# Patient Record
Sex: Female | Born: 1960 | Race: White | Hispanic: No | Marital: Married
Health system: Western US, Academic
[De-identification: ages and names within clinical notes are randomized; demographics above are authoritative.]

## PROBLEM LIST (undated history)

## (undated) ENCOUNTER — Inpatient Hospital Stay: Payer: BLUE CROSS/BLUE SHIELD | Attending: Family

## (undated) DIAGNOSIS — M549 Dorsalgia, unspecified: Secondary | ICD-10-CM

## (undated) DIAGNOSIS — M542 Cervicalgia: Secondary | ICD-10-CM

## (undated) DIAGNOSIS — E785 Hyperlipidemia, unspecified: Secondary | ICD-10-CM

## (undated) HISTORY — DX: Dorsalgia, unspecified: M54.9

## (undated) HISTORY — DX: Cervicalgia: M54.2

## (undated) HISTORY — PX: NO PAST SURGERIES: SHX2092

## (undated) HISTORY — DX: Hyperlipidemia, unspecified: E78.5

---

## 2012-12-27 ENCOUNTER — Ambulatory Visit

## 2012-12-31 ENCOUNTER — Encounter: Payer: Self-pay | Admitting: Family Medicine

## 2012-12-31 ENCOUNTER — Ambulatory Visit: Admitting: Family Medicine

## 2012-12-31 VITALS — BP 160/80 | HR 64 | Temp 96.0°F | Resp 12 | Ht 64.0 in | Wt 133.0 lb

## 2012-12-31 DIAGNOSIS — L719 Rosacea, unspecified: Secondary | ICD-10-CM | POA: Insufficient documentation

## 2012-12-31 DIAGNOSIS — M5030 Other cervical disc degeneration, unspecified cervical region: Secondary | ICD-10-CM | POA: Insufficient documentation

## 2012-12-31 DIAGNOSIS — E538 Deficiency of other specified B group vitamins: Secondary | ICD-10-CM | POA: Insufficient documentation

## 2012-12-31 NOTE — Nursing Note (Signed)
>>   ANDREA MAYES     Fri Dec 31, 2012  1:00 PM  Vitals Taken. Gaspar Bidding, [facility]

## 2012-12-31 NOTE — Progress Notes (Signed)
SUBJECTIVE:  Rebecca Warner is a(n) 52yr old female who presents today, 12/31/2012  Chief complaint:  establish.     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  Patient had a neck flare in 2009 and had an MRI at that time.  Patient had a flare about 2 weeks ago and is having pain on the left side.  She has seen physical therapy twice and would like to continue.  No trauma.  Patient is having some radicular symptoms down the left trapezius.  Pain is worse with extension.   Pain is worse with driving and walking.   No incontinence of bowel and bladder.   Patient has been on aspirin and flexeril.       Patient Active Problem List   Diagnosis    Rosacea    Low vitamin B12 level    Degenerative cervical disc     Current Medications  Outpatient Prescriptions Marked as Taking for the 12/31/12 encounter (Office Visit) with Stark Bray, MD   Medication Sig Dispense Refill    Cholecalciferol, Vitamin D3, (VITAMIN D-3) 2,000 unit Tablet Take  by mouth.        Cyanocobalamin (VITAMIN B-12) 500 mcg Lozenge Take  by mouth.        Doxycycline (VIBRAMYCIN) 50 mg Capsule Take 1 capsule by mouth 2 times daily. take with 8 oz of water  20 capsule      PROGESTERONE MISC          No Facility-Administered Medications for the 12/31/12 encounter (Office Visit) with Stark Bray, MD.       Pcn (Penicillin) (Penicillins)    Rash   reports that she has never smoked. She has never used smokeless tobacco. She reports that  drinks alcohol. She reports that she does not use illicit drugs.  family history includes Hypertension in her paternal grandmother; Non-contributory in her father; and Thyroid in her mother.    REVIEW OF SYTEMS:  NEUROLOGY:  no headaches, no memory loss, no syncope  ENT:  no hearing problems, no epistaxis,  no rhinitis  PULMONARY:  no cough, no dyspnea  CARDIAC:  no chest pain, no edema  GASTROINTESTINAL:  no appetite changes, no abdominal pains,  no  constipation, no diarrhea  UROLOGIC:  No frequency, no hematuria  RHEUMATOLOGIC:  no joint pains  DERMATOLOGY:  no rashes  GENERAL:  no fevers     OBJECTIVE:  BP 160/80  Pulse 64  Temp(Src) 35.6 C (96 F) (Oral)  Resp 12  Ht 1.626 m (5\' 4" )  Wt 60.328 kg (133 lb)  BMI 22.82 kg/m2  LMP 12/07/2012  General:  --no jaundice  --no respiratory distress  --does not appear acutely ill  Ears:  --TMs and canals: normal  Oropharynx--no obvious tooth or gum problems                     --no pharyngeal inflammation  Face:--no skin rashes  Nose: No external abnormalities  Neck:  --Supple  --no thyromegaly  --no anterior cervical adenopathy, no supraclavicular adenopathy    CHEST:  Inspection:  --no deformities  Lungs:  --Lungs are clear to ausculation  Card:  --RRR without murmur  Neck:  --Inspection:  No rashes   --Palpation: + left paraspinal tenderness, no masses  --Range of Motion: decreased for most movement  --DTRs: 1+ right biceps and 0 left biceps  --strength: 5/5 throughout both both arms  MRI 2009  IMPRESSION:  1. Central disc extrusion with cephalad migration at C4 does  not result in cord compression. Uncovertebral  degeneration results in narrowing of the left neural  foramen.  2. Broad based left paracentral/foraminal disc protrusion at  C5-6 causes mild narrowing of the left neural foramen.No  cord compression.Mild spinal stenosis is present at this  level.  3. At C6-7, mild spinal stenosis results in diffuse disc  ridge complex.The neural foramina are mildly narrowed  bilaterally, as well.    ASSESSMENT AND PLAN:  Elevated BP--start home monitoring, follow up for CPE.   Consider diuretic if not improving    Cervical disc disease--physical therapy referral.  Follow up if not improving.  Patient wants to go to [facility] physical therapy and has a ppo.    hcm--follow up for CPE    Barriers to Learning: none    Stark Bray, M.D.  Family Physician  [facility]  [facility]

## 2013-02-14 ENCOUNTER — Telehealth: Payer: Self-pay | Admitting: Family Medicine

## 2013-02-14 ENCOUNTER — Ambulatory Visit

## 2013-02-14 NOTE — Telephone Encounter (Signed)
Pt notified with voice message as per Dr Mat Carne.  Asked that she call back to schedule appt.

## 2013-02-14 NOTE — Telephone Encounter (Signed)
Neck braces are not recommended as they lead to stiffness and weakness.  I recommend patient follow up with me to re-evaluate--please schedule a follow up appt

## 2013-02-14 NOTE — Telephone Encounter (Signed)
Patient called back in and stated she was returning our call and I made appointment for the patient for 5/6.    No actions needed.    Thanks,  Caprice Laqueta Due II  [facility]

## 2013-02-14 NOTE — Telephone Encounter (Signed)
Patient called regarding her neck injury she saw PCP for on 12/31/12. Patient stated she has been going to physical therapy since then. Patient stated her physical therapist suggested she walk to help build up the muscle by her holding her neck up. Patient has been walking and her neck is still bothering her while walking as she has to hold her neck up. Patient stated her pain is also usually on the left side of her neck but now it has began to bother her right side of her neck. Patient is wondering if it would be a good idea if she gets a neck brace while walking, she would also like to know if she should come in to be seen for the pain on her right side. Please advise.    Thank you,  Maree Krabbe

## 2013-02-15 ENCOUNTER — Encounter: Payer: Self-pay | Admitting: Family Medicine

## 2013-02-15 ENCOUNTER — Ambulatory Visit: Admitting: Family Medicine

## 2013-02-15 VITALS — BP 144/100 | HR 80 | Temp 98.0°F | Resp 16 | Wt 133.1 lb

## 2013-02-15 NOTE — Patient Instructions (Addendum)
MRI ordered, the phone number is [phone].  Patients need to call and schedule themselves.

## 2013-02-15 NOTE — Progress Notes (Signed)
SUBJECTIVE:  Rebecca Warner is a(n) 52yr old female who presents today, 02/15/2013  Chief complaint:  Neck pain.     History: patient has had chronic neck pains.  Patient had an MRI around 5 years ago.  Patient does feels better but is having pain down the left arm radiating down her 5th finger.  She has been having some pain on the right side.  Patient has been icing and doing traction and doing her exercises.   Patient has much stress.  Patient has been checking her BP and it has been around 130/80 at home.  Patient is taking her B12 daily    Patient Active Problem List   Diagnosis    Rosacea    Low vitamin B12 level    Degenerative cervical disc     Current Medications  Outpatient Prescriptions Marked as Taking for the 02/15/13 encounter (Office Visit) with Stark Bray, MD   Medication Sig Dispense Refill    Cholecalciferol, Vitamin D3, (VITAMIN D-3) 2,000 unit Tablet Take  by mouth.        Cyanocobalamin (VITAMIN B-12) 500 mcg Lozenge Take  by mouth.        PROGESTERONE MISC          No Facility-Administered Medications for the 02/15/13 encounter (Office Visit) with Stark Bray, MD.       Pcn (Penicillin) (Penicillins)    Rash     OBJECTIVE:  BP 144/100  Pulse 80  Temp(Src) 36.7 C (98 F) (Oral)  Resp 16  Wt 60.374 kg (133 lb 1.6 oz)  BMI 22.84 kg/m2  LMP 02/01/2013  General:  --no jaundice  --no respiratory distress  --does not appear acutely ill  Neck:  --Inspection:  No rashes   --Palpation: diffuse tenderness, no masses  --Range of Motion: supple with normal flexion and extension  --DTRs: symmetrical biceps  --strength: 5/5 throughout both both arms'    ASSESSMENT AND PLAN:  Chronic neck pain with radicular symptoms--I recommend patient get a repeat MRI and consider referral to pain management.     Elevated BP--check labs and follow up for CPE    Low b12--check labs    Barriers to Learning: none    Stark Bray, M.D.  Family Physician  [facility]  [place] [facility]

## 2013-02-16 ENCOUNTER — Encounter: Payer: Self-pay | Admitting: Family Medicine

## 2013-02-18 ENCOUNTER — Telehealth: Payer: Self-pay | Admitting: Family Medicine

## 2013-02-18 NOTE — Telephone Encounter (Signed)
I have not received any forms and no documentation of patient dropping off any forms.  Please advise. Littie Deeds, MA1

## 2013-02-18 NOTE — Telephone Encounter (Signed)
Left message to call.  OPERATOR: please relay message to patient.  I do not know of any other forms other than the physical therapy forms.  Thank you, Littie Deeds, MA1

## 2013-02-18 NOTE — Telephone Encounter (Signed)
Patient called and stated that she has seen Dr Mat Carne for an issue with her neck. She dropped off some forms about 2-3 weeks ago and would like to know what the status is of them. Stated that it was just documentation and forms stating that she has an injury. Please call and advise.     Carrington Clamp  [facility]

## 2013-02-18 NOTE — Telephone Encounter (Signed)
The only paperwork/forms we have for patient are for physical therapy and these were completed and faxed to [facility] physical therapy on 02/01/13.  No answer at the number provided, no voicemail.  Littie Deeds, MA1

## 2013-02-18 NOTE — Telephone Encounter (Signed)
See outside records in Media tab 4/23--I think this is what she is talking about

## 2013-02-21 NOTE — Telephone Encounter (Signed)
I spoke with Sedalia Muta, she does not have any forms for this patient.   Left message to call.   OPERATOR:  Please notify patient, we have no forms, other than the physical therapy forms.  Please ask patient to bring in forms today or tomorrow as Dr. Mat Carne is off 5/14-5/21/14.  Thank you, Littie Deeds, MA1

## 2013-02-21 NOTE — Telephone Encounter (Signed)
Patient called back stating that she came in about 2-3 weeks ago and dropped a packet off to [place] at front desk and was told she would give to physician. Patient states that if unable to find the forms to leave message on patients voicemail and patient will fax it over tomorrow morning.    Please call back please advise

## 2013-02-21 NOTE — Telephone Encounter (Signed)
Lyla Son, do you have these forms?  No telephone encounter that forms were received.  Littie Deeds, MA1

## 2013-02-22 NOTE — Telephone Encounter (Signed)
Form in MD inbox for completion/signature. Colbe Viviano, M.A. I

## 2013-02-22 NOTE — Telephone Encounter (Signed)
No answer at home number.  OPERATOR:  Please advise patient that the form we received today by fax was indeed the form Dr. Mat Carne completed and faxed on 02/02/13.  A copy of the form is at the front desk if patient wants a copy.  Littie Deeds, MA1

## 2013-02-22 NOTE — Telephone Encounter (Signed)
Patient called in and stated she was checking on her paperwork that she had faxed over and the following was advised:    -->Please advise patient that the form we received today by fax was indeed the form Dr. Mat Carne completed and faxed on 02/02/13. A copy of the form is at the front desk if patient wants a copy. Littie Deeds, MA1    Patient stated she verbally understood.    No other actions needed    Thanks,  Caprice Vickii Chafe  [facility]

## 2013-02-22 NOTE — Telephone Encounter (Signed)
I filled this out on 4/23--see the media section

## 2013-03-01 ENCOUNTER — Ambulatory Visit: Payer: Self-pay

## 2013-03-04 ENCOUNTER — Telehealth: Payer: Self-pay | Admitting: Family Medicine

## 2013-03-04 NOTE — Telephone Encounter (Signed)
Faxed. Nasser Ku, MA1

## 2013-03-04 NOTE — Telephone Encounter (Signed)
Please see message from 5/09:    Patient called stating that she would like to know if the MA can please send a fax of form that's at front desk ATTN: Beverlyn Roux Fax: [phone] as soon as possible since they told patient that they haven't received it yet, and patient will pick up hard copy this afternoon.    Please call back if needed

## 2013-03-15 ENCOUNTER — Ambulatory Visit: Payer: BC Managed Care – PPO | Admitting: Family Medicine

## 2013-03-15 ENCOUNTER — Other Ambulatory Visit: Payer: Self-pay | Admitting: Family Medicine

## 2013-03-15 ENCOUNTER — Telehealth: Payer: Self-pay | Admitting: Family Medicine

## 2013-03-15 NOTE — Telephone Encounter (Signed)
The spine clinic has orthopedic as well as neurosurgical spine surgeons.  Patient can request one or the other if she desires but they are all good.  Please notify

## 2013-03-15 NOTE — Telephone Encounter (Signed)
Patient notified, MRI report and sign up letter for MyChart at front desk.  Patient given number to spine clinic.  Littie Deeds, MA1

## 2013-03-15 NOTE — Telephone Encounter (Signed)
Patient called and stated that she received a call this morning about her MRI results. She would like a cope of the report at the front desk so that she can pick it up. She was advised that she was referred to a spine specialist. Would like to make sure that she is referred to a neurosurgeon. Please call and advise.     Carrington Clamp  [facility]

## 2013-03-25 ENCOUNTER — Ambulatory Visit: Payer: Self-pay

## 2013-04-07 ENCOUNTER — Encounter: Payer: Self-pay | Admitting: Family Medicine

## 2013-04-07 ENCOUNTER — Ambulatory Visit: Payer: BC Managed Care – PPO | Admitting: Family Medicine

## 2013-04-07 ENCOUNTER — Telehealth: Payer: Self-pay | Admitting: Family Medicine

## 2013-04-07 ENCOUNTER — Ambulatory Visit: Payer: Self-pay

## 2013-04-07 VITALS — HR 72 | Temp 98.7°F | Resp 18 | Wt 133.2 lb

## 2013-04-07 NOTE — Nursing Note (Signed)
>>   VIRGINIA BRADFORD, MA     Thu Apr 07, 2013  3:54 PM  Vitals taken, allergies verified, screened for pain. [place], [place]

## 2013-04-07 NOTE — Progress Notes (Signed)
SUBJECTIVE:  Rebecca Warner is a(n) 52yr old female who presents today, 04/07/2013  Chief complaint:  Neck pain.     History: patient with neck pain since last visit.  Patient has been to physical therapy.  Patient will be going to the spine clinic in a couple of weeks.  The pain was so severe she broke into tears.   Patient is having left arm weakness.  Patient has some right arm symptoms.   Patient only taking aspirin at this point.  No incontinence, gait is normal.  Patient states her physical therapy has noticed that her left arm is getting weaker    Patient Active Problem List   Diagnosis    Rosacea    Low vitamin B12 level    Degenerative cervical disc     Current Medications  Outpatient Prescriptions Marked as Taking for the 04/07/13 encounter (Office Visit) with Stark Bray, MD   Medication Sig Dispense Refill    B Complex Vitamins (B-50 COMPLEX) Tablet Take  by mouth every morning.  30 tablet      Cholecalciferol, Vitamin D3, (VITAMIN D-3) 2,000 unit Tablet Take  by mouth.        Cyanocobalamin (VITAMIN B-12) 500 mcg Lozenge Take  by mouth.        PROGESTERONE MISC          No Facility-Administered Medications for the 04/07/13 encounter (Office Visit) with Stark Bray, MD.       Pcn (Penicillin) (Penicillins)    Rash     OBJECTIVE:  BP   Pulse 72  Temp(Src) 37.1 C (98.7 F) (Oral)  Resp 18  Wt 60.419 kg (133 lb 3.2 oz)  BMI 22.85 kg/m2  LMP 04/07/2013  General:  --no jaundice  --no respiratory distress  --does not appear acutely ill  Neck:  --Inspection:  No rashes   --DTRs: symmetrical biceps  Right triceps 2+, left is 1+  --strength: 5/5 throughout both both arms    ASSESSMENT AND PLAN:  Cervical radiculopathy--will chenge referral to ASAP since patient is worried and worsening.  I discussed narcotic and muscle relaxants but patient declines--she states she does have a muscle relaxant at home and I encouraged her to use it for a few nights to see if she could get a good night's  sleep.    Barriers to Learning: none    Stark Bray, M.D.  Family Physician  [facility]  [place] [facility]

## 2013-04-07 NOTE — Telephone Encounter (Signed)
LEFT ARM NUMBNESS/LEFT ARM PAIN/UNABLE TO LIFE ARM

## 2013-04-07 NOTE — Telephone Encounter (Signed)
Rebecca Warner is a 52yr old female    3 patient identifiers used.    Per:   patient    Reason for Call:  Neck/arm problem    Symptoms:    Patient states she has history of cervical spine pain and has a "ruptured disc."  Patient states arm pain and numbness for last 4 months.  Patient states she has an appointment with the neurosurgeon on 7/14.  Patient reports having 50% decreased strength in her left tricep and is currently in Physical therapy.   Patient reports left arm with decreased strenght for the last 10 days. Last night patient reports it was difficult picking dinner plate with her left hand and plate felt very heavy.  Patient states she able to move all of her fingers and arm without difficulty.  Patient requesting to be seen by neurologist sooner.       Homecare and/or Medications given:  observation    Advice:  Appt made today for evaluation with PCP at 1545.  Advised patient to call back prior to appointment with any worsening symptoms or concerns.    Pain: yes    Pain location and 1-10:  arms           Disposition: Appointment given per [facility] Numbness and tingling protocol    Per:   patient verbalizes agreement to plan. Agrees to callback with any increase in symptoms/concerns or questions.     Gerre Couch RN  [facility] T[facility]

## 2013-04-08 ENCOUNTER — Telehealth: Payer: Self-pay | Admitting: Family Medicine

## 2013-04-08 MED ORDER — CARISOPRODOL 350 MG TABLET: ORAL_TABLET | ORAL | Status: AC

## 2013-04-08 MED ORDER — HYDROCODONE 5 MG-ACETAMINOPHEN 325 MG TABLET: 1.0000 | ORAL_TABLET | Freq: Four times a day (QID) | ORAL | Status: DC | PRN

## 2013-04-08 NOTE — Telephone Encounter (Addendum)
Prescriptions faxed.  Left message to call.  OPERATOR:  Please relay MD's message to patient.  Thank you, Littie Deeds, MA1

## 2013-04-08 NOTE — Telephone Encounter (Signed)
Patient called in stating she had an office visit with Dr. Mat Carne yesterday regarding pain in her neck. Patient explained she was asked if she wanted pain medication and declined, however patient would like to be given something for pain due to how severe the pain has gotten since appointment yesterday. Patient would like a call to see what medication would be prescribe and states she does not want anything that will make her 'loopy'.     Please call, Please fill    Thank you   Theador Hawthorne

## 2013-04-08 NOTE — Telephone Encounter (Signed)
Prescription in MD's pink folder to be signed.

## 2013-04-08 NOTE — Telephone Encounter (Signed)
Both the muscle relaxant (soma) and the narocotic can make patient loopy and it would be best to take them in the evening.  During the day patient can try ibuprofen (motrin) or aleve (naprosyn)  --please notify

## 2013-04-09 MED ORDER — HYDROCODONE 5 MG-IBUPROFEN 200 MG TABLET: 1.0000 | ORAL_TABLET | Freq: Three times a day (TID) | ORAL | Status: DC | PRN

## 2013-04-09 NOTE — Telephone Encounter (Signed)
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  She wants ibuprofen with codeine.     Prescription called in.

## 2013-04-11 ENCOUNTER — Ambulatory Visit: Payer: BC Managed Care – PPO | Admitting: Neurological Surgery

## 2013-04-11 VITALS — BP 159/91 | HR 85 | Temp 98.1°F | Resp 14 | Ht 64.0 in | Wt 135.1 lb

## 2013-04-11 NOTE — Nursing Note (Signed)
>>   Rebecca Warner     Mon Apr 11, 2013  9:42 AM  Identified patient using name and date of birth.  Vital signs were taken.  Screened for pain.  Allergies verified.  Pharmacy was updated.  Rebecca Karie Mainland) [place], [place] II

## 2013-04-11 NOTE — Progress Notes (Signed)
RE:  Rebecca, Warner  MR#:  [id number]  DOB:  1960/10/24  Date of Service:  04/11/2013      Stark Bray, MD  [address] [place], [place]  [address]    Dear Dr. Mat Carne:    We had the pleasure of examining your charming patient, Rebecca Warner.  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  Rebecca Warner had conservative measures which  have been only partially beneficial.  The discomfort is in the center  of the posterior cervical area radiating to the left upper extremity.  She is aware of weakness of the left triceps group.  She tilts her  head towards the right, opposite the extremity with discomfort.    The past medical history is remarkable for C-sections x2.  The patient  is allergic to penicillin.  She takes vitamins and aspirin per day.  The patient does not smoke cigarettes.    On physical examination, Rebecca Warner stands 5 feet 4 inches tall,  weighing 135 pounds, blood pressure is 159/91, the pulse 85,  respirations 14, temperature is 98.1.  The face is symmetric,  extraocular muscles are full.  Active range of motion of the cervical  spine is limited by discomfort with head turned tilting towards the  right.  There is no tenderness on palpation of the paraspinous  muscles.  Strength is 5/5 to the grips, interossei, wrist extensors  and biceps; triceps are 5/5 to the right, 4+/5 on to the left.  There  are 5/5 to the iliopsoas, quadriceps, dorsiflexors and plantar  flexors.  Deep tendon reflexes are trace throughout the upper  extremities without Hoffmann's reflexes. The Babinski signs are absent  to the right and left.  The Romberg test is negative.  The tandem gait  is stable.    Rebecca Warner had a magnetic resonance scan on 03/12/2013 examined by Dr.  Selena Batten and demonstrated to the patient and her husband these reveal a  left-sided protruding disc at the C5-C6 level with a central larger  disk  herniation at the C6-C7 level producing bilateral and central  stenosis.  Discussion was entertained of conservative versus operative  treatments.  The patient is recommended to conservative treatment at  the present time.  She is provided with an urgent referral to the Pain  Service for epidural steroid injections.  She is also recommended to  have four-view cervical x-rays today which will be reviewed next at  her appointment in three months.    Sincerely yours,      Report Electronically Signed - 04/15/2013 10:45:21 by  Hazeline Junker, MD  Clinical Fellow  Neurosurgery    Report Electronically Signed - 04/13/2013 09:46:14 by  Fernande Bras, MD  Associate Professor and Chief Of Spinal Neurosurgery        PR/hh  D:  04/11/2013 11:12:19 PDT/PST  T:  04/11/2013 16:47:41 PDT/PST  Job #:  [id number] / [id number]

## 2013-04-25 ENCOUNTER — Ambulatory Visit: Payer: BC Managed Care – PPO | Admitting: Neurological Surgery

## 2013-04-28 ENCOUNTER — Ambulatory Visit: Payer: BC Managed Care – PPO | Attending: ANESTHESIOLOGISTS | Admitting: ANESTHESIOLOGISTS

## 2013-04-28 ENCOUNTER — Encounter: Payer: Self-pay | Admitting: ANESTHESIOLOGISTS

## 2013-04-28 VITALS — BP 164/84 | HR 75 | Temp 98.4°F | Resp 16 | Ht 63.75 in | Wt 136.0 lb

## 2013-04-28 DIAGNOSIS — M5412 Radiculopathy, cervical region: Secondary | ICD-10-CM | POA: Insufficient documentation

## 2013-04-28 NOTE — Progress Notes (Signed)
[facility]  Department of Anesthesiology and Pain Medicine  [address]  [place], [place] [address]  Phone: [phone]    Fax: [phone]    Date: April 28, 2013    Re: Rebecca Warner  MR#: [id number]  Date of Birth: 1961/07/22  Age: 38yr    Requesting physician: Stark Bray, MD    Below is the summary of the visit with our mutual patient, Rebecca Warner. Thank you for your cooperation in her care.     Assessment and Differential Diagnosis:  Rebecca Warner was seen today for arm pain.    Diagnoses and associated orders for this visit:    Cervical radicular pain           Encounter Summary:  Rebecca Warner is a 52yr -old female with acute onset of left upper extremity pain 12/15/12.  There was no inciting event that the patient recalls.  The patient notes that there was moderate improvement with physical therapy until 03/27/13 when it worsened greatly.  The patient states that she "woke up with it-acute onset on 12/15/12.".  The patient states "I wonder if the extended disc moved or enlarged at that time."      RECOMMENDATIONS/TREATMENT PLAN:     1. cervical epidural steroid injection next avail PK        Dear Dr. Stark Bray, MD    It was a pleasure to see your patient, Rebecca Warner, today in consultation at the [facility], [facility].  As you know, Rebecca Warner is a 52yr -old female with a chief complaint of:    Chief Complaint   Patient presents with    Arm Pain     left       Rebecca Warner is here for a new problem of left upper extremity pain, which started 4 month(s) ago. She associates the onset of pain with no known inciting event or injury.    She notes pain located in the areas indicated in the pain location diagram below.     Pain Location Diagram        The pain is present frequently (75% of the time)    Rebecca Warner describes the pain as burning, pins and needles, sharp, numbness, shooting, cutting and  pressure    Pain Intensity (VAS): 0-10 Scale (0= No Pain, 10= Worst Imaginable Pain)  She states that the pain intensity is:   Currently a VAS of 8/10.     The average pain for the last week has been a VAS of 8/10.     At its best, it is 3/10;    At its worst, 9/10.    RELIEVING AND AGGRAVATING FACTORS   Her pain is relieved by medication.   Her pain is aggravated by sitting, coughing/sneezing.   Her pain is unchanged by relaxation, thinking about something else, urination, bowel movements.      FUNCTIONAL LIMITATIONS:   Rebecca Warner states that pain   does not interfere with caring for self;   occasionally interferes with going to work, exercising or recreation, sleeping;    often interferes with performing household chores, shopping, socializing with friends, exercising or recreation, driving;    and completely interferes with doing yard work.    Currently Rebecca Warner is    able to walk a mile or more, before pain becomes the limiting factor;    sit for 1  hours before pain becomes the limiting factor;  and stand for 2 hours before pain becomes the limiting factor.      Rebecca Warner is forced to lie down occasionally because of pain.    Outpatient Prescriptions Marked as Taking for the 04/28/13 encounter (Office Visit) with Carmelina Dane, MD   Medication Sig Dispense Refill    B Complex Vitamins (B-50 COMPLEX) Tablet Take  by mouth every morning.  30 tablet      Carisoprodol (SOMA) 350 mg Tablet 1-2 tabs po qhs prn  20 tablet  0    Cholecalciferol, Vitamin D3, (VITAMIN D-3) 2,000 unit Tablet Take  by mouth.        Cyanocobalamin (VITAMIN B-12) 500 mcg Lozenge Take  by mouth.        Hydrocodone-Ibuprofen (VICOPROFEN) 7.5-200 mg per tablet Take 1 tablet by mouth every 8 hours if needed for pain.  30 tablet  0    PROGESTERONE MISC          No Facility-Administered Medications for the 04/28/13 encounter (Office Visit) with Carmelina Dane, MD.       The patient is currently not taking opioid  medications.    STOPPED PAIN MEDICATIONS:   Med #1 - Vicodin, reason for stopping - Didn't help    Allergies   Allergen Reactions    Acetaminophen Other-Reaction in Comments     No allergy; patient refuses to take.    Pcn (Penicillin) (Penicillins) Rash       PREVIOUS DIAGNOSTIC STUDIES:   Images: The report of the image indicated below was reviewed. and The image noted below was viewed.  MRI of cervical spine      OTHER PAIN PROBLEMS:  The patient does NOT report pain in other areas.    LEGAL ISSUES:    Rebecca Warner is not currently involved in litigation pertaining to the pain complaint.     She does not have a prior history of arrests or legal problems.     She has not filed a Radiation protection practitioner related to the pain complaint.    PSYCHOLOGICAL HISTORY AND TREATMENTS:      Rebecca Warner has not been evaluated or treated by a psychiatrist, psychologist, or counselor prior to todays visit.       DEPRESSION SCALE (Patient Health Questionnaire PHQ-9)  1. Little interest or pleasure in doing things: 0  2. Feeling down, depressed, or hopeless: 0  3. Trouble falling or staying asleep, or sleeping too much: 0  4. Feeling tired or having little energy: 0  5. Poor appetite or overeating: 0  6. Feeling bad about yourself - or that you are a failure or have let yourself or your family down: 0  7. Trouble concentrating on things, such as reading the newspaper or watching television: 0  8. Moving or speaking so slowly that other people could have noticed. Or the opposite - being so figety or restless that you have been moving around a lot more than usual: 0  9. Thoughts that you would be better off dead, or of hurting yourself: 0  If >  1: Please notify a mental health provider or the patients primary care provider with any concerns about acute risk for suicide.   10. If you checked off any problems, how difficult have these problems made it for you to do your work, take care of things at home, or get along with other  people?: Not difficult at all  PHQ9 Total: 0     PHQ9 Affective-Cognitive Total: 0/15  PHQ9 Physical Total: 0/12        Total Score Depression Severity   1-4 Minimal Depression   5-9 Mild Depression   10-14 Moderate Depression   15-19 Moderately Severe Depression   20-27 Severe Depression        She does not have a past history of suicide attempts.     Rebecca Warner psychological, depression, and suicidality history were reviewed.  The patient appears to be at low acute risk for suicide or self harm at this time.    EFFECT OF PAIN ON EMPLOYMENT   Rebecca Warner employment status (see below) has not  been affected by the present pain condition.     She is not currently unemployed due to the painful condition.    PAST MEDICAL, SURGICAL, FAMILY, SOCIAL HISTORY, and REVIEW OF SYSTEMS:      Past Medical History   Diagnosis Date    Back pain        Past Surgical History   Procedure Laterality Date    Pr cesarean delivery only       C-section, low cervical x 2       Family History   Problem Relation Age of Onset    Thyroid Mother      goiter as a child    Non-contributory Father     Hypertension Paternal Grandmother     Cancer Maternal Grandmother     Heart Paternal Grandfather          PSYCHOSOCIAL HISTORY:     History     Social History    Marital Status: MARRIED     Spouse Name: N/A     Number of Children: 2    Years of Education: N/A     Occupational History    Health visitor      Social History Main Topics    Smoking status: Never Smoker     Smokeless tobacco: Never Used    Alcohol Use: Yes      1 glass at night    Drug Use: No    Sexually Active: Not on file     Other Topics Concern    Not on file     Social History Narrative    PREVIOUS TREATMENTS:     Excellent pain relief was achieved with none.    Moderate pain relief was achieved with traction, physical therapy.    No pain relief was achieved with traction, physical therapy, exercise.        PAST MEDICAL, SOCIAL, AND FAMILY HISTORY:      Review of  Systems:         was asked to report if he was experiencing any of the following:        fever or chills     unplanned weight loss     double or blurred vision     hearing loss     difficulty swallowing     bleeding gums     low platelet count     heat intolerance     cold intolerance     thyroid problems     skin rash     shortness of breath     wheezing     palpitations (awareness of fast heart)     chest pain     constipation     abdominal pain     nausea    vomiting     diarrhea     sexual dysfunction     urinary  retention or difficulty urinating     back pain     neck pain    joint pain     muscle pain     loss of consciousness or blackouts     memory loss     muscle weakness     seizures     trouble walking     dizziness     drowsiness      excessive fatigue     difficulty falling or remaining asleep     loss of interest in hobbies or other activities     difficulty concentrating     feelings of guilt     feeling depressed         THE PATIENT ADMITS TO THE FOLLOWING OF THE ABOVE LISTED SYMPTOMS:  neck pain        All other systems were negative.        The Review of Systems was reviewed.                FAMILY LIVING CIRCUMSTANCES:          The patient is currently lives with their spouse                           Rebecca Warner past medical, family, and social history were reviewed.    PHYSICAL EXAM:    Filed Vitals:    04/28/13 1015   BP: 164/84   Pulse: 75   Temp: 36.9 C (98.4 F)   TempSrc: Oral   Resp: 16   Height: 1.619 m (5' 3.75")   Weight: 61.689 kg (136 lb)   SpO2: 99%     Constitutional: Normally developed, no deformities,well groomed., healthy, alert, no distress, pleasant affect, cooperative, skin warm, dry, and pink  Skin:  Skin color, texture, turgor normal. No rashes or lesions.  Eyes:  conjunctivae and corneas clear. PERRL, EOM's intact. sclerae normal.  Ears:  external inspection of ears show no abnormality.  Nose:  normal.  Mouth: normal.  Respiratory: clear to auscultation.  Cardiovascular:   normal rate and regular rhythm, no murmurs, clicks, or gallops.  GI: BS normal.  Abdomen soft, non-tender.  No masses or organomegaly.  Musculoskeletal: Gait/Station is not antalgic, Patient is able to heel walk and is able to toe walk  Neuro: Movement Assessment Upper Limb  Bulk:   normal  Tone:  normal  Abnormal Movements:  no    Strength Movement Root Nerve Muscle   Bilateral 5 Shoulder abduction C5/6 Axillary Deltoid   Bilateral 5 Elbow flexion C5/6 Musculocut., Radial Biceps, Brachioradialis   Bilateral 5 Elbow extension C6/7/8 Radial Triceps   Bilateral 5 Wrist extension C5/6 Radial Ext. carpi rad. longus   Bilateral 5 Finger extension C7/8 Post. Interos. N. (radial) Ext. dig. comm.   Bilateral 5 Finger flexion (index) C7/8 Ant. Interos. N. (median) Flexor dig. Prof. (index)   Bilateral 5 Finger flexion (ring, little) C7/8 Ulnar Flex. dig. prof. (ring + little)   Bilateral 5 Finger abduction C8/T1 Ulnar 1st dorsal interos.   Bilateral 5 Thumb abduction C8/T1 Median ABD. Pol. brevis       Upper extremity reflexes: biceps - bilateral 2+  brachioradialis - bilateral 2+  triceps - bilateral 2+  Hoffman's sign is not present bilateral ; Wrist clonus is not present bilateral .  Sensory Assessment:  Sensory:  bilateral  upper extremities are intact and symmetrical light touch, position.  There is no .  allodynia in the bilateral upper extremity  Psych:Attention Span: good  Speech: normal volume, rate, and pitch  Mood (pt's report) :Mood pt's report, euthymic  Affect: full and appropriate  Cervical Spine:range of motion is full with flexion and is associated with no change in pain.  range of motion is severely restricted with extension and is associated with increase in pain.  Myofascial exam:The patient did  have myofascial tenderness in the left cervical paraspinous muscle regions    MEDICAL DECISION MAKING     Records Reviewed:   Images viewed/reviewed above were important and necessary because subsequent medical  and treatment recommendations required review of the above image(s)  The report by Dr. Selena Batten dated June 2014 was reviewed.  This was important and useful because the reviewed note clarified the reasons for the recommended treatment, subsequent medical recommendations were based upon the record review and the patient's history was corroborated in the reviewed note.     Review of the Risk of Comorbidities:  At this juncture, we believe that the patient's constitutional status adds moderate risk and complexity to our proposed evaluation and treatment.  High Risk procedures ordered:  Cervical Injection   Other considerations in this patient's management include Rosacea    ASSESSMENT AND RECOMMENDATIONS/TREATMENT PLAN:   Please see the beginning of the note.       I saw and evaluated the patient myself.  The patient was instructed and educated on all aspects of the plan of care.  The patient acknowledged the plan of care.    Carmelina Dane, MD  Attending Physician  Department of Anesthesiology & Pain Medicine    Note Electronically Signed By:  Carmelina Dane, MD

## 2013-04-28 NOTE — Nursing Note (Signed)
>>   Rebecca Warner     Thu Apr 28, 2013 10:16 AM  Identified patient using name and date of birth.  Vital signs were taken.  Screened for pain.  Allergies verified.  Pharmacy was updated.  Rebecca Karie Mainland) [place], [place]

## 2013-05-11 ENCOUNTER — Ambulatory Visit: Payer: BC Managed Care – PPO | Attending: ANESTHESIOLOGISTS | Admitting: ANESTHESIOLOGISTS

## 2013-05-11 VITALS — BP 181/90 | HR 88 | Temp 98.1°F | Resp 16 | Ht 64.0 in | Wt 134.0 lb

## 2013-05-11 DIAGNOSIS — M5020 Other cervical disc displacement, unspecified cervical region: Secondary | ICD-10-CM | POA: Insufficient documentation

## 2013-05-11 NOTE — Progress Notes (Signed)
Informed Consent:  The patient's condition and proposed procedures, risks, and alternatives were discussed with the patient or responsible party.  The patient's / responsible party's questions were answered.   The patient / responsible party appeared to understand and chose to proceed.  Informed consent was obtained.    Procedural Pause:  A procedural pause verifying correct patient, medical record number, allergies, and surgical site was performed immediately prior to beginning the procedure.    I Hilbert Bible, MD) performed the entire procedure on the date indicated in Dr. James Ivanoff, the pain fellow's, note.  Please see EMR note for details.     I Hilbert Bible, MD) evaluated and examined the patient with the pain fellow on the date indicated in the fellow's note.  We developed the plan together as outlined in the pain fellow's note.   The patient was instructed and educated on all aspects of the plan of care. The patient acknowledged the plan of care.    Carmelina Dane, MD  Attending Physician  Department of Anesthesiology & Pain Medicine  Note Electronically Signed By:  Karmen Bongo, MD  Medical Director, Division of Pain Medicine  Professor of Anesthesiology and Pain Medicine

## 2013-05-11 NOTE — Procedures (Signed)
Procedure:  Cervical, C7-T1, Midline Interlaminar Epidural Steroid Injection with Fluoroscopy    Epidural Steroid Injection # 1  Series # 1    Postprocedural Diagnosis: Same    Anesthesia - None  IV Fluids - None  Tuohy Needle Type: 20 gauge 3.5 inch Tuohy  Loss of Resistance Depth - 4.5 cm  Contrast Dye - iohexol 180 mgI/ml - 1 ml   Injected Steroid Solution: triamcinolone 40 mg and pf normal saline 1.5 ml  Additional Medications Administered: none    Estimated Blood Loss - <2 ml  Drains: None  Specimens Removed: None  Urine Output - Not Measured  Complications: no apparent complications  Outcome: good    Informed Consent:  The patient's condition and proposed procedures, risks, and alternatives were discussed with the patient or responsible party.  The patient's / responsible party's questions were answered.   The patient / responsible party appeared to understand and chose to proceed.  Informed consent was obtained.    After obtaining written consent, an IV hep lock was placed. (See nurses notes for details). The patient was taken back to the fluoroscopy suite and placed in a prone position with a pillow under the chest to decrease lordosis.  The skin overlying the injection site was prepped and draped in an aseptic fashion. The target vertebral interspace (see above) was identified by AP fluoroscopy.     Procedural Pause:  A procedural pause verifying correct patient, medical record number, allergies, and surgical site was performed immediately prior to beginning the procedure.    The skin and subcutaneous tissue overlying the target site of injection was anesthetized using 4 ml of 1% lidocaine MPF with a 25-gauge, 1 -inch needle.     The above noted Tuohy needle was advanced under fluoroscopic guidance towards the epidural space. Lateral fluoroscopic imaging was used to confirm depth. The epidural space was identified using a loss of resistance to air technique. (See above for loss of resistance depth). A  catheter was not placed. A microbore extension tubing was attached to the needle to minimize any movement of the needle during injection or syringe change.  After negative aspiration for heme or CSF, the above noted contrast was injected. An epidurogram was confirmed using AP and lateral fluoroscopy. After repeat negative aspiration for heme or CSF, the above noted steroid solution was slowly injected in increments. The needle was then retracted approximately halfway and the needle track was flushed with 0.5 ml of 1% lidocaine MPF to clear the needle prior to removal. The Tuohy needle was then removed. A sterile bandage was placed over the injection site.    The heart rate, pulse oximetry, and blood pressure were continuously monitored throughout the procedure.  The patient tolerated the procedure well. She was carefully escorted to the recovery room in stable condition.  After meeting discharge criteria, the patient was discharged home.    DISCUSSION:  Conditions of the spine that result in spinal nerve root irritation such as disc protrusions, spinal stenosis, or post surgical radiculitis often respond favorably to epidural steroid injections.  The goal in performing epidural steroid injections is to provide relief from pain and permit greater function.     Today we performed an interlaminar epidural steroid injection.  The patient was informed that it may take several days for the epidural steroid medication to reach its full efficacy and she should continue her regular pain medications as prescribed.    The patient was advised to relax and avoid any heavy  lifting or excessive bending for the rest of the day.  She was advised that she may return to her usual activities tomorrow if she is otherwise feeling well.  The patient was advised not to bathe or soak in water for 24 hours but that showering would be acceptable.    The patient was instructed that if she experienced fever or chills, new weakness, new sensory  changes, any changes in bowel or bladder habits, worsening back pain, new headache, neck stiffness, or other new symptoms, that she should contact the pain clinic immediately or dial 911 if unable to reach the pain clinic.   The patient has agreed not to travel out of the area for the next 4 days following the procedure so that she can be reevaluated if necessary.    RECOMMENDATIONS:  1. Repeating the injection may provide improved and/or prolonged pain relief and allow for better functional outcomes.  We will plan to have the patient follow up in 4-6 weeks for a repeat epidural steroid injection. If the patient is doing well prior to the next scheduled injection, the patient will contact us and reschedule to a later date..   2. The patient has agreed not to travel out of the area for the next 4 days following the procedure so that she can be reevaluated if necessary.  3. No medications were prescribed at today's visit.  4. Additional recommendations: None    The patient was instructed and educated on all aspects of the plan of care.  The patient acknowledged the plan of care.  I saw and evaluated the patient with my attending physician, Dr. Rockwell Germany, DO  Fellow  Department of Anesthesiology & Pain Medicine    Note Electronically Signed By:  Geri Seminole, DO

## 2013-05-11 NOTE — Patient Instructions (Addendum)
PAIN MANAGEMENT CLINIC  AFTER VISIT INSTRUCTIONS      Pain Clinic [phone] or [phone]: available during business hours, 8 am - 5 pm    On-call physician [phone]: available after business hours, 7 days per week including holidays).          Procedure Performed Today:     Epidural Steroid - Cervical  Fluoroscopy yes    If you experience any of the following symptoms after your procedure, please notify our office or on-call physician immediately (see above for contact information):    fever (increased oral temperature)    bleeding or swelling at the injection site,     drainage, rash or redness at the injection site     possible signs of infection     increased pain at the injection site    worsening of your usual pain    severe headache    new or worsening numbness     new arm and/or leg weakness, or     changes in bowel and/or bladder function: urinating or defecating on yourself and not knowing that you did it.      PLEASE FOLLOW ALL INSTRUCTIONS CAREFULLY      Do not drive or operate heavy machinery for 12 hours.    Do not engage in strenuous activity (e.g., lifting or pushing heavy objects or repeated bending) for 24 hours.      Do not take a bath, swim or use Jacuzzi for 24 hours after procedure. (A shower is fine).    Remove any Band-Aids when you get home.     Use cold/ice, as needed for comfort.  We recommend the use of cold therapy alternating on for 20 minutes, off for 20 minutes.     Do not apply direct heat (heating pad or heat packs) to the injection site for 24 hours.      Resume your usual medications, unless instructed otherwise by your Pain Physician.      If you are on warfarin (Coumadin) or other blood thinner, resume this medication as instructed by your prescribing Physician.    Keep a record of your response to the injection you had today.      How much relief did you get?    Were you able to decrease the  use of any of your pain medications?    Were you able to increase your level of activity?    How long did the relief last?     NOTE:  If your Pain Physician ordered a procedure, lab, diagnostic study (x-ray, MRI, CT, ultrasound, EMG, EKG, etc.), or specialty referral for you today, please be aware that some insurance carriers may require authorization before they can be scheduled. If these cannot be scheduled at the time of discharge today, you will be contacted with the scheduled appointment.  If you are not contacted within 3-5 business days, please call our office at [phone].          Procedure To Schedule FOR YOUR Next Visit: Epidural Steroid - Cervical  Fluoroscopy yes      NOTE: Please be aware of which location you are scheduled for your next appointment. The [facility] has 2 locations in [place]: (1) the [facility] at the [facility] and (2) the [facility] at the [facility]. If you have any questions regarding the location of your next appointment, please call our office: [phone].      PLEASE FOLLOW ALL INSTRUCTIONS CAREFULLY    [facility]  Patient Pre-Procedure Instructions   CAUTION FAILURE TO FOLLOW THESE PRE-PROCEDURE INSTRUCTIONS WILL RESULT IN YOUR PROCEDURE   BEING RESCHEDULED.   PREGNANCY If you are pregnant, or think you may be pregnant, please notify our staff.  This may or may not affect the ability to perform the procedure.    DRIVER You MUST have a DRIVER TO TAKE YOU HOME after your procedure.    You must provide your drivers full name and contact number at time of check-in.   FASTING  PROTOCOL You may have nothing to eat for 8 hours prior to arrival at clinic.  Broth and candy are considered solid food and require an eight hour fast.  You may have CLEAR liquids up to 2 hours prior to arrival at clinic.   Clear liquids include water,  clear fruit juice (no pulp), carbonated beverages, ice, black coffee, black tea (no milk or cream), chewing gum (un-swallowed), and/or clear jello (no fruit or milk).  No alcohol containing beverages.    BLOOD  PRESSURE  MEDICATIONS If you take blood pressure medication do not stop.  Take your BP medications as usual the morning of your procedure with a sip of water at least 2 hours prior to arrival.   BLOOD  THINNERS If you are taking daily aspirin, Plavix, or other blood thinners such as Coumadin/Warfarin, we will need your prescribing doctor to sign a release permitting you to stop these medications.  Once approved by your prescribing doctor - Stop All blood thinners based on the time table below prior to your procedure.  If you have been instructed to stop warfarin (coumadin), you must have an INR lab drawn the day before your procedure. Your INR must be within normal limits before we can perform your injection. If you do not use a [facility] laboratory, please have the lab fax your results to the Pain Clinic: [phone]. Medications can be restarted after your procedure.    14 day hold                                         7  DAY HOLD                    Ticlid (Ticlopidine)                        Anacin, Bufferin, Ecotrin, Excedrin,                                                                   Aggrenox (Aspirin)   10 DAY HOLD                            Brilinta  (Ticagrelor)   Effient (Prasugel)                       Coumadin (Warfarin)                                                          Pradaxa (Dabigatran)                                                        Elmiron (Pentosan)                                                        Plavix (Clopidogrel Bisulfate)                                                        Pletal (Cilostazol)                                              3 DAY HOLD                                                        24 HOUR HOLD  Xarelto (Rivaroxaban)                                          Lovenox (Enoxaparin)                                                                                 Agrylin (Anagrelide)     Non-steroidal  Anti-inflammatories  (NSAIDs) DO NOT TAKE any non-steroidal anti-inflammatory medications (NSAIDS), listed in the time table below.  Medications can be restarted after your procedure.  Celebrex is OK to take and does not need to be discontinued.  Medications to stop:  3 DAY HOLD                                         7 DAY HOLD  Advil, Motrin, (Ibuprofen)                       Aleve (Naproxen sodium)                           Arthrotec (diclofenac sodium/              Darvon  compound (contains aspirin)         misoprostol)                                           Naprosyn (Naproxen)        Clinoril (Sulindac)                                  Norgesic Forte (contains aspirin)  Indocin (Indomethacin)                          Mobic (Meloxicam)                 Lodine (Etodolac)                                 Oruvail (Ketoprofen)                                   Toradol (Ketorolac)                                Percodan (contains aspirin)       Vicoprofen (Hydrocodone and             Relafen (Nabumetone)                                      Ibuprofen)                                               Salsalate       Voltaren (Diclofenac)                             Trilisate                                                                                                                 Vitamin E (more than 400mg per day)                                                                   Any medication containing aspirin            14 DAY HOLD                                                Daypro (Oxaprozin)           Feldene (Piroxicam)               ANTIBIOTICS If you are currently taking antibiotics, you must complete the entire dosage 7 days prior to your scheduled procedure.  You must be clear of any signs or symptoms of infection.  If you begin antibiotics,  please contact our clinic for instructions.   TRAVEL AFTER  PROCEDURE You cannot travel out of town for 4 days after your procedure.   FEVER, CHILLS   OR RASH If you experience any fever, chills, rash, or open wounds during the one week prior to your procedure please call the Pain Clinic.   *[facility] Point  Injections Need Driver Only.   You may take your medications (including blood pressure, blood thinners, aspirin and NSAIDS), eat, and drink as usual.     If you are SICK, it is not safe to do your procedure. Therefore, please call us as soon as possible to reschedule. Since we do keep a waiting list, your courtesy will allow us to schedule another patient. Please, call our office if you have any questions.    If you are NOT IN PAIN OR HAVE MINIMAL PAIN, please call to postpone your procedure (unless otherwise instructed by your Pain Physician).        MEDICATIONS    If your Pain Physician prescribes medications for you, please anticipate when you will require a refill.  It can take 1-3 business days to complete the process. Contact your pharmacy for medication refills. Your pharmacy will contact the Pain Clinic office with detailed medication information.         Driving When You Are Taking Medications    For most people, driving represents freedom, control and independence. Driving enables most people to get to the places they want or need to go. For many people, driving is important economically - some drive as part of their job or to get to and from work.   Driving is a complex skill. Our ability to drive or operate heavy or dangerous machinery safely can be affected by changes in our physical, emotional and mental condition. The goal of this brochure is to help you and your health care professional talk about how your medications may affect your ability to drive safely.     How can medications affect my driving or ability to operate heavy or  dangerous machinery?   People take medications for a variety of reasons. Those can include:          allergies          anxiety          colds          depression          diabetes          heart and cholesterol conditions          high blood pressure          muscle spasms          pain          Parkinson's disease          schizophrenia   Medications may be prescribed by your doctor or purchased over-the-counter without a doctor's prescription. Many individuals also take herbal supplements. Some of these medications and supplements may cause a variety of reactions that may make it more difficult for you to drive a car or operate heavy or dangerous machinery safely. These reactions may include:            sleepiness          blurred vision          dizziness          slowed movement          fainting          inability to focus or pay attention          nausea   Often people take more than one medication at a time. The combination of different medications can cause problems for some people. This is especially true for older adults because they take more medications than any other age group. Due to changes in the body as people age, older adults are more prone to medication related problems. The more medications you take, the greater your risk that your medicines will affect your ability to drive safely. To help avoid problems, it is important that at least once a year you talk to your doctor or pharmacist about all the medications - both prescription and over-the-counter - you are taking. Also let your professional know what herbal supplements, if any, you are taking. Do this even if your medications and supplements are not currently causing you a problem.     Can I still drive or operate heavy or dangerous machinery safely if I am taking medications?     Yes, most people can drive or operate heavy or dangerous machinery safely if they are taking medications. It depends on the effect those medications - both  prescription and over-the-counter - have on your coordination and mental function. In some cases you may not be aware of the effects. But, in many instances, your doctor can help to minimize the negative impact of your medications on your driving or operate heavy or dangerous machinery in several ways. Your doctor may be able to:          adjust the dose;          adjust the timing of doses or when you take the medication;          add an exercise or nutrition program to lessen the need for medication; and          change medication to one that causes less drowsiness.     What can I do if I am taking medications?   Talk to your doctor honestly.   When your doctor prescribes a medicine for you, ask about side effects. How should you expect the medicine to affect your ability to drive or operate heavy or dangerous machinery? Remind your doctor of other medications - both prescription and over-the-counter - and herbal supplements you are taking, especially if you see more than one doctor. Talking honestly with your doctor also means telling the doctor if you are not taking all or any of the prescribed medication. Do not stop taking your medication unless your doctor tells you to.   Ask your doctor if you should drive - especially when you first take a medication.   Taking a new medication can cause you to react in a number of ways. It is recommended that you do not drive or operate heavy or dangerous machinery when you first start taking a new medication until you know how that drug affects you. You also need to be aware that some over-the-counter medicines and herbal supplements can make it difficult for you to drive or operate heavy or dangerous machinery safely.     Talk to your pharmacist.   Get to know   your pharmacist. Ask the pharmacist to go over your medications with you and to remind you of effects they may have on your ability to drive or operate heavy or dangerous machinery safely. Be sure to request printed  information about the side effects of any new medication. Remind your pharmacist of other medicines and herbal supplements you are taking. Pharmacists are available to answer questions wherever you get your medications. Many people buy medicines by mail. Mail-order pharmacies have a toll-free number you can call and a pharmacist available to answer your questions about medications.   Monitor yourself.   Learn to know how your body reacts to the medications and supplements. Keep track of how you feel after you take the medication. For example, do you feel sleepy? Is your vision blurry? Do you feel weak and slow? When do these things happen?   Let your doctor and pharmacist know what is happening.   No matter what your reaction is to taking a medicine - good or bad - tell your doctor and pharmacist. Both prescription and over-the-counter medications are powerful-that's why they work. Each person is unique. Two people may respond differently to the same medicine. If you are experiencing side effects, the doctor needs to know that in order to adjust your medication. Your doctor can help you find medications that work best for you.     What if I have to cut back or give up driving?   You can keep your independence even if you have to cut back or give up on your driving due to your need to take medications. It may take planning ahead on your part, but it will get you to the places you want to go and the people you want to see. Consider:          rides with family and friends;          taxi cabs;          shuttle buses or vans;          public buses, trains and subways; and          walking.   Also, senior centers and religious and other local service groups often offer transportation services for older adults in the community.     Thank you for choosing [facility].

## 2013-05-11 NOTE — Progress Notes (Signed)
[facility]  Department of Anesthesiology and Pain Medicine  [address]  [place], [place] [address]  Phone: [phone]    Fax: [phone]    Date: May 11, 2013    Re: MARGART ZEMANEK  MR#: [id number]  Date of Birth: 1960/12/07  Age: 73yr    Procedure:  Epidural Steroid - Cervical    See Procedure Note for Discussion and Recommendations    Dear Dr. Stark Bray, MD    We had the pleasure of treating your patient, Rebecca Warner, today at the [facility].  As you know, the patient is a 52yr -old female with a chief complaint of:    Chief Complaint   Patient presents with    Neck Pain    Arm Pain       Rebecca Warner states that the pain problem started March 2014.     She notes pain located in the areas indicated in the pain location diagram below.     Pain Location Diagram        Rebecca Warner describes the pain as burning, pins and needles, sharp, numbness and cutting    Pain Intensity (VAS): 0-10 Scale (0= No Pain, 10= Worst Imaginable Pain)  She states that the pain intensity is:   Currently a VAS of 8/10.     The average pain for the last week has been a VAS of 7/10.      Patient Active Problem List   Diagnosis    Rosacea    Low vitamin B12 level    Degenerative cervical disc    Cervical radicular pain       RESULTS OF MOST RECENT PROCEDURE: The patient did not undergo a recent procedure.Marland Kitchen  NA      PREVIOUS DIAGNOSTIC STUDIES: The imaging results below were reviewed.   MRI of cervical spine    No results found for this basename: wbc,  hgb,  hct,  plt       No results found for this basename: na,  k,  cl,  co2,  bun,  cr,  glu       No results found for this basename: ast,  alt,  alp,  alb,  tp,  tbil       No results found for this basename: hgba1c       No results found for this basename: inr       No results found for this basename: bnp       No components found with this basename: troponin1        Rebecca Warner has not had an infection, fever,  or chills in the past 1 week.   Rebecca Warner has not taken any antibiotics in the past 1 week.     Rebecca Warner does not have a history of problems with anesthesia in the past.     Rebecca Warner had solid food greater than eight (8) hours ago.   Rebecca Warner had clear liquids equal to two (2) hours ago.     She was questioned regarding a history of stridor, snoring, or sleep apnea. Rebecca Warner states that she has a history of neither stridor, snoring, nor sleep apnea.     Female Patients Only:  The pt states that there is not a possibility that she is pregnant    Outpatient Prescriptions Marked as Taking for the 05/11/13 encounter (Office Visit) with Carmelina Dane, MD  Medication Sig Dispense Refill    B Complex Vitamins (B-50 COMPLEX) Tablet Take  by mouth every morning.  30 tablet      Cholecalciferol, Vitamin D3, (VITAMIN D-3) 2,000 unit Tablet Take  by mouth.        Cyanocobalamin (VITAMIN B-12) 500 mcg Lozenge Take  by mouth.        Hydrocodone-Ibuprofen (VICOPROFEN) 7.5-200 mg per tablet Take 1 tablet by mouth every 8 hours if needed for pain.  30 tablet  0    PROGESTERONE MISC          No Facility-Administered Medications for the 05/11/13 encounter (Office Visit) with Carmelina Dane, MD.       The patient has not taken blood thinning medications such as ASA, NSAIDs, Plavix, Ticlid, and Coumadin.    Allergies   Allergen Reactions    Acetaminophen Other-Reaction in Comments     No allergy; patient refuses to take.    Pcn (Penicillin) [Penicillins] Rash       Past Medical History   Diagnosis Date    Back pain        Past Surgical History   Procedure Laterality Date    Pr cesarean delivery only       C-section, low cervical x 2       Family History   Problem Relation Age of Onset    Thyroid Mother      goiter as a child    Non-contributory Father     Hypertension Paternal Grandmother     Cancer Maternal Grandmother     Heart Paternal Grandfather        History     Social History    Marital Status:  MARRIED     Spouse Name: N/A     Number of Children: 2    Years of Education: N/A     Occupational History    Health visitor      Social History Main Topics    Smoking status: Never Smoker     Smokeless tobacco: Never Used    Alcohol Use: Yes      1 glass at night    Drug Use: No    Sexually Active: Not on file     Other Topics Concern    Not on file     Social History Narrative    PREVIOUS TREATMENTS:     Excellent pain relief was achieved with none.    Moderate pain relief was achieved with traction, physical therapy.    No pain relief was achieved with traction, physical therapy, exercise.        FAMILY LIVING CIRCUMSTANCES:          The patient is currently lives with their spouse                               Rebecca Warner past medical, family, and social history were reviewed.    PHYSICAL EXAM:    Filed Vitals:    05/11/13 1000   BP: 157/87   Pulse: 95   Temp: 36.7 C (98.1 F)   TempSrc: Oral   Resp: 16   Height: 1.626 m (5\' 4" )   Weight: 60.782 kg (134 lb)   SpO2: 99%       Constitutional: alert, no distress, pleasant affect, cooperative  Skin:  Skin color, texture, turgor normal. No rashes or lesions.  Eyes:  conjunctivae and corneas clear. PERRL, EOM's intact. sclerae  normal.  Ears:  external inspection of ears show no abnormality.  Nose:  normal.  Mouth: normal.  Airway - Required for Procedures: Mallampati class 2, Oral Eval: Mouth opening normal, neck ROM  decreased extension, Thyroid-Mentum distance in fingerbreaths  3  Respiratory: clear to auscultation.  Cardiovascular:  normal rate and regular rhythm, no murmurs, clicks, or gallops.        ASA Status:  2 - Mild, controlled systemic disease and no functional limitations    Preprocedural Diagnosis  Rebecca Warner was seen today for neck pain and arm pain.    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         The patient was instructed and educated on all aspects of the plan of care.  The patient acknowledged the plan of care.  I saw and evaluated the patient with my attending physician, Dr. Rockwell Germany, DO  Fellow  Department of Anesthesiology & Pain Medicine    Note Electronically Signed By:  Geri Seminole, DO

## 2013-05-13 ENCOUNTER — Telehealth: Payer: Self-pay | Admitting: ANESTHESIOLOGISTS

## 2013-05-13 NOTE — Telephone Encounter (Signed)
Post procedure follow up call:    Procedure: Cervical epidural injection  Date: 05/11/13    1.  Have you developed any fever, N/V, or rash since the procedure? No    2.  Do you have any new symptoms? No    3.  Do you have any questions regarding the procedure you had? No    4.  Do you have any questions regarding your treatment plan? No    5.  Would you like a call back from the medical staff regarding your concerns/symptoms? Yes    Any further notes: Verified 3 ID's.    Patient stated that she had relief on the first day, but yesterday his pain more noticeable and she was inquiring about how long it takes to get the relief from the procedure?  She would also like to be referred to a spine specialist for a consult who would be able to advise her on what are the best exercises or PT based on the condition/ diagnosis.

## 2013-05-13 NOTE — Telephone Encounter (Signed)
Spoke with pt. ID x3. Explained to pt it could take up to 7 days to feel benefit of steroid. Pt states understanding. Pt denies any new pain/denies any new symptoms.  Advised pt to call us by mid next week if she still doesn't have any pain relief. Encouraged pt to use ice and to take her pain medications.  Pt states understanding and denies further questions.

## 2013-05-20 ENCOUNTER — Telehealth: Payer: Self-pay | Admitting: ANESTHESIOLOGISTS

## 2013-05-20 NOTE — Telephone Encounter (Signed)
Rebecca Warner,   It seems it would be too early to assess the result of the procedure. Can we wait 2-3 weeks and call her then? Can you route to [place] again at that time?

## 2013-05-20 NOTE — Telephone Encounter (Signed)
Dr. Charlyne Quale,     [facility] -needs to know the percentage and duration of patients pain improvement to the previous cervical epidural steroid injection given on 07.30.14 in order for me to obtain auth for a second CESI. Per Dr. Floyce Stakes notes in EMR DOS: 07.30.14 - (CES in 4-6 weeks w/ PK). Auth was requested on 08.01.14. Please Advise

## 2013-05-20 NOTE — Telephone Encounter (Signed)
Mandy RN,    Yes, I will re-route my request in 2-3 weeks.    Thank you,  Sima

## 2013-05-30 MED FILL — triamcinolone acetonide 40 mg/mL suspension for injection: INTRAMUSCULAR | Qty: 1 | Status: AC

## 2013-05-30 MED FILL — sodium chloride 0.9 % injection solution: INTRAMUSCULAR | Qty: 10 | Status: AC

## 2013-05-30 NOTE — Telephone Encounter (Signed)
Additional clinical notes have been faxed to [facility] on 08.18.14.

## 2013-05-30 NOTE — Telephone Encounter (Signed)
Per RN - Mandy's request I am re-routing my message from 08.08.14.     I'm trying to obtain auth for patient to have another CESI w/Dr. Charlyne Quale. Please see message below DOS: 08.08.14.    Thank you,  Sima

## 2013-05-30 NOTE — Telephone Encounter (Signed)
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

## 2013-06-01 NOTE — Telephone Encounter (Signed)
Auth# [id number] - CES - DOS: 08.01.14 - 11.20.14  Received Berkley Harvey from [place] @ [facility] @ [phone]    Called patient to schedule her CESI in 4-6 wks w/Dr. Charlyne Quale. Patient declined at this time to schedule and will call back 1st week of September to schedule her procedure.    Thank you,  Sima

## 2013-06-01 NOTE — Progress Notes (Signed)
Please see tele-encounter DOS: 08.08.14.

## 2013-06-03 ENCOUNTER — Encounter: Payer: Self-pay | Admitting: Family Medicine

## 2013-06-03 ENCOUNTER — Ambulatory Visit: Payer: BC Managed Care – PPO | Admitting: Family Medicine

## 2013-06-03 ENCOUNTER — Telehealth: Payer: Self-pay | Admitting: Family Medicine

## 2013-06-03 VITALS — BP 158/93 | HR 82 | Temp 98.2°F | Wt 132.5 lb

## 2013-06-03 MED ORDER — LEVOFLOXACIN 500 MG TABLET: ORAL_TABLET | ORAL | Status: DC

## 2013-06-03 NOTE — Telephone Encounter (Signed)
Nurse Triage called in and stated they need an urgent message to be sent to Dr. Zena Amos as patient is in need for seeing her per patient request for today due to breast issues and the triage nurse needs to know if we can over book her sooner than 1545.    Please assist    Thanks,  Caprice Vickii Chafe  [facility]

## 2013-06-03 NOTE — Telephone Encounter (Signed)
Message routed to wrong pod, routed to Pod B for Dr. Zena Amos to review ASAP.  Littie Deeds, MA1

## 2013-06-03 NOTE — Telephone Encounter (Signed)
Rebecca Warner is a 52yr old female    3 patient identifiers used.  Per:   patient  Reason for Call:  Breast pain, phlebitis, lymphadenopathy  Symptoms:    Pt is calling reporting fevers and malaise starting last evening with R breast moderate-severe tenderness, "a red streak radiating from the nipple to the R axilla", and swollen, tender R axillary lymph nodes starting this morning.  Pt states she has registered a fever in the 100s but has been taking ASA around the clock.  Pt is requesting an appointment.   Homecare and/or Medications given:  n/a  Advice:  Appt made today for evaluation at 1545 however I informed patient I would call clinic to see if she could be fit in sooner if needed.  For now, encouraged hydration to help rid toxins from body.   Pain: yes  Pain location and 1-10:  R breast         Disposition: Consult with MD    Dr. Zena Amos:    Can you fit this patient in sooner or wait till 1545?  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  I thought maybe with the simplicity of her exam and treatment, perhaps she could be double booked sooner?    Per:   patient verbalizes agreement to plan. Agrees to callback with any increase in symptoms/concerns or questions.     Army Fossa, RN  PCN Triage

## 2013-06-03 NOTE — Telephone Encounter (Signed)
Spoke with clinic to request earlier appointment for patient.  Rebecca Warner states she will route message to physician and MA with request and someone from office will return call to patient.    Phoned patient back and updated her on plan and that she will receive a call from office.  Until then, patient should plan on coming for 3:45pm appointment. Pt acknowledged and will await update.    Army Fossa, RN  PCN Triage

## 2013-06-03 NOTE — Nursing Note (Signed)
>>   [name], LVN     Fri Jun 03, 2013  5:32 PM  After a 30 minutes wait patient tolerated well without complication.  [name], LVN    >> [name], [place]     Fri Jun 03, 2013  5:32 PM  Rocephin 1GM given IM per order at 5:05PM, patient tolerated well immediately after the injection and will remain in the clinic per protocol. [name], LVN    >> [name], [place]     Caleen Essex Jun 03, 2013  3:54 PM  Vital signs taken, allergies verified, screened for pain.  [name], [place] I

## 2013-06-04 NOTE — Progress Notes (Signed)
ID/CC:Rebecca Warner is a 52yr old female here for right breast pain and redness    S:pt states noted yesterday onset of redness and discomfort of the right breat.  This AM was worse with some awareness of fever 101 and tenderness extending into the axilla with appearance of a tender lymph node.  This afternoon the redness spread across the breast and become firm and tender.  She is taking ASA.  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 She also avoid NSAIDs due to she has liable blood pressure but does not take anti-HTN.  Her last mammo was 2012 and she states she has been told int he past that her breast are so dense and cystic that the mammo isn't sensitive enough and should just have [place].   She had a recent epidural for cervical DJD/DDD and had an IV placed in the right cubital fossa but no complications  She is concerned about possible breast CA; no nipple discharge.  No fam hx    Allergies:   Allergies   Allergen Reactions    Acetaminophen Other-Reaction in Comments     No allergy; patient refuses to take.    Pcn (Penicillin) [Penicillins] Rash       Meds: Reviewed and updated in EPIC    Patient Active Problem List   Diagnosis    Rosacea    Low vitamin B12 level    Degenerative cervical disc    Cervical radicular pain       ROS:   All other systems negative except as noted in the HPI.    OBJECTIVE:  BP 158/93  Pulse 82  Temp(Src) 36.8 C (98.2 F) (Oral)  Wt 60.102 kg (132 lb 8 oz)  BMI 22.73 kg/m2  LMP 05/20/2013  GEN: anxious, Nondyspneic, Nonpallor, No juandice,  Chest: no retractions, clear to ascultation; non dyspneic; no fremitus  Cardiac:  RRR w/o murmurs, rubs, or gallops; normal PMI   Left Breast: no skin or nipple changes; fibrocystic like densities noted in UOQ  Right breast: erythematous pattern extending fromt he right axilla diagonally to the right medial LQ.  The entire area was approx 8-10 in x 3in circumventing the  nipple.  Ropiness, warmth and tenderness noted mostly in the right UOQ of the redness.  The region was outlined with a marker.  A tender enlarged right axillary lymph node was noted. No nipple discharge or skin changes no [place] peel skin change.         ASSESSMENT AND PLAN:  1. Right breat mastitis - unclear etio - given rocephin 1 gm IM and levaquin 500mg  sent to pharmacy; urgent referrals were doen for breast US and mammo if tolerated; also urgent breast health clinic referral was done. Patient was advised that if worsens over the weekend with increased redness, pain or fever is to go immed to nearest ED which would be SDHED to ru abscess.  I offered pain medication but patient declined      I did review patient's past medical and family/social history, no changes noted.    Barriers to Learning assessed: none. Patient verbalizes understanding of teaching and instructions.    Electronically signed by  Newell Coral. Yitzel Shasteen, D.O.  [facility]  [address]  [place], [place] [address]  [phone]

## 2013-06-06 ENCOUNTER — Ambulatory Visit (HOSPITAL_BASED_OUTPATIENT_CLINIC_OR_DEPARTMENT_OTHER)
Admission: RE | Admit: 2013-06-06 | Discharge: 2013-06-06 | Disposition: A | Discharge status: Home / Self Care | Payer: BC Managed Care – PPO | Source: Ambulatory Visit | Attending: Family Medicine | Admitting: Family Medicine

## 2013-06-06 ENCOUNTER — Ambulatory Visit
Admission: RE | Admit: 2013-06-06 | Discharge: 2013-06-06 | Disposition: A | Discharge status: Home / Self Care | Payer: BC Managed Care – PPO | Source: Ambulatory Visit | Attending: Radiologic Technologist | Admitting: Radiologic Technologist

## 2013-06-06 ENCOUNTER — Telehealth: Payer: Self-pay | Admitting: Family Medicine

## 2013-06-06 ENCOUNTER — Other Ambulatory Visit: Payer: Self-pay | Admitting: Family Medicine

## 2013-06-06 ENCOUNTER — Ambulatory Visit: Payer: BC Managed Care – PPO | Admitting: Family Medicine

## 2013-06-06 DIAGNOSIS — N61 Mastitis without abscess: Secondary | ICD-10-CM | POA: Insufficient documentation

## 2013-06-06 DIAGNOSIS — N63 Unspecified lump in unspecified breast: Secondary | ICD-10-CM

## 2013-06-06 NOTE — Telephone Encounter (Signed)
Please call patient .  Reactive lymph nodes from her infection noted only but radiologist is recommending follow up in 6 weeks.  Please advise that she still see the breast specialist and I believe that she has follow up with me this week or Dr.Clay to recheck her infection

## 2013-06-06 NOTE — Telephone Encounter (Signed)
Called and left a message to patient advising to call back regarding a message from the Doctor. Operator, if patient calls back please relay the message. Richardo Popoff, MA I

## 2013-06-06 NOTE — Telephone Encounter (Signed)
Rebecca Warner is a 52yr old female    3 patient identifiers used.    Per:   husband    Reason for Call:  Breast problem    Symptoms:    Pt was seen on Friday by Dr. Zena Amos. Pt was told she must be seen Monday after imaging was completed.     Homecare and/or Medications given:  none    Advice:  Made appt for today at 1545 with Dr. Arrie Eastern per pt request.      Pain: no    Pain location & 1-10:  n/a    Disposition: Appointment given per breast problem protocol    Per:   husband verbalizes agreement to plan. Agrees to callback with any increase in symptoms/concerns or questions.       Silvano Bilis, RN  PCN Triage  [phone]

## 2013-06-07 NOTE — Telephone Encounter (Signed)
Called and left a second message to patient advising to call back regarding a message from the Doctor. Operator, if patient calls back please relay the message. Lovette Merta, MA I

## 2013-06-08 NOTE — Telephone Encounter (Signed)
Called and left a message to patient advising to call back regarding a message from the Doctor. Operator, if patient calls back please relay the message. Chaniqua Brisby, MA I

## 2013-06-09 ENCOUNTER — Ambulatory Visit: Payer: BC Managed Care – PPO

## 2013-06-09 ENCOUNTER — Encounter: Payer: Self-pay | Admitting: Family Medicine

## 2013-06-09 ENCOUNTER — Ambulatory Visit: Payer: BC Managed Care – PPO | Admitting: Family Medicine

## 2013-06-09 ENCOUNTER — Other Ambulatory Visit: Payer: Self-pay

## 2013-06-09 VITALS — BP 158/92 | HR 84 | Temp 99.5°F | Wt 136.4 lb

## 2013-06-09 MED ORDER — LEVOFLOXACIN 500 MG TABLET: ORAL_TABLET | ORAL | Status: DC

## 2013-06-09 NOTE — Nursing Note (Signed)
>>   ROSA HEREDIA MARTINEZ, MA     Thu Jun 09, 2013  3:37 PM  Vital signs taken, allergies verified, screened for pain, smoking status reviewed, pharmacy verified.  [place], [place].

## 2013-06-09 NOTE — Progress Notes (Signed)
ID/CC:Rebecca Warner is a 52yr old female here for follow up  right breast mastitis    S:pt is doing much better on levaquin.  Had no problems with rocephin injection.  Had US done with findings most consistent with cysts. No signs of abscess.  No signs of breast pathology.  But close follow up 6 weeks after completion of antibiotics recommended.   Is still tender but less inflamed and receding of erythremia.  No further fever,chills or shakes.  She does not have an appt yet with the breast health clinic.   Allergies:   Allergies   Allergen Reactions    Acetaminophen Other-Reaction in Comments     No allergy; patient refuses to take.    Pcn (Penicillin) [Penicillins] Rash       Meds: Reviewed and updated in EPIC    Patient Active Problem List   Diagnosis    Rosacea    Low vitamin B12 level    Degenerative cervical disc    Cervical radicular pain       ROS:   All other systems negative except as noted in the HPI.    OBJECTIVE:  BP 158/92  Pulse 84  Temp(Src) 37.5 C (99.5 F) (Oral)  Wt 61.871 kg (136 lb 6.4 oz)  BMI 23.4 kg/m2  LMP 05/20/2013  GEN: NAD, Nondyspneic, Nonpallor, No juandice,  Right breast: erythematous pattern only at medial distal breast from nipple.  Still tenderness at UOQ to medial distal breast with firmness still noted in the UOQ.reactive lymphadenopathy is still present in the axilla but less tender.           ASSESSMENT AND PLAN:  1. Right breat mastitis - unclear etio - thought inflamed or reactive breast cyst.  Will continue Levaquin until next viist given some firmness and tenderness still noted as well as lymph nodes.   urgent breast health clinic referral was done last visit but patient has not been notified yet to make an appt.  Again Patient was advised that if worsens over the weekend with increased redness, pain or fever is to go immed to nearest ED which would be SDHED to ru abscess.     I did not review patient's past medical and family/social history.    Barriers to  Learning assessed: none. Patient verbalizes understanding of teaching and instructions.    Electronically signed by  Newell Coral. Calise Dunckel, D.O.  [facility]  [address]  [place], [place] [address]  [phone]

## 2013-06-09 NOTE — Patient Instructions (Signed)
Please ask Rebecca Warner about breast health referral

## 2013-06-10 NOTE — Telephone Encounter (Signed)
Patient had an appointment with Dr. Zena Amos on 06/09/2013 and her concerns were addressed. Rebecca Warner, [place]

## 2013-06-11 ENCOUNTER — Other Ambulatory Visit: Payer: Self-pay | Admitting: Family Medicine

## 2013-06-14 ENCOUNTER — Ambulatory Visit: Payer: BC Managed Care – PPO | Admitting: Family Medicine

## 2013-06-14 ENCOUNTER — Encounter: Payer: Self-pay | Admitting: Family Medicine

## 2013-06-14 VITALS — BP 139/80 | HR 71 | Temp 98.1°F | Wt 137.2 lb

## 2013-06-14 NOTE — Progress Notes (Signed)
ID/CC:Rebecca Warner is a 52yr old female here for follow up  right breast mastitis    S:pt continues to do much better.  Is still on Levaquin.  No SE or diarrhea.  She feels that her right breat is less red and painful.  The firmness is also improving.      Allergies:   Allergies   Allergen Reactions    Acetaminophen Other-Reaction in Comments     No allergy; patient refuses to take.    Pcn (Penicillin) [Penicillins] Rash       Meds: Reviewed and updated in EPIC    Patient Active Problem List   Diagnosis    Rosacea    Low vitamin B12 level    Degenerative cervical disc    Cervical radicular pain       ROS:   All other systems negative except as noted in the HPI.    OBJECTIVE:  BP 139/80  Pulse 71  Temp(Src) 36.7 C (98.1 F) (Oral)  Wt 62.234 kg (137 lb 3.2 oz)  BMI 23.54 kg/m2  LMP 06/06/2013  GEN: NAD, Nondyspneic, Nonpallor, No juandice,  Right breast: min to no erythematous pattern   Still tenderness at UOQ to medial distal breast with much less  Firmness.  The reactive lymphadenopathy is much improved and almost resolved.  Not sure if firmness in the breast is baseline cystic changes      ASSESSMENT AND PLAN:  1. Right breat mastitis - unclear etio - still suspect inflamed or reactive breast cyst.  Will complete  Levaquin x 14 days.  Will warm compress.  Has 6 week follow up US/mammo already scheduled for 07/19/13.   Urgent breast health clinic referral was done first visit.  Patient still not aware of approval of consultation.  Clarified with referrals coordinator was done    I did not review patient's past medical and family/social history.    Barriers to Learning assessed: none. Patient verbalizes understanding of teaching and instructions.    Electronically signed by  Newell Coral. Simra Fiebig, D.O.  [facility]  [address]  [place], [place] [address]  [phone]

## 2013-06-14 NOTE — Telephone Encounter (Signed)
Will forward to Dr Zena Amos who saw the patient

## 2013-06-14 NOTE — Nursing Note (Signed)
>>   [name], MA     Tue Jun 14, 2013 11:08 AM  Vital signs taken, allergies verified, screened for pain.  [name], [place]

## 2013-07-08 ENCOUNTER — Encounter: Payer: Self-pay | Admitting: Family Medicine

## 2013-07-08 ENCOUNTER — Ambulatory Visit (INDEPENDENT_AMBULATORY_CARE_PROVIDER_SITE_OTHER): Payer: BC Managed Care – PPO

## 2013-07-08 ENCOUNTER — Ambulatory Visit: Payer: BC Managed Care – PPO | Attending: Family Medicine | Admitting: Family Medicine

## 2013-07-08 VITALS — BP 151/91 | HR 90 | Temp 99.5°F | Wt 135.4 lb

## 2013-07-08 DIAGNOSIS — E538 Deficiency of other specified B group vitamins: Secondary | ICD-10-CM | POA: Insufficient documentation

## 2013-07-08 DIAGNOSIS — M5030 Other cervical disc degeneration, unspecified cervical region: Secondary | ICD-10-CM | POA: Insufficient documentation

## 2013-07-08 DIAGNOSIS — R1031 Right lower quadrant pain: Secondary | ICD-10-CM | POA: Insufficient documentation

## 2013-07-08 DIAGNOSIS — N920 Excessive and frequent menstruation with regular cycle: Secondary | ICD-10-CM | POA: Insufficient documentation

## 2013-07-08 NOTE — Progress Notes (Signed)
ID/CC:Rebecca Warner is a 52yr old female here for urgent visit regarding abd pain and bloating.    S: patient has noted abdominal bloating for several months.  Has had pain that changes from 0-6/10.  Is tender in the RLQ.  No fever,chills or shakes.  No dysuria.  No diarrhea.  No hematochezia or melena.  No colonoscopy.  No wt loss in fact is concerned about abd distention and possible ovarian CA. No vaginal bleeding.  Has had more freq menses q 18-19 days in last 3 menses.  Today is day 22.  2 menses back with heavy bleeding and clots.    Patient states had pelvic US years ago with Dr. Arcola Jansky at [facility] for menorrhagia but I was not able to find the results. To the best of patient knowledge no fibroids.     Last pap 8/11  No baseline C-scope yet      Allergies:   Allergies   Allergen Reactions    Acetaminophen Other-Reaction in Comments     No allergy; patient refuses to take.    Pcn (Penicillin) [Penicillins] Rash       Meds: Reviewed and updated in EPIC    Patient Active Problem List   Diagnosis    Rosacea    Low vitamin B12 level    Degenerative cervical disc    Cervical radicular pain       ROS:   All other systems negative except as noted in the HPI.    OBJECTIVE:  BP 151/91  Pulse 90  Temp(Src) 37.5 C (99.5 F) (Oral)  Wt 61.417 kg (135 lb 6.4 oz)  BMI 23.23 kg/m2  LMP 06/16/2013  GEN: NAD, Nondyspneic, Nonpallor, No juandice,  Abdomen:  Inspection: nondistended,   Bowel sounds: + throughout   Palpation: soft, +tenderness RLQ; no RRG; No HSM, no hernias   Masses: none; no pulsatile mass   Murphy's sign; negative      Pelvic exam:  No obvious bladder distention or tenderness   External genitalia w/o ulcers or lesions, normal in appearance.  Urethrae meatus appears normal  Vagina: no discharge, no ulcers or lesions  Cervix: no lesions, no cervical motion tenderness; pap with cotesting collected collected   Uterus: not enlarged, nontender,mobile  Adnexa: no masses or tenderness        ASSESSMENT AND  PLAN:  1. RLQ pain with bloating - unclear etio.  Given reported more freq menses and menorrhagia, urgent pelvic US ordered.  pap with cotesting pending.  Labs ordered as well.  Pending on results will either refer to Gyn or investigate GI it if neg pelvis. Patient to follow up after pelvic US. Advised if severe abd pain over the weekend and fever is to go to ED for immed evalaution    I did review patient's past medical and family/social history, no changes noted.    Barriers to Learning assessed: none. Patient verbalizes understanding of teaching and instructions.    Electronically signed by  Newell Coral. Kimberlynn Lumbra, D.O.  [facility]  [address]  [place], [place] [address]  [phone]

## 2013-07-08 NOTE — Nursing Note (Signed)
>>   [name], MA     Fri Jul 08, 2013  3:24 PM  Vital signs taken, allergies verified, screened for pain.  [name], [place]

## 2013-07-11 ENCOUNTER — Ambulatory Visit: Payer: BC Managed Care – PPO | Attending: Neurological Surgery | Admitting: Neurological Surgery

## 2013-07-11 VITALS — BP 162/95 | HR 76 | Temp 98.1°F | Resp 16 | Ht 64.0 in | Wt 136.3 lb

## 2013-07-11 DIAGNOSIS — M4712 Other spondylosis with myelopathy, cervical region: Secondary | ICD-10-CM | POA: Insufficient documentation
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
## 2013-07-11 NOTE — Nursing Note (Signed)
>>   Rebecca Warner     Mon Jul 11, 2013 10:41 AM  Identified patient using name and date of birth.  Vital signs were taken.  Screened for pain.  Allergies verified.  Pharmacy was updated.  Rebecca Karie Mainland) [place], [place]

## 2013-07-12 ENCOUNTER — Telehealth: Payer: Self-pay | Admitting: Family Medicine

## 2013-07-12 NOTE — Telephone Encounter (Signed)
She is calling for the results of lab test done at [facility] 07-08-13.   She would like for PCP to review so she can come by the office 07-13-13 between   11 am - 12 pm to pick up copies.  FYI:  She has been informed that Shaune Spittle Henchell is not in today.  Message can wait till the physician returns on 07-13-13.

## 2013-07-12 NOTE — Telephone Encounter (Signed)
Her labs are normal.  A letter was sent.  She should get onto my chart and I would be able to communicate this to her sooner.  Please print result letter for her

## 2013-07-13 NOTE — Telephone Encounter (Signed)
Called and left a message to pt advising to call back regarding a message from the Doctor. Operator, if pt calls back please relay the message and let her know she can pick up the letter with the results as she requested. Letter placed at front desk for pt. Mohamed Portlock [place], [place]

## 2013-07-13 NOTE — Telephone Encounter (Signed)
Pt came to pick up form. Declines access to mychart. Rebecca Warner [place], [place]

## 2013-07-19 ENCOUNTER — Other Ambulatory Visit: Payer: Self-pay | Admitting: Family Medicine

## 2013-07-19 ENCOUNTER — Ambulatory Visit
Admission: RE | Admit: 2013-07-19 | Discharge: 2013-07-19 | Disposition: A | Discharge status: Home / Self Care | Payer: BC Managed Care – PPO | Source: Ambulatory Visit | Attending: Family Medicine | Admitting: Family Medicine

## 2013-07-19 DIAGNOSIS — R928 Other abnormal and inconclusive findings on diagnostic imaging of breast: Secondary | ICD-10-CM

## 2013-07-19 DIAGNOSIS — N63 Unspecified lump in unspecified breast: Secondary | ICD-10-CM | POA: Insufficient documentation

## 2013-07-20 ENCOUNTER — Ambulatory Visit
Admission: RE | Admit: 2013-07-20 | Discharge: 2013-07-20 | Disposition: A | Discharge status: Home / Self Care | Payer: BC Managed Care – PPO | Source: Ambulatory Visit | Attending: Diagnostic Radiology | Admitting: Diagnostic Radiology

## 2013-07-20 ENCOUNTER — Ambulatory Visit (INDEPENDENT_AMBULATORY_CARE_PROVIDER_SITE_OTHER): Payer: BC Managed Care – PPO | Admitting: Family Medicine

## 2013-07-20 ENCOUNTER — Encounter: Payer: Self-pay | Admitting: Family Medicine

## 2013-07-20 VITALS — BP 133/87 | HR 98 | Temp 99.0°F | Ht 63.75 in | Wt 133.8 lb

## 2013-07-20 DIAGNOSIS — N920 Excessive and frequent menstruation with regular cycle: Secondary | ICD-10-CM | POA: Insufficient documentation

## 2013-07-20 DIAGNOSIS — R1031 Right lower quadrant pain: Secondary | ICD-10-CM | POA: Insufficient documentation

## 2013-07-20 MED ORDER — ATENOLOL 25 MG TABLET: 25.0000 mg | ORAL_TABLET | Freq: Every day | ORAL | Status: DC

## 2013-07-20 NOTE — Nursing Note (Signed)
>>   Rebecca [place], MA     Wed Jul 20, 2013  2:00 PM  Vital signs taken, allergies verified, screened for pain.  Rebecca Warner, [place]

## 2013-07-20 NOTE — Progress Notes (Signed)
ID/CC:Rebecca Warner is a 52yr old female here for annual    S: still hasn't had menses.  Had [place] of pelvis done an repeat breast US.  Is aware of bloating still int he abdomen.  No change in stooling.  No mucus or blood.        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    No change in vision.   No change in hearing.   No rhinorrhea.   No sore throat.    NECK:   No neck pain.  CARDIAC:   No chest pain.   No heart palpitations.  RESP:   No shortness of breath.   No coughing.  GI:   No nausea.   No vomiting.   No diarrhea.   No constipation.   + abdominal pain.  GU:   No dysuria.   No hematuria.   No change in frequency.   Psych: Mood is good  MUSCULOSKELETAL:   No arthralgias.   No myalgias.  NEURO:   No weakness.   No numbness.  SKIN:   No suspicious lesions.   No rashes          OBJECTIVE:  BP 133/87  Pulse 98  Temp(Src) 37.2 C (99 F) (Oral)  Ht 1.619 m (5' 3.75")  Wt 60.691 kg (133 lb 12.8 oz)  BMI 23.15 kg/m2  LMP 06/16/2013  GEN: NAD, Nondyspneic, Nonpallor, No juandice,  HEENT:  Head: NCAT  Eyes:PERRLA, EOMi, conj-clear; sclera - clear  Ears: normal auricle and tragus; hearing is grossly normal  Canals: without abnormalities  Tms: normal  Nose: nasal mucus normal, normal septum and turbinates  Mouth: normal appearance of the lips, no cyanosis, no ulcers or lesions.  Oral mucosa is normal. No abscesses; no obvious caries.  Tongue is normal  Throat: moist without  inflammation, hard and soft palates appear normal   Neck: supple without crepitus; symmetrical and trachea is midline   Thyroid: not enlarged, no nodules   No  Cervical or Supraclavicular LAD  No Carotid Bruits/JVD  Chest: no retractions, clear to ascultation; non dyspneic; no fremitus  Cardiac:  RRR w/o murmurs, rubs, or gallops; normal PMI   Abdomen:  Inspection: nondistended,   Bowel sounds: + throughout   Palpation: soft, +still mild tenderness RLQ and RUQ; no RRG; No HSM, no hernias   Masses: none; no pulsatile mass   Murphy's sign; negative  Breast exam - done recetly as well adn Korea mammo after mastitis  Pelvic exam:not indicated - done last visit  SKIN: no rash, ulcers,or lesion; warm and dry          ASSESSMENT AND PLAN:  1. Annual exam - pap and cotesting - negative. Next due in 2019; mammo is being monitored due to #2; routine labs normal. c-scope ordered  2. Right breast mastitis - [place] short interval looks resolved.  Has repeat mammo in 4 months to recheck architectural change in right breast  3. RLQ pain with bloating - unclear etio.  [place] has enlarged uterus with fibroids.  Labs normal.  Gyn referral done; C-scope ordered.  If continues might consider work up for GB  4. Elevated BP - home average 130s/80s with elevating into the 150-160s. Has been given HCTZ but feels cause hair loss.  Feels is drive sl by anxiety with meetings. Will try atenolol    I did review patient's past medical and family/social history, no changes noted.    Barriers to Learning assessed: none. Patient verbalizes understanding of teaching and instructions.    Electronically signed by  Newell Coral. Demere Dotzler, D.O.  [facility]  [address]  [place], [place] [address]  [phone]

## 2013-07-25 NOTE — Progress Notes (Signed)
The patient was seen, evaluated, and a care plan was developed with the Nurse Practioner. I agree with the findings and plan as outlined in the Nurse Practioner's note above.

## 2013-07-27 ENCOUNTER — Telehealth: Payer: Self-pay | Admitting: Family Medicine

## 2013-07-27 NOTE — Telephone Encounter (Signed)
Called and relayed the message to patient, patient understood and will be at the office this afternoon to pick up the letter. Seth Friedlander [place], [place] I

## 2013-07-27 NOTE — Telephone Encounter (Signed)
Please call patient   Letter of accomodation is ready  If any specific criteria are requested I will ask her to have the neurosurgeon and pain management specialist expand on any recommendations

## 2013-08-01 ENCOUNTER — Encounter: Payer: Self-pay | Admitting: Family Medicine

## 2013-09-06 ENCOUNTER — Encounter: Payer: Self-pay | Admitting: OBSTETRICS/GYN

## 2013-12-08 ENCOUNTER — Ambulatory Visit: Payer: BC Managed Care – PPO

## 2013-12-13 ENCOUNTER — Ambulatory Visit: Payer: BC Managed Care – PPO

## 2013-12-28 ENCOUNTER — Telehealth: Payer: Self-pay | Admitting: Family Medicine

## 2013-12-28 DIAGNOSIS — I10 Essential (primary) hypertension: Principal | ICD-10-CM

## 2013-12-28 MED ORDER — HYDROCHLOROTHIAZIDE 25 MG TABLET: 25.0000 mg | ORAL_TABLET | Freq: Every day | ORAL | Status: DC

## 2013-12-28 NOTE — Telephone Encounter (Signed)
Called and relayed the message to patient, patient understood. Isak Sotomayor, MA I

## 2013-12-28 NOTE — Telephone Encounter (Signed)
HCTZ sent to her pharmacy

## 2013-12-28 NOTE — Telephone Encounter (Signed)
Patient called stating that she is on a beta blocker and its causing some depression in patient and patient would like to know if the physician can put patient on hydroclorithyizide 25 mg. Patient has tried this before and would like to take this again.    Please call back please advise     Okay to leave detailed message if no answer.

## 2014-01-02 ENCOUNTER — Ambulatory Visit: Payer: Self-pay | Admitting: Otolaryngology

## 2014-01-09 ENCOUNTER — Ambulatory Visit: Payer: Self-pay | Attending: Otolaryngology | Admitting: Otolaryngology

## 2014-01-09 DIAGNOSIS — L918 Other hypertrophic disorders of the skin: Principal | ICD-10-CM

## 2014-01-09 DIAGNOSIS — R238 Other skin changes: Secondary | ICD-10-CM

## 2014-01-09 DIAGNOSIS — L908 Other atrophic disorders of skin: Principal | ICD-10-CM | POA: Insufficient documentation

## 2014-01-09 NOTE — Progress Notes (Signed)
PATIENT: Rebecca Warner LOCATION: [facility]   MR #: [id number] SEX: female  AGE: 27yr  DATE OF SERVICE: 01/09/2014 DOB: [birth year]    OTOLARYNGOLOGY/FACIAL PLASTIC SURGERY CLINIC NOTE    CHIEF COMPLAINT:  Aging face    HISTORY OF PRESENT ILLNESS:    Rebecca Warner is a 39yr year old female with concerns of aging face and interest in discussion of her options.   She is first interested in having a blepharoplasty to "relieve the weight I feel on my eyes" . She feels it makes her look tired and angry and would like to know options for improvement.   No previous surgery for the face.   She states that her mother has had two facelifts and that her mother gives her a hard time for not considering doing so eariler.     PAST MEDICAL HISTORY.    Past Medical History   Diagnosis Date    Back pain        PAST SURGICAL HISTORY    Past Surgical History   Procedure Laterality Date    Pr cesarean delivery only       C-section, low cervical x 2       ALLERGIES    Amoxicillin and Pcn (penicillin)    MEDICATIONS    Current Outpatient Prescriptions   Medication Sig    Aspirin 325 mg Tablet Tablet     Aspirin, Buffered (BUFFERIN) 325 mg Tablet Tablet Take 650 mg by mouth two times daily if needed.    B Complex Vitamins (B-50 COMPLEX) Tablet Take  by mouth every morning.    Cholecalciferol, Vitamin D3, (VITAMIN D-3) 2,000 unit Tablet Take  by mouth.    Cyanocobalamin (VITAMIN B-12) 500 mcg Lozenge Take  by mouth.    Hydrochlorothiazide (HYDRO-DIURIL) 25 mg Tablet Take 1 tablet by mouth every morning.    PROGESTERONE MISC Apply  to the affected area. Bid     No current facility-administered medications for this visit.       SOCIAL HISTORY:  Social History     Social History    Marital Status: MARRIED     Spouse Name: N/A     Number of Children: 2    Years of Education: N/A     Occupational History    Airline pilot      Social History Main Topics    Smoking status: Never Smoker     Smokeless tobacco: Never Used    Alcohol Use: Yes       Comment: 1 glass at night    Drug Use: No    Sexual Activity: Not on file     History   Substance Use Topics    Smoking status: Never Smoker     Smokeless tobacco: Never Used    Alcohol Use: Yes      Comment: 1 glass at night       I did review patients past medical and family/social history, no changes noted.     Physical examination:    The patient is a well-developed, well- nourished 53yr-old female in no acute distress.    Psychiatric:  Alert and oriented x3.  The patient has a normal affect.    Skin:  The patient shows no signs of skin irritation, rash, or erythema.    HEENT exam:  Normocephalic, atraumatic.      Eyes:moderate brow ptosis with very little upper eyelid dermatochalasis is present.   MRD and LFT are normal limits.    Pupils equally round  and reactive to light, extraocular movements are intact, conjunctiva and sclera are clear.      Nasal exam:    Septum is midline, turbinates are not hypertrophic, mucosa normal    Oral cavity/oropharynx:   no lesions or masses. Dentition good, tonsils not enlarged    Ears:   External ears norml     Neurologic exam:  Cranial nerves II-XII are grossly intact.  Facial  motion are normal.  he trapezius and sternocleidomastoid muscles are equal bilaterally and the tongue is midline.      The neck is without lymphadenopathy but shows some jowling present  .     Assessment:: Rebecca Warner is a 81yr year old female with    aging face   RECOMMENDATIONS:    Counselling and discussion with patient included   1)probable  Diagnosis,  2) probable  treatment options, and    3) recommendations for management with the following:    We reviewed the options and answered questions that she had regarding surgery and downtime as well as endoscopic browlift as an option.   We reviewed the risks benefits and alternatives includeing but not limited to bleeding, infection, need for further surgery, facial nerve paralysis  scarring, numbness, hematoma, poor cosmetic outcome as well as the alternatives and benefits, the patient expressed understanding and wishes to proceed.   We provided the fee agreement and she will consider this .     Barriers to Learning assessed: none. Patient verbalizes understanding of teaching and instructions.    Electronically signed by:    Serena Colonel. Hinda Kehr, M.D., MPH,  F.A.C.S.  Associate Professor   Facial Plastic and Reconstructive Surgery  Department of Otolaryngology-Head and Neck Surgery  PI#: [phone]  Pager: [phone]

## 2014-02-13 ENCOUNTER — Telehealth: Payer: Self-pay | Admitting: Neurological Surgery

## 2014-02-13 NOTE — Telephone Encounter (Signed)
If you reach her: Please add her to clinic schedule on Monday when Dr. Maudie Mercury returns May 10.

## 2014-02-13 NOTE — Telephone Encounter (Signed)
Attempted to return pt's call, but there was no answer, and no voice mail.  Phone continued to ring.  Kathryne Sharper, RN, MSN

## 2014-02-13 NOTE — Telephone Encounter (Signed)
Patient has appt in june but is having issues with strength and numbness , progressivley getting worse please call her at [phone] patient preferrs detailed message on answering machine .

## 2014-02-14 ENCOUNTER — Telehealth: Payer: Self-pay | Admitting: ANESTHESIOLOGISTS

## 2014-02-14 ENCOUNTER — Encounter: Payer: Self-pay | Admitting: Family Medicine

## 2014-02-14 ENCOUNTER — Ambulatory Visit: Payer: BC Managed Care – PPO | Admitting: Family Medicine

## 2014-02-14 VITALS — BP 157/92 | HR 94 | Temp 97.8°F | Wt 136.7 lb

## 2014-02-14 DIAGNOSIS — T8089XA Other complications following infusion, transfusion and therapeutic injection, initial encounter: Principal | ICD-10-CM

## 2014-02-14 DIAGNOSIS — J309 Allergic rhinitis, unspecified: Secondary | ICD-10-CM

## 2014-02-14 DIAGNOSIS — T450X5A Adverse effect of antiallergic and antiemetic drugs, initial encounter: Principal | ICD-10-CM

## 2014-02-14 MED ORDER — EPINEPHRINE 0.3 MG/0.3 ML INJECTION, AUTO-INJECTOR: 0.3000 mg | AUTO-INJECTOR | Freq: Once | INTRAMUSCULAR | Status: AC | PRN

## 2014-02-14 MED ORDER — ALBUTEROL SULFATE HFA 90 MCG/ACTUATION AEROSOL INHALER
1.0000 | INHALATION_SPRAY | RESPIRATORY_TRACT | Status: DC | PRN
Start: 2014-02-14 — End: 2015-02-09

## 2014-02-14 NOTE — Nursing Note (Signed)
>>   [name], MA     Tue Feb 14, 2014  2:57 PM  Vital signs taken, allergies verified, screened for pain.  [name], [place] I

## 2014-02-14 NOTE — Telephone Encounter (Addendum)
Last cervical epidural steroid injection was 05/11/13, 40mg  triamcinolone (Kenalog) injected.

## 2014-02-14 NOTE — Telephone Encounter (Signed)
LMTCB

## 2014-02-14 NOTE — Telephone Encounter (Signed)
Identified x 3    Patient states I am experiencing a lot of pain on my (Left) side shoulder and having numbness with pins and needle feeling in my hands and fingers. I am seeing Dr. Ward Chatters on 05.06.15 and Dr. Maudie Mercury on 06.22.15. I would like to schedule another cervical injection with Dr. Luis Abed. But before I schedule is it possible for Dr. Luis Abed to use half the dosage on me instead of full. Patient would like to discuss the steroid dosage before she schedules. Patient was last seen on 07.30.14. Per Dr. Modena Slater note in [facility]: 07.30.14 (CES in 4-6 wks w/PK). Please see telencounter in emr dos: 08.08.2014. Patient can be reached at [phone]. Please Advise.

## 2014-02-14 NOTE — Telephone Encounter (Signed)
Verified patient information. Patient returning call from nurse. Patient can be reached at [phone]. Please advise. Thank you.

## 2014-02-14 NOTE — Progress Notes (Signed)
ID/CC:Rebecca Warner is a 53yr old female here for SOB    S:pt states 4 nights ago had crab sushi.  Was having some SHORTNESS OF BREATH and coughing.  Took Benadryl and resolved.  Never went to ED but felt like she might have too.  Had wheezing and cough.  2 hrs later started to improved.  Never any shell fish reaction in the past.    Has also had worse allergy symptoms.        Allergies:   Allergies   Allergen Reactions    Amoxicillin Hives    Pcn (Penicillin) [Penicillins] Rash       Meds: Reviewed and updated in EPIC    Patient Active Problem List   Diagnosis    Rosacea    Low vitamin B12 level    Degenerative cervical disc    Cervical radicular pain       ROS:   All other systems negative except as noted in the HPI.    OBJECTIVE:  BP 157/92   Pulse 94   Temp(Src) 36.6 C (97.8 F) (Tympanic)   Wt 62.007 kg (136 lb 11.2 oz)   SpO2 100%    GEN: NAD, Nondyspneic, Nonpallor, No juandice,  GEN: NAD, non dyspneic, sounds congested  EENT:  Eyes: PERRLA, EOMi, conj injected, sclera clear  Canals: without abnormalities  Tms: normal  Nose: nasal mucus erythematous, edematous  Throat: moist cobblestoning w/ PND - thick and clear  Mouth: lips normal- no lesions  Neck: supple, no  cervical or supraclavicular adenopathy   Thyroid: not enlarged, no nodules  Chest: non dyspneic, no retractions, clear to ascultation   Cardiac:  RRR w/o murmurs, rubs, or gallops     ASSESSMENT AND PLAN:  1.  Questionable Allergic reaction to shellfish - advised to avoid.  Epi pen sent and albuterol HFA.  Advised if uses epi pen must go to ED.  Allergy referral done  2.  Allergic rhinitis: advised on medications, see orders    I did not review patients past medical and family/social history.    Barriers to Learning assessed: none. Patient verbalizes understanding of teaching and instructions.    Electronically signed by  Grace Isaac. Ellayna Hilligoss, [facility]  [address], [place] [address]  [phone]

## 2014-02-15 ENCOUNTER — Encounter: Payer: Self-pay | Admitting: Physical Medicine & Rehabilitation

## 2014-02-15 NOTE — Telephone Encounter (Signed)
Verified Patient information. Patient returning call of nurse from yesterday. Patient available for call back at [phone]. Patient will be away from her phone from 3 - 4 pm but will be available today otherwise. Please advise. Thank you.

## 2014-02-15 NOTE — Telephone Encounter (Signed)
Patient called back. She reports an episode of mastitis after the last CES 05-11-13, which she attributed to the steroid.Patient does have a history of fibrocystic breast disease but had no history of mastitis. It resolved on it's own without incident. 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 She would like to know how this will alter the efficacy. She states she had excellent sustained relief from the last epidural until this month. Advised her that we do adjust steroid dosing based on clinical assessment and she can discuss the efficacy of different dosing with the MD, at the time of her appointment. She is also seeing Dr. Maudie Mercury tomorrow, so she will get his input before proceeding.

## 2014-02-20 ENCOUNTER — Ambulatory Visit: Payer: BC Managed Care – PPO | Attending: Neurological Surgery | Admitting: Neurological Surgery

## 2014-02-20 VITALS — BP 167/97 | HR 77 | Temp 98.1°F | Resp 16 | Ht 64.0 in | Wt 136.6 lb

## 2014-02-20 DIAGNOSIS — M4712 Other spondylosis with myelopathy, cervical region: Principal | ICD-10-CM | POA: Insufficient documentation

## 2014-02-20 DIAGNOSIS — M5020 Other cervical disc displacement, unspecified cervical region: Secondary | ICD-10-CM | POA: Insufficient documentation

## 2014-02-20 NOTE — Nursing Note (Signed)
>>   Rebecca Warner     Mon Feb 20, 2014 10:13 AM  Identified patient using name and date of birth.  Vital signs were taken.  Screened for pain.  Allergies verified.  Pharmacy was updated.  Rebecca Deatra Canter) [place], [place] II

## 2014-02-20 NOTE — Progress Notes (Signed)
Neurosurgery Spine Clinic       HISTORY OF PRESENT ILLNESS:   Rebecca Warner is a 52yr old female with complaint of neck pain and left arm pain in a C7 dermatome. Patient reports that over the past 6 weeks she has started to have right arm pain in a C7 dermatome. She does note some changes in her balance and tripping occasionally with her right foot. She denies dropping objects. Her neck and left arm pain are exacerbated by motion.    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        REVIEW OF SYSTEMS:  Positive for the aforementioned in the history of present illness. All others are negative.       PAST MEDICAL/SURGICAL HISTORY:  Past Medical History   Diagnosis Date    Back pain      Past Surgical History   Procedure Laterality Date    Pr cesarean delivery only       C-section, low cervical x 2         CURRENT MEDICATIONS:   No outpatient prescriptions have been marked as taking for the 02/20/14 encounter (Office Visit) with Arva Chafe, MD.       ALLERGIES:    Allergies   Allergen Reactions    Amoxicillin Hives    Pcn (Penicillin) [Penicillins] Rash        Vital Signs: BP 167/97   Pulse 77   Temp(Src) 36.7 C (98.1 F) (Oral)   Resp 16   Ht 1.626 m (5\' 4" )   Wt 61.961 kg (136 lb 9.6 oz)   BMI 23.44 kg/m2     PHYSICAL EXAMINATION:  General :no distress  HEENT: Normocephalic, atrauamtic. Mucous membranes moist. Trachea midline.    Cardiovascular: Regular rate and rhythm.   Respiratory: Clear to auscultation  Abdomen: Non tender   Neurological:   Awake, alert, and oriented to person, place, time, and situation.   Pupils are equal round reactive to light and accomodation at 71mm.   Cranial nerves II-XII are intact,   Extraocular muscles intact.  Sensation intact to light touch and pinprick.     Motor:    Deltoid  Biceps  Triceps  Wrist Flexion  Wrist Extension  Hand Intrinsics    Right  5 5 5 5 5 5    Left  5 5 4+ 5 5 5       Hip Flexion  Knee Extension  Knee Flexion  Dorsiflex  Great Toe Extension  Plantar  Flexion    Right  5 5 5 5 5 5    Left  5 5 5 5 5 5      DTR: 3+/4 in BLE and BLE     No Babinski  Mild Hoffmans on the left.    Radiographic Imaging: No new imaging. Previous imaging reviewed.     IMPRESSION:     Rebecca Warner is a 53yr old female with complaint of chronic neck pain, bilateral arm pain left worse than right due to C6/7 herniated disc and cervical spondylotic myeloradiculopathy.     Plan:  1. Recommend C6/7 disc arthroplasty vs ACDF.   2. Obtain MRI c-spine  3. Follow up in clinic after MRI.     The patient was seen by Dr. Maudie Mercury, who agrees with the above impression and plan.    Leanna Battles, DO  Neurological Surgery Spine Fellow

## 2014-02-27 ENCOUNTER — Ambulatory Visit
Admission: RE | Admit: 2014-02-27 | Discharge: 2014-02-27 | Disposition: A | Discharge status: Home / Self Care | Payer: BC Managed Care – PPO | Source: Ambulatory Visit | Attending: Diagnostic Radiology | Admitting: Diagnostic Radiology

## 2014-02-27 DIAGNOSIS — M4712 Other spondylosis with myelopathy, cervical region: Secondary | ICD-10-CM

## 2014-02-27 DIAGNOSIS — M5020 Other cervical disc displacement, unspecified cervical region: Principal | ICD-10-CM | POA: Insufficient documentation

## 2014-03-01 ENCOUNTER — Encounter: Payer: Self-pay | Admitting: Rheumatology

## 2014-03-01 ENCOUNTER — Ambulatory Visit (INDEPENDENT_AMBULATORY_CARE_PROVIDER_SITE_OTHER): Payer: BC Managed Care – PPO

## 2014-03-01 ENCOUNTER — Ambulatory Visit: Payer: BC Managed Care – PPO | Attending: Rheumatology | Admitting: Rheumatology

## 2014-03-01 VITALS — BP 118/78 | HR 75 | Temp 95.9°F | Ht 63.75 in | Wt 137.5 lb

## 2014-03-01 DIAGNOSIS — H1010 Acute atopic conjunctivitis, unspecified eye: Secondary | ICD-10-CM | POA: Insufficient documentation

## 2014-03-01 DIAGNOSIS — J45909 Unspecified asthma, uncomplicated: Secondary | ICD-10-CM | POA: Insufficient documentation

## 2014-03-01 DIAGNOSIS — J309 Allergic rhinitis, unspecified: Secondary | ICD-10-CM | POA: Insufficient documentation

## 2014-03-01 NOTE — Nursing Note (Signed)
>>   Audelia Acton, MA     Wed Mar 01, 2014 10:37 AM  Vital signs taken, allergies verified, and screened for pain.    [place], [place]

## 2014-03-01 NOTE — Progress Notes (Signed)
Dear Dr. Audree Camel:    Thank you very much for your request of an allergy consultation on 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  The patient states that while living in [place] at age 38, she began to have nasal and eye symptoms when exposed to juniper pollen for which chlorpheniramine taken was of benefit.  She moved to [place] 7 years ago and initially did very well, however, approximately 2 years ago, she began to have problems with nasal congestion, runny nose, sneezing, and itchy nose and eye,s with an episode of giant hives in the spring and fall.  If she traveled to [place] [place], she was better She took loratadine, Zaditor and Benadryl with improvement but not full control of her symptoms.  2 months ago, she had an episode of cough and wheeze following consumption of crab sushi and cashews.  She took Benadryl with prompt resolution of her symptoms.  The patient has also experienced redness and itchiness of her face the day after consumption of crab and shrimp.     Past medical history and review of systems questionnaire is notable for HNP for which the patient is on aspirin.  Family history is positive for father with hay fever.  Social history: The patient lives in a house in [place] with central air conditioning and cats.  She is a never smoker.    Objective data: BP 118/78.  Pulse 75.  Afebrile.  Weight 137.  HEENT: Conjunctivae, tympanic membranes and pharynx  were clear.  Nasal mucosa showed mild swelling without ulceration.  Neck was supple without masses.  Chest was clear to auscultation.  C.-V: No murmurs or rubs.  Skin: No acute rashes.    Assessment/plan: Allergic rhinoconjunctivitis, asthma.  The patient does not feel she can stop her H1 blocker and RAST testing will be undertaken for pollens and foods.  She will continue medications as listed above and keep on hand the previously prescribed albuterol MDI and EpiPen.    She will return in 3 weeks for  followup.  I appreciate being able to assist in her care.

## 2014-03-02 NOTE — Progress Notes (Signed)
This patient was seen, evaluated, and care plan was developed with the Spine Fellow.  I agree with the assessment and plan as outlined in the fellow's note.  Report electronically signed by Jayley Hustead D Madline Oesterling, MD. Attending

## 2014-03-03 ENCOUNTER — Telehealth: Payer: Self-pay | Admitting: Neurological Surgery

## 2014-03-03 NOTE — Telephone Encounter (Signed)
Spoke to patient and explained results. Please add her to clinic schedule for June 1.

## 2014-03-03 NOTE — Telephone Encounter (Signed)
Patient would like results of MRI.  Patient will make an appointment for Follow up with DR. Maudie Mercury.  Transferred her to scheduling line.    Please call patient with results.

## 2014-03-07 NOTE — Telephone Encounter (Signed)
Pateint was added to Schedule June 1.  Called patient to confirm. Patient states understanding.

## 2014-03-12 LAB — RAST HALLETT PANEL
Alternaria alternata: <0.35 kU/L
Aspergillus fumigatus: <0.35 kU/L
Bermuda grass: 3.56 kU/L — ABNORMAL HIGH
Box-elder,maple: <0.35 kU/L
Cat dander: <0.35 kU/L
Cladosporium herbarum: <0.35 kU/L
Common pigweed: 3.91 kU/L — ABNORMAL HIGH
Common silver birch: <0.35 kU/L
D. pteronyssinus: 0.74 kU/L — ABNORMAL HIGH
D.farinae: 0.59 kU/L — ABNORMAL HIGH
Dog dander: <0.35 kU/L
Elm: 0.39 kU/L — ABNORMAL HIGH
Goose Feathers: <0.35 kU/L
Goosefoot,Lamb's quarters: 0.35 kU/L — ABNORMAL HIGH
Mountain juniper/cedar: 23.1 kU/L — ABNORMAL HIGH
Mugwort: 0.67 kU/L — ABNORMAL HIGH
Mulberry: <0.35 kU/L
Oak: <0.35 kU/L
Olive: 1.6 kU/L — ABNORMAL HIGH
Penicillium chrysogenum: <0.35 kU/L
Plantain (English), Ribwort: <0.35 kU/L
Sycamore: <0.35 kU/L
Timothy grass: 30.3 kU/L — ABNORMAL HIGH
Walnut Tree: 2.81 kU/L — ABNORMAL HIGH
Western ragweed: <0.35 kU/L

## 2014-03-12 LAB — ALLERGENS, COMMON FOOD, ADULT
Clam: <0.35 kU/L
Egg white: <0.35 kU/L
Fish, cod: <0.35 kU/L
Maize,corn: <0.35 kU/L
Milk: <0.35 kU/L
Peanut: 1.98 kU/L — ABNORMAL HIGH
Scallop: <0.35 kU/L
Sesame seed: <0.35 kU/L
Shrimp: <0.35 kU/L
Soybean: <0.35 kU/L
Walnut: <0.35 kU/L
Wheat: 0.44 kU/L — ABNORMAL HIGH

## 2014-03-12 LAB — CASHEW NUT: Cashew nut: <0.35 kU/L

## 2014-03-12 LAB — CRAB (RAST ALLERGY TEST): Crab: 0.42 kU/L — ABNORMAL HIGH

## 2014-03-13 ENCOUNTER — Ambulatory Visit
Admission: RE | Admit: 2014-03-13 | Discharge: 2014-03-13 | Disposition: A | Discharge status: Home / Self Care | Payer: BC Managed Care – PPO | Source: Ambulatory Visit | Attending: NURSE PRACTITIONER | Admitting: NURSE PRACTITIONER

## 2014-03-13 ENCOUNTER — Ambulatory Visit (HOSPITAL_BASED_OUTPATIENT_CLINIC_OR_DEPARTMENT_OTHER): Payer: BC Managed Care – PPO | Admitting: Neurological Surgery

## 2014-03-13 VITALS — BP 154/84 | HR 73 | Temp 98.1°F | Resp 16 | Ht 64.0 in | Wt 137.4 lb

## 2014-03-13 DIAGNOSIS — M5020 Other cervical disc displacement, unspecified cervical region: Secondary | ICD-10-CM

## 2014-03-13 DIAGNOSIS — M5412 Radiculopathy, cervical region: Principal | ICD-10-CM

## 2014-03-13 DIAGNOSIS — M5030 Other cervical disc degeneration, unspecified cervical region: Secondary | ICD-10-CM

## 2014-03-13 NOTE — Nursing Note (Signed)
>>   Gilles Chiquito, MA     Mon Mar 13, 2014  3:00 PM  Vital signs taken,Allergies verified,Pt screened for pain. Med Refills Needed: no  Gilles Chiquito [place] II.

## 2014-03-15 NOTE — Progress Notes (Signed)
PATIENTJOLIET, Warner  MR #:  [id number]  DOB:  04/24/1961  SEX:  F  AGE:  53  SERVICE DATE:  03/13/2014  [facility] NOTE    SPINE CARE PROGRAM CLINIC NOTE      We saw Rebecca Warner for followup in our Neurosurgery Clinic on  03/13/2014.    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  She reports radiation down both arms.  She states that the right arm  symptoms are more recent. Arm pain is worse than the neck pain, and  left arm is worse than the right.  Pain today is 7/10 and varies as  high 8 to 9/10.  She  reports occasional numbness and tingling of the  thumb, index and fifth digit, bilaterally.  This is intermittent.  She  denies any bladder or bowel dysfunction.    On the last visit on 02/20/2014 we requested a followup MRI of  cervical spine for re-evaluation.  We recommended C6-7 anterior  cervical discectomy and fusion versus arthroplasty.  Her insurance  denied arthroplasty.    OBJECTIVE:    Vitals:  Blood pressure 154/84, temperature 98.1, pulse 73,  respirations 16, weight 137.4 pounds, height 5 feet 4 inches.  Alert,  oriented, in no acute distress.  Muscle strength is 5/5 except left  triceps is 4- to 5.  Deep tendon reflexes, upper and lower extremities  2 all around.  Gait is steady.  Hoffman's is negative.    IMAGING STUDIES:  MRI of the cervical spine without contrast, dated 02/27/2014 compared  with previous MRI dated 04/11/2013 and 03/12/2013, reviewed by Dr. Maudie Mercury  today, reveals unchanged left paramedian disc protrusion at C5-6 abuts  the lateral spinal cord ANC contributes to left-sided neural foraminal  narrowing.  At C6-7, there is disc osteophyte complex with associated  spinal canal stenosis and flattening of the ventral cord and left-  sided neural foraminal narrowing.    ASSESSMENT AND PLAN:    As we discussed with the patient, her radiological findings are  stable.   The patient would like to wait for surgery and would rather  proceed with arthroplasty rather than fusion.  We will see her on a  p.r.n. basis.  The patient agrees with the plan.    Dr. Maudie Mercury reviewed critical parts of history and physical exam,  radiological studies, and directed the care of the patient.      Report Electronically Signed - 03/18/2014 18:52:14 by  Maynard David Gemma Payor, PA-C  Physician Assistant  Neurosurgery    Report Electronically Signed - 03/22/2014 11:05:23 by  Arva Chafe, MD  Professor and Chief Of Spinal Neurosurgery        STP/lb  D:  03/14/2014 11:48:28 PDT/PST  T:  03/15/2014 15:57:56 PDT/PST  Job #:  [id number] / [id number]

## 2014-03-23 ENCOUNTER — Ambulatory Visit: Payer: BC Managed Care – PPO | Admitting: Rheumatology

## 2014-03-31 ENCOUNTER — Telehealth: Payer: Self-pay | Admitting: Rheumatology

## 2014-03-31 NOTE — Telephone Encounter (Signed)
Patient calling because she had lab work ordered by Dr. Karis Juba done on 03/01/14.  She said she is going to a big party this weekend and will be having shrimp and she was told by Dr. Karis Juba that that might be one of her issues.    She is wanting to know if there is any chance that someone could call her back and just let her know if she can eat shrimp this weekend.    She can be reached at [phone] and asked that we please leave a message with the information because she will be out and does not have a cell phone.    Advised patient that Dr. Karis Juba is not here today but she is wanting to know if someone else can let her know.    Please assist.    Thank you,     Cindy Hazy

## 2014-03-31 NOTE — Telephone Encounter (Signed)
Spoke with patient.  Informed her that crab came back positive, but shrimp did not.  Also informed her that she may still be allergic to Shrimp, so avoid it if she has had symptoms eating it in the past.    She will schedule her follow up  Appointment with Dr. Karis Juba.  Suzan Slick, RN

## 2014-04-03 ENCOUNTER — Ambulatory Visit: Payer: BC Managed Care – PPO | Admitting: Neurological Surgery

## 2014-04-05 ENCOUNTER — Ambulatory Visit: Payer: BC Managed Care – PPO | Admitting: Rheumatology

## 2014-04-05 ENCOUNTER — Encounter: Payer: Self-pay | Admitting: Rheumatology

## 2014-04-05 VITALS — BP 152/90 | HR 80 | Temp 98.2°F | Wt 135.3 lb

## 2014-04-05 DIAGNOSIS — J45909 Unspecified asthma, uncomplicated: Secondary | ICD-10-CM

## 2014-04-05 DIAGNOSIS — J45991 Cough variant asthma: Secondary | ICD-10-CM | POA: Insufficient documentation

## 2014-04-05 DIAGNOSIS — H1010 Acute atopic conjunctivitis, unspecified eye: Secondary | ICD-10-CM | POA: Insufficient documentation

## 2014-04-05 DIAGNOSIS — J309 Allergic rhinitis, unspecified: Secondary | ICD-10-CM

## 2014-04-05 NOTE — Nursing Note (Signed)
>>   Suzan Slick, RN     Wed Apr 05, 2014  4:35 PM  Patient has signed Immunotherapy Consent.  All questions were answered.  Patient will check with her insurance about any deductable or co-pay, and call back to confirm she would like [place] to mix the antigen.  Suzan Slick, RN    >> Audelia Acton, MA     Wed Apr 05, 2014  3:27 PM  Vital signs taken, allergies verified, and screened for pain.    [place], [place]

## 2014-04-05 NOTE — Progress Notes (Signed)
The patient is seen in followup for her allergic rhinoconjunctivitis and asthma.  She has had minimal symptoms recently on loratadine plus Zaditor.    Laboratories: 5/15, [facility] RAST panel testing was notable for class IV Timothy; class III [place], Mountain juniper, pigweed; class II mite, olive, walnut; class I Elm, lambs quarter, mugwort.  Food RAST testing was class II peanut; class 1 crab, wheat.  Cashew was negative.    Assessment/plan: Allergic rhinoconjunctivitis, asthma.  The options of management were discussed and the patient elects immunotherapy.  She will use loratadine, Zaditor and albuterol for breakthrough symptoms.     Problem 2: Food allergy.  The patient will keep her EpiPen on hand and stricty avoid peanuts and crab.  The patient does not likely have a significant wheat allergy as she has consumed these products without distinct reactions.

## 2014-04-06 ENCOUNTER — Other Ambulatory Visit: Payer: Self-pay | Admitting: Rheumatology

## 2014-04-06 DIAGNOSIS — Z9109 Other allergy status, other than to drugs and biological substances: Secondary | ICD-10-CM | POA: Insufficient documentation

## 2014-04-06 DIAGNOSIS — J309 Allergic rhinitis, unspecified: Principal | ICD-10-CM

## 2014-04-06 NOTE — Progress Notes (Signed)
Consent has been signed.  Hula Tasso C Dayvian Blixt, RN

## 2014-05-16 ENCOUNTER — Ambulatory Visit: Payer: BC Managed Care – PPO

## 2014-05-16 DIAGNOSIS — J3089 Other allergic rhinitis: Secondary | ICD-10-CM

## 2014-05-16 NOTE — Nursing Note (Signed)
>>   Suzan Slick, RN     Tue May 16, 2014  5:04 PM  35 doses Aq Tree/Grass/Weed/Mites mixed.  Suzan Slick, RN

## 2014-07-25 ENCOUNTER — Telehealth: Payer: Self-pay | Admitting: Rheumatology

## 2014-07-25 NOTE — Telephone Encounter (Addendum)
Patient called stating that she was told by Almyra Free that once patients Serum came in, which should have been late August early September, patient states that she still has not gotten a call regarding her Serum and patient would like a call back as soon as possible.    Please call back please     Okay to leave detailed message if no answer.

## 2014-07-27 ENCOUNTER — Ambulatory Visit: Payer: BC Managed Care – PPO

## 2014-07-27 DIAGNOSIS — J301 Allergic rhinitis due to pollen: Secondary | ICD-10-CM

## 2014-07-27 NOTE — Nursing Note (Signed)
Patient here for Immunotherapy.  Patient and antigen were identified with 2 identifiers, including patient's confirmation of correct antigen bottle(s).  Patient's condition was then assessed, which included any immediate or delayed reaction to previous dose, current health status, Peak flow reading if indicated, use of any new meds such as antibiotic or Beta Blocker, if antihistamine had been taken within the previous 24 hours, and length of time since last allergy injection(s).    The allergy injection(s) were then administered per the build up schedule.  Patients dose was first oin.(reduced, repeated, advanced).   Patient received 0.05 of 1:1000 Aq Tree/Grass/Weed/Mites .( dose, concentration, name of antigen).  Suzan Slick, RN

## 2014-07-27 NOTE — Nursing Note (Signed)
After 30 minutes observation, Patient's injection sites were checked for any local reaction. Patient then left the clinic feeling fine.  Local reaction was 2/40 (wheal/erythema).  Monea Pesantez C Braylynn Lewing, RN

## 2014-07-27 NOTE — Telephone Encounter (Signed)
Spoke with patient.  Antigen is ready.  She will come for her first injection this afternoon at 3 PM.  Suzan Slick, RN

## 2014-08-01 ENCOUNTER — Ambulatory Visit: Payer: BC Managed Care – PPO

## 2014-08-01 DIAGNOSIS — J45909 Unspecified asthma, uncomplicated: Secondary | ICD-10-CM

## 2014-08-01 NOTE — Nursing Note (Signed)
Patient here for Immunotherapy.  Patient and antigen were identified with 2 identifiers, including patient's confirmation of correct antigen bottle(s).  Patient's condition was then assessed, which included any immediate or delayed reaction to previous dose, current health status, Peak flow reading if indicated, use of any new meds such as antibiotic or Beta Blocker, if antihistamine had been taken within the previous 24 hours, and length of time since last allergy injection(s).    The allergy injection(s) were then administered per the build up schedule.  Patients dose was advanced.(reduced, repeated, advanced).   Patient received 0.15 of 1:1000 Aq Tree/Grass/Weed/Mites .( dose, concentration, name of antigen).  Redell Bhandari C Amori Colomb, RN

## 2014-08-01 NOTE — Nursing Note (Signed)
After 30 minutes observation, Patient's injection sites were checked for any local reaction. Patient then left the clinic feeling fine.  Local reaction was 3/35 (wheal/erythema).  Loistine Eberlin C Shaheim Mahar, RN

## 2014-08-03 ENCOUNTER — Ambulatory Visit: Payer: BC Managed Care – PPO

## 2014-08-03 DIAGNOSIS — J3089 Other allergic rhinitis: Secondary | ICD-10-CM

## 2014-08-03 NOTE — Nursing Note (Signed)
Patient here for Immunotherapy.  Patient and antigen were identified with 2 identifiers, including patient's confirmation of correct antigen bottle(s).  Patient's condition was then assessed, which included any immediate or delayed reaction to previous dose, current health status, Peak flow reading if indicated, use of any new meds such as antibiotic or Beta Blocker, if antihistamine had been taken within the previous 24 hours, and length of time since last allergy injection(s).    The allergy injection(s) were then administered per the build up schedule.  Patients dose was advanced.(reduced, repeated, advanced).   Patient received 0.25 of 1:1000 Aq Tree/Grass/Weed/Mites .( dose, concentration, name of antigen).  Suzan Slick, RN      After 30 minutes observation, Patient's injection sites were checked for any local reaction. Patient then left the clinic feeling fine.  Local reaction was 1/35 (wheal/erythema).  Suzan Slick, RN

## 2014-08-08 ENCOUNTER — Ambulatory Visit: Payer: BC Managed Care – PPO

## 2014-08-08 DIAGNOSIS — J45909 Unspecified asthma, uncomplicated: Secondary | ICD-10-CM

## 2014-08-08 NOTE — Nursing Note (Signed)
Patient here for Immunotherapy.  Patient and antigen were identified with 2 identifiers, including patient's confirmation of correct antigen bottle(s).  Patient's condition was then assessed, which included any immediate or delayed reaction to previous dose, current health status, Peak flow reading if indicated, use of any new meds such as antibiotic or Beta Blocker, if antihistamine had been taken within the previous 24 hours, and length of time since last allergy injection(s).    The allergy injection(s) were then administered per the build up schedule.  Patients dose was advanced.(reduced, repeated, advanced).   Patient received 0.50 of 1:1000 Aq Tree/Grass/Weed/Mites .( dose, concentration, name of antigen).  Suzan Slick, RN      After 30 minutes observation, Patient's injection sites were checked for any local reaction. Patient then left the clinic feeling fine.  Local reaction was 0/50 (wheal/erythema).  Suzan Slick, RN    Patient had several small red circular "dots" on several areas... Only about 6 total.  1 on thigh, 2 on foot, 1 on stomach.   Shown to Dr. Karis Juba who did not have an opinion on what they were, but they were no contraindication to receiving her allergy injection today. These areas were very small... About 2 mm in diameter.  Suzan Slick, RN

## 2014-08-10 ENCOUNTER — Ambulatory Visit: Payer: BC Managed Care – PPO

## 2014-08-15 ENCOUNTER — Ambulatory Visit: Payer: BC Managed Care – PPO

## 2014-08-15 DIAGNOSIS — J45909 Unspecified asthma, uncomplicated: Secondary | ICD-10-CM

## 2014-08-15 NOTE — Nursing Note (Signed)
After 30 minutes observation, Patient's injection sites were checked for any local reaction. Patient then left the clinic feeling fine.  Local reaction was 1/43 (wheal/erythema).  Shlonda Dolloff C Tenelle Andreason, RN

## 2014-08-15 NOTE — Nursing Note (Signed)
Patient here for Immunotherapy.  Patient and antigen were identified with 2 identifiers, including patient's confirmation of correct antigen bottle(s).  Patient's condition was then assessed, which included any immediate or delayed reaction to previous dose, current health status, Peak flow reading if indicated, use of any new meds such as antibiotic or Beta Blocker, if antihistamine had been taken within the previous 24 hours, and length of time since last allergy injection(s).    The allergy injection(s) were then administered per the build up schedule.  Patients dose was advanced.(reduced, repeated, advanced).   Patient received 0.05 of 1:100 Aq Tree/Grass/Weed/Mites .( dose, concentration, name of antigen).  Suzan Slick, RN

## 2014-08-17 ENCOUNTER — Ambulatory Visit: Payer: BC Managed Care – PPO

## 2014-08-17 DIAGNOSIS — J45909 Unspecified asthma, uncomplicated: Secondary | ICD-10-CM

## 2014-08-17 NOTE — Nursing Note (Signed)
Patient here for Immunotherapy.  Patient and antigen were identified with 2 identifiers, including patient's confirmation of correct antigen bottle(s).  Patient's condition was then assessed, which included any immediate or delayed reaction to previous dose, current health status, Peak flow reading if indicated, use of any new meds such as antibiotic or Beta Blocker, if antihistamine had been taken within the previous 24 hours, and length of time since last allergy injection(s).    The allergy injection(s) were then administered per the build up schedule.  Patients dose was advanced.(reduced, repeated, advanced).   Patient received 0.10 of 1:100 Aq Tree/Grass/Weed/Mites .( dose, concentration, name of antigen).  Suzan Slick, RN

## 2014-08-17 NOTE — Nursing Note (Signed)
After 30 minutes observation, Patient's injection sites were checked for any local reaction. Patient then left the clinic feeling fine.  Local reaction was 3/30 (wheal/erythema).  Suzan Slick, RN

## 2014-08-22 ENCOUNTER — Ambulatory Visit: Payer: BC Managed Care – PPO

## 2014-08-22 DIAGNOSIS — J45909 Unspecified asthma, uncomplicated: Secondary | ICD-10-CM

## 2014-08-22 NOTE — Nursing Note (Signed)
After 30 minutes observation, Patient's injection sites were checked for any local reaction. Patient then left the clinic feeling fine.  Local reaction was 3/43 (wheal/erythema).  Jerek Meulemans C Lazette Estala, RN

## 2014-08-22 NOTE — Nursing Note (Signed)
Patient here for Immunotherapy.  Patient and antigen were identified with 2 identifiers, including patient's confirmation of correct antigen bottle(s).  Patient's condition was then assessed, which included any immediate or delayed reaction to previous dose, current health status, Peak flow reading if indicated, use of any new meds such as antibiotic or Beta Blocker, if antihistamine had been taken within the previous 24 hours, and length of time since last allergy injection(s).    The allergy injection(s) were then administered per the build up schedule.  Patients dose was advanced.(reduced, repeated, advanced).   Patient received 0.20 of 1:100 Aq Tree/Grass/Weed/Mites .( dose, concentration, name of antigen).  Suzan Slick, RN

## 2014-08-24 ENCOUNTER — Ambulatory Visit: Payer: BC Managed Care – PPO

## 2014-08-24 DIAGNOSIS — J45909 Unspecified asthma, uncomplicated: Secondary | ICD-10-CM

## 2014-08-24 NOTE — Nursing Note (Signed)
After 30 minutes observation, Patient's injection sites were checked for any local reaction. Patient then left the clinic feeling fine.  Local reaction was 3/43 (wheal/erythema).  Varie Machamer C Grainger Mccarley, RN

## 2014-08-24 NOTE — Nursing Note (Signed)
Patient here for Immunotherapy.  Patient and antigen were identified with 2 identifiers, including patient's confirmation of correct antigen bottle(s).  Patient's condition was then assessed, which included any immediate or delayed reaction to previous dose, current health status, Peak flow reading if indicated, use of any new meds such as antibiotic or Beta Blocker, if antihistamine had been taken within the previous 24 hours, and length of time since last allergy injection(s).    The allergy injection(s) were then administered per the build up schedule.  Patients dose was advanced.(reduced, repeated, advanced).   Patient received 0.30 of 1:100 Aq Tree/Grass/Weed/Mites .( dose, concentration, name of antigen).  Suzan Slick, RN

## 2014-08-29 ENCOUNTER — Ambulatory Visit: Payer: BC Managed Care – PPO

## 2014-08-29 DIAGNOSIS — J45909 Unspecified asthma, uncomplicated: Secondary | ICD-10-CM

## 2014-08-29 NOTE — Nursing Note (Signed)
Patient here for Immunotherapy.  Patient and antigen were identified with 2 identifiers, including patient's confirmation of correct antigen bottle(s).  Patient's condition was then assessed, which included any immediate or delayed reaction to previous dose, current health status, Peak flow reading if indicated, use of any new meds such as antibiotic or Beta Blocker, if antihistamine had been taken within the previous 24 hours, and length of time since last allergy injection(s).    The allergy injection(s) were then administered per the build up schedule.  Patients dose was advanced.(reduced, repeated, advanced).   Patient received 0.40 of 1:100 Aq Tree/Grass/Weed/mites .( dose, concentration, name of antigen).  Suzan Slick, RN      After 30 minutes observation, Patient's injection sites were checked for any local reaction. Patient then left the clinic feeling fine.  Local reaction was 3/36 (wheal/erythema).  Suzan Slick, RN

## 2014-08-31 ENCOUNTER — Ambulatory Visit: Payer: BC Managed Care – PPO

## 2014-08-31 DIAGNOSIS — J45909 Unspecified asthma, uncomplicated: Secondary | ICD-10-CM

## 2014-08-31 NOTE — Nursing Note (Signed)
Patient here for Immunotherapy.  Patient and antigen were identified with 2 identifiers, including patient's confirmation of correct antigen bottle(s).  Patient's condition was then assessed, which included any immediate or delayed reaction to previous dose, current health status, Peak flow reading if indicated, use of any new meds such as antibiotic or Beta Blocker, if antihistamine had been taken within the previous 24 hours, and length of time since last allergy injection(s).    The allergy injection(s) were then administered per the build up schedule.  Patients dose was advanced.(reduced, repeated, advanced).   Patient received 0.50 of 1:100 Aq Tree/Grass/Weed/Mites .( dose, concentration, name of antigen).  Suzan Slick, RN

## 2014-08-31 NOTE — Nursing Note (Signed)
After 30 minutes observation, Patient's injection sites were checked for any local reaction. Patient then left the clinic feeling fine.  Local reaction was 3/37 (wheal/erythema).  Suzan Slick, RN

## 2014-09-05 ENCOUNTER — Ambulatory Visit: Payer: BC Managed Care – PPO

## 2014-09-05 DIAGNOSIS — J3089 Other allergic rhinitis: Secondary | ICD-10-CM

## 2014-09-05 DIAGNOSIS — J301 Allergic rhinitis due to pollen: Secondary | ICD-10-CM

## 2014-09-05 NOTE — Nursing Note (Signed)
Patient here for Immunotherapy.  Patient and antigen were identified with 2 identifiers, including patient's confirmation of correct antigen bottle(s).  Patient's condition was then assessed, which included any immediate or delayed reaction to previous dose, current health status, Peak flow reading if indicated, use of any new meds such as antibiotic or Beta Blocker, if antihistamine had been taken within the previous 24 hours, and length of time since last allergy injection(s).    The allergy injection(s) were then administered per the build up schedule.  Patients dose was advanced.(reduced, repeated, advanced).   Patient received 0.05 of 1:10 Aq Tree/Grass/Weed/Mites .( dose, concentration, name of antigen).  Suzan Slick, RN

## 2014-09-05 NOTE — Nursing Note (Signed)
After 30 minutes observation, Patient's injection sites were checked for any local reaction. Patient then left the clinic feeling fine.  Local reaction was 5/45 (wheal/erythema).  Renny Remer C Zayden Maffei, RN

## 2014-09-12 ENCOUNTER — Ambulatory Visit: Payer: BC Managed Care – PPO

## 2014-09-12 DIAGNOSIS — J45909 Unspecified asthma, uncomplicated: Secondary | ICD-10-CM

## 2014-09-12 NOTE — Nursing Note (Signed)
Patient here for Immunotherapy.  Patient and antigen were identified with 2 identifiers, including patient's confirmation of correct antigen bottle(s).  Patient's condition was then assessed, which included any immediate or delayed reaction to previous dose, current health status, Peak flow reading if indicated, use of any new meds such as antibiotic or Beta Blocker, if antihistamine had been taken within the previous 24 hours, and length of time since last allergy injection(s).    The allergy injection(s) were then administered per the build up schedule.  Patients dose was advanced.(reduced, repeated, advanced).   Patient received 0.10 of 1:10 Aq Tree/Grass/Weed/Mites .( dose, concentration, name of antigen).  Suzan Slick, RN

## 2014-09-12 NOTE — Nursing Note (Signed)
After 30 minutes observation, Patient's injection sites were checked for any local reaction. Patient then left the clinic feeling fine.  Local reaction was 4/37 (wheal/erythema).  Taffy Delconte C Ellijah Leffel, RN

## 2014-09-19 ENCOUNTER — Ambulatory Visit: Payer: BC Managed Care – PPO

## 2014-09-21 ENCOUNTER — Ambulatory Visit: Payer: BC Managed Care – PPO

## 2014-09-26 ENCOUNTER — Ambulatory Visit: Payer: BC Managed Care – PPO

## 2014-09-27 ENCOUNTER — Ambulatory Visit: Payer: BC Managed Care – PPO

## 2014-09-27 DIAGNOSIS — J45909 Unspecified asthma, uncomplicated: Secondary | ICD-10-CM

## 2014-09-27 NOTE — Nursing Note (Signed)
Patient here for Immunotherapy.  Patient and antigen were identified with 2 identifiers, including patient's confirmation of correct antigen bottle(s).  Patient's condition was then assessed, which included any immediate or delayed reaction to previous dose, current health status, Peak flow reading if indicated, use of any new meds such as antibiotic or Beta Blocker, if antihistamine had been taken within the previous 24 hours, and length of time since last allergy injection(s).      The allergy injection(s) were then administered per the build up schedule.  Patients dose was  increased.  (reduced, repeated, advanced).    Patient received in  Right arm: Aqueous Trees/Grass/Weeds/Mites 1:10 0.20   mL.    After 30 minutes observation, Patient's injection sites were checked for any local reaction, and patient then left the clinic feeling fine.  Local reaction was 18/80.      Injection tolerated well denies itching or other symptoms.   Orpah Melter, RN

## 2014-10-03 ENCOUNTER — Ambulatory Visit: Payer: BC Managed Care – PPO

## 2014-10-03 DIAGNOSIS — J45909 Unspecified asthma, uncomplicated: Secondary | ICD-10-CM

## 2014-10-03 NOTE — Nursing Note (Signed)
Patient here for Immunotherapy.  Patient and antigen were identified with 2 identifiers, including patient's confirmation of correct antigen bottle(s).  Patient's condition was then assessed, which included any immediate or delayed reaction to previous dose, current health status, Peak flow reading if indicated, use of any new meds such as antibiotic or Beta Blocker, if antihistamine had been taken within the previous 24 hours, and length of time since last allergy injection(s).    The allergy injection(s) were then administered per the build up schedule.  Patients dose was repeated due to large amount of swelling lasting 24 hours after last IT.(reduced, repeated, advanced).   Patient received 0.20 of 1:10 Aq Tree/Grass/Weed/Mites in left arm .( dose, concentration, name of antigen).  Suzan Slick, RN

## 2014-10-03 NOTE — Nursing Note (Signed)
After 30 minutes observation, Patient's injection sites were checked for any local reaction. Patient then left the clinic feeling fine.  Local reaction was 5/50 (wheal/erythema).  Suzan Slick, RN

## 2014-10-09 ENCOUNTER — Telehealth: Payer: Self-pay | Admitting: Family Medicine

## 2014-10-09 DIAGNOSIS — J301 Allergic rhinitis due to pollen: Principal | ICD-10-CM

## 2014-10-09 NOTE — Telephone Encounter (Signed)
Patient called and stated she previous had [facility] and as of 10/13/14 she will have [facility]. She has an appointment scheduled on 10/17/14 for an allergy shot and was told to request a new allergy referral using [facility] as her new insurance to see Dr. Karis Juba as well. Please call with status.    Rebecca Warner  [facility] II

## 2014-10-09 NOTE — Telephone Encounter (Signed)
Patient has been notified. Rebecca Wiswell Heredia Martinez, MA

## 2014-10-09 NOTE — Telephone Encounter (Signed)
Please inform patient new referral has been placed. Thanks, LB

## 2014-10-10 ENCOUNTER — Ambulatory Visit: Payer: BC Managed Care – PPO

## 2014-10-17 ENCOUNTER — Ambulatory Visit: Payer: Commercial Managed Care - HMO

## 2014-10-17 DIAGNOSIS — J45909 Unspecified asthma, uncomplicated: Secondary | ICD-10-CM

## 2014-10-17 NOTE — Nursing Note (Signed)
After 30 minutes observation, Patient's injection sites were checked for any local reaction. Patient then left the clinic feeling fine.  Local reaction was 3/33 (wheal/erythema).  Suzan Slick, RN

## 2014-10-17 NOTE — Nursing Note (Signed)
Patient here for Immunotherapy.  Patient and antigen were identified with 2 identifiers, including patient's confirmation of correct antigen bottle(s).  Patient's condition was then assessed, which included any immediate or delayed reaction to previous dose, current health status, Peak flow reading if indicated, use of any new meds such as antibiotic or Beta Blocker, if antihistamine had been taken within the previous 24 hours, and length of time since last allergy injection(s).    The allergy injection(s) were then administered per the build up schedule.  Patients dose was advanced.(reduced, repeated, advanced).   Patient received 0.25 of 1:10 Aq Tree/Grass/Weed/Mites in right arm .( dose, concentration, name of antigen).  Suzan Slick, RN

## 2014-10-23 ENCOUNTER — Telehealth: Payer: Self-pay | Admitting: Rheumatology

## 2014-10-23 NOTE — Telephone Encounter (Signed)
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 Karis Juba for rheumatology at this time due to international travel, but patient states that she would like Rebecca Warner to retain her serum since patient might start back with injections in a month.    Please call back please advise

## 2014-10-23 NOTE — Telephone Encounter (Signed)
Forwarded to Julie W. RN.    Rebecca Bethune, MA

## 2014-10-24 ENCOUNTER — Ambulatory Visit: Payer: Commercial Managed Care - HMO

## 2014-11-01 ENCOUNTER — Ambulatory Visit: Payer: Commercial Managed Care - HMO

## 2014-11-07 ENCOUNTER — Ambulatory Visit: Payer: Commercial Managed Care - HMO

## 2015-02-12 ENCOUNTER — Ambulatory Visit: Payer: Self-pay | Attending: Otolaryngology | Admitting: Otolaryngology

## 2015-02-12 DIAGNOSIS — H57819 Brow ptosis, unspecified: Secondary | ICD-10-CM

## 2015-02-12 DIAGNOSIS — H02833 Dermatochalasis of right eye, unspecified eyelid: Secondary | ICD-10-CM

## 2015-02-12 DIAGNOSIS — L719 Rosacea, unspecified: Secondary | ICD-10-CM

## 2015-02-12 DIAGNOSIS — H02836 Dermatochalasis of left eye, unspecified eyelid: Secondary | ICD-10-CM

## 2015-02-12 DIAGNOSIS — R238 Other skin changes: Secondary | ICD-10-CM

## 2015-02-12 NOTE — Progress Notes (Signed)
Patient identification verified x 2. Allergies checked. Pain level and barriers assessed. Medication reconciled.Rebecca Warner, [name] LVN

## 2015-02-12 NOTE — Progress Notes (Addendum)
PATIENT: Rebecca Warner LOCATION: [facility]   MR #: [id number] SEX: female  AGE: 10yr  DATE OF SERVICE: 02/12/2015 DOB: Nov 20, 1960    OTOLARYNGOLOGY/FACIAL PLASTIC SURGERY CLINIC NOTE    CHIEF COMPLAINT:  Aging face    HISTORY OF PRESENT ILLNESS:    Rebecca Warner is a 30yr year old female with concerns of aging face and interest in discussion of her options. She still complains of "heavy feeling to eyes" and bags to eyes. She presents today with interest in discussing blepharoplasty. She has no trouble with vision but notes that her grandmother did have trouble with vision due to eyelids. 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     Rebecca Warner has not had change in smell or taste, no fevers, chills, night sweats.  No rhinorrhea. Rebecca Warner denies odynophagia, dysphagia or otalgia.    PAST MEDICAL HISTORY.    Past Medical History   Diagnosis Date    Back pain        PAST SURGICAL HISTORY    Past Surgical History   Procedure Laterality Date    Pr cesarean delivery only       C-section, low cervical x 2       ALLERGIES    Amoxicillin and Pcn (penicillin)    MEDICATIONS    Current Outpatient Prescriptions   Medication Sig    Aspirin 325 mg Tablet Tablet     Aspirin, Buffered (BUFFERIN) 325 mg Tablet Tablet Take 650 mg by mouth two times daily if needed.    B Complex Vitamins (B-50 COMPLEX) Tablet Take  by mouth every morning.    Cholecalciferol, Vitamin D3, (VITAMIN D-3) 2,000 unit Tablet Take  by mouth.    Cyanocobalamin (VITAMIN B-12) 500 mcg Lozenge Take  by mouth.    DIPHENHYDRAMINE HCL (BENADRYL PO)     Ketotifen Fumarate (ZADITOR) 0.025 % Ophthalmic Solution     LORATADINE PO     PROGESTERONE MISC Apply  to the affected area. Bid     No current facility-administered medications for this visit.       SOCIAL HISTORY:  Social History     Social History    Marital Status: MARRIED     Spouse Name: N/A    Number of Children: 2    Years of Education: N/A     Occupational History     Airline pilot      Social History Main Topics    Smoking status: Never Smoker     Smokeless tobacco: Never Used    Alcohol Use: Yes      Comment: 1 glass at night    Drug Use: No    Sexual Activity: Not on file     History   Substance Use Topics    Smoking status: Never Smoker     Smokeless tobacco: Never Used    Alcohol Use: Yes      Comment: 1 glass at night       I did review patient's past medical and family/social history, no changes noted.     Physical examination:    The patient is a well-developed, well- nourished 54yr-old female in no acute distress.    Psychiatric:  Alert and oriented x3.  The patient has a normal affect.    Skin:  The patient shows no signs of skin irritation, rash, or erythema.    HEENT exam:  Normocephalic, atraumatic.      Eyes:  Pupils equally round and reactive to  light, extraocular movements are intact, conjunctiva and sclera are clear.     On right, pupil is 51mm and reactive. On left, pupil is 34mm and reactive.     R MRD-1: 22mm  L MRD-1: 31mm    R MRD-2 103mm  L MRD-2: 58mm     Levator function R: 12mm  Levator function L: 23mm      Oral cavity/oropharynx:  Moderate brow ptosis with mild dermatochalasis to bilateral upper eyelids    Ears:   External ears normal     Cardiovascular: Regular rate and rhythm    Pulmonary: Lungs clear to auscultation    Neurologic exam:  Cranial nerves II-XII are grossly intact.  Facial sensation and motion are normal.  There is normal palatal motion.      Musculoskeletal: The trapezius and sternocleidomastoid muscles are equal bilaterally and the tongue is midline.      The neck is without lymphadenopathy.      Assessment:: Rebecca Warner is a 51yr year old female with aging face interested in discussing blepharoplasty.     1. Dermatochalasis of both eyelids    2. Facial aging    3. Rosacea    4. Brow ptosis       RECOMMENDATIONS:  Counselling and discussion with patient included   1)probable  Diagnosis,  2) probable  treatment options, and    3)  recommendations for management with the following:  Orders Placed This Encounter    Surgical Case Request: BLEPHAROPLASTY     Discussed blepharoplasty with the patient including potential outcomes and risks. We reviewed the previous discussion of possible endoscopic browlift but at this time she would prefer an approach to the lower eyelid and upper eyelids under a conscious sedation/MAC.       We reviewed the risks benefits and alternatives including but not limited to bleeding, infection, need for further surgery, vision change, vision loss, scarring, numbness, hematoma, poor cosmetic outcome as well as the alternatives and benefits, the patient expressed understanding and wishes to proceed.    No future appointments.    Barriers to Learning assessed: none. Patient verbalizes understanding of teaching and instructions.    SCRIBE DISCLAIMER:  This note was scribed by Joline Maxcy, a trained medical scribe, in the presence of Dr. Hinda Kehr.    PROVIDER DISCLAIMER:  This document serves as my personal record of services taken in my presence. It was created on 02/12/2015 on my behalf by Joline Maxcy, a trained medical scribe.    This encounter was supervised by Dr. Hinda Kehr. We saw and examined the patient together and jointly developed the above plan.    Electronically Signed By:  Enedina Finner, MD  Otolaryngology - PGY4  PI# [id number]  Personal Pager [phone]  Service Pager [phone]      The patient was seen and examined with the resident.  I agree with the documented history and physical examination.  We formulated the plan together.    Electronically signed by:    Serena Colonel. Hinda Kehr, M.D., M.P.H., F.A.C.S.  Professor   Facial Plastic and Reconstructive Surgery  Department of Otolaryngology   PI#: [phone]  Pager: [phone]

## 2015-02-13 DIAGNOSIS — H02833 Dermatochalasis of right eye, unspecified eyelid: Secondary | ICD-10-CM | POA: Insufficient documentation

## 2015-02-13 DIAGNOSIS — H02836 Dermatochalasis of left eye, unspecified eyelid: Secondary | ICD-10-CM

## 2015-02-13 DIAGNOSIS — H57819 Brow ptosis, unspecified: Secondary | ICD-10-CM | POA: Insufficient documentation

## 2015-02-13 NOTE — Progress Notes (Signed)
The patient was seen and examined with the resident.  I agree with the documented history and physical examination.  We formulated the plan together.    Electronically signed by:    Gaberial Cada T. Jaquann Guarisco, M.D., M.P.H., F.A.C.S.  Professor   Facial Plastic and Reconstructive Surgery  Department of Otolaryngology   PI#: [id number]  Pager: [phone]

## 2015-03-27 ENCOUNTER — Ambulatory Visit: Payer: Commercial Managed Care - HMO | Admitting: Family Medicine

## 2015-03-27 ENCOUNTER — Other Ambulatory Visit: Payer: Self-pay | Admitting: Family Medicine

## 2015-03-27 ENCOUNTER — Ambulatory Visit
Admission: RE | Admit: 2015-03-27 | Discharge: 2015-03-27 | Disposition: A | Discharge status: Home / Self Care | Payer: Commercial Managed Care - HMO | Source: Ambulatory Visit | Attending: Diagnostic Radiology | Admitting: Diagnostic Radiology

## 2015-03-27 ENCOUNTER — Ambulatory Visit: Payer: Commercial Managed Care - HMO | Attending: Family Medicine

## 2015-03-27 ENCOUNTER — Encounter: Payer: Self-pay | Admitting: Family Medicine

## 2015-03-27 VITALS — BP 140/78 | HR 83 | Temp 98.2°F | Resp 20 | Wt 136.0 lb

## 2015-03-27 DIAGNOSIS — R05 Cough: Principal | ICD-10-CM

## 2015-03-27 DIAGNOSIS — R5383 Other fatigue: Principal | ICD-10-CM | POA: Insufficient documentation

## 2015-03-27 DIAGNOSIS — R059 Cough, unspecified: Secondary | ICD-10-CM

## 2015-03-27 LAB — CBC WITH DIFFERENTIAL
BASOPHILS % AUTO: 0.3 %
BASOPHILS ABS AUTO: 0 10*3/uL (ref 0–0.2)
EOSINOPHIL % AUTO: 0.2 %
EOSINOPHIL ABS AUTO: 0 10*3/uL (ref 0–0.5)
HEMATOCRIT: 43.7 % (ref 36–46)
HEMOGLOBIN: 14.4 g/dL (ref 12.0–16.0)
LYMPHOCYTE ABS AUTO: 1 10*3/uL (ref 1.0–4.8)
LYMPHOCYTES % AUTO: 10.1 %
MCH: 29.4 pg (ref 27–33)
MCHC: 32.9 % (ref 32–36)
MCV: 89.3 UM3 (ref 80–100)
MONOCYTES % AUTO: 8.9 %
MONOCYTES ABS AUTO: 0.9 10*3/uL — AB (ref 0.1–0.8)
MPV: 8.8 UM3 (ref 6.8–10.0)
NEUTROPHIL ABS AUTO: 7.7 10*3/uL (ref 1.80–7.70)
NEUTROPHILS % AUTO: 80.5 %
PLATELET COUNT: 268 10*3/uL (ref 130–400)
RDW: 13.2 U (ref 0–14.7)
RED CELL COUNT: 4.89 10*6/uL (ref 4.0–5.2)
WHITE BLOOD CELL COUNT: 9.6 10*3/uL (ref 4.5–11.0)

## 2015-03-27 LAB — VITAMIN D, 25 HYDROXY: VITAMIN D, 25 HYDROXY: 21.6 ng/mL — AB (ref 30.0–100.0)

## 2015-03-27 LAB — COMPREHENSIVE METABOLIC PANEL
ALANINE TRANSFERASE (ALT): 18 U/L (ref 5–54)
ALBUMIN: 4.2 g/dL (ref 3.4–4.8)
ALKALINE PHOSPHATASE (ALP): 33 U/L — AB (ref 35–115)
ASPARTATE TRANSAMINASE (AST): 17 U/L (ref 15–43)
BILIRUBIN TOTAL: 0.3 mg/dL (ref 0.3–1.3)
CALCIUM: 9.2 mg/dL (ref 8.6–10.5)
CARBON DIOXIDE TOTAL: 26 meq/L (ref 24–32)
CHLORIDE: 104 meq/L (ref 95–110)
CREATININE BLOOD: 0.7 mg/dL (ref 0.44–1.27)
GLUCOSE: 98 mg/dL (ref 70–99)
POTASSIUM: 4.2 meq/L (ref 3.3–5.0)
PROTEIN: 6.9 g/dL (ref 6.3–8.3)
SODIUM: 138 meq/L (ref 135–145)
UREA NITROGEN, BLOOD (BUN): 9 mg/dL (ref 8–22)

## 2015-03-27 LAB — THYROXINE, FREE (FREE T4): THYROXINE, FREE (FREE T4): 0.92 ng/dL (ref 0.56–1.64)

## 2015-03-27 LAB — THYROID STIMULATING HORMONE: THYROID STIMULATING HORMONE: 1.04 u[IU]/mL (ref 0.35–3.30)

## 2015-03-27 NOTE — Progress Notes (Signed)
Chief Complaint   Patient presents with    Pneumonia        SUBJECTIVE: Rebecca Warner is a 54yr old female who presents   With fatigue x 7 days, last night very deep cough with foul taste in mouth.  His son diagnosed with pneumonia 9 days ago.  Failed Zithromax, is responding to Augmentin.  Patient gets rash with amoxicillin.  History of asthma, denies wheezing or shortness of breath.  O2 sat 99%.    family history includes Cancer in her maternal grandmother; Heart in her paternal grandfather; Hypertension in her paternal grandmother; Non-contributory in her father; Thyroid in her mother.  History   Substance Use Topics    Smoking status: Never Smoker     Smokeless tobacco: Never Used    Alcohol Use: Yes      Comment: 1 glass at night     Past Surgical History   Procedure Laterality Date    Pr cesarean delivery only       C-section, low cervical x 2     Family History   Problem Relation Age of Onset    Thyroid Mother      goiter as a child    Non-contributory Father     Hypertension Paternal Grandmother     Cancer Maternal Grandmother     Heart Paternal Grandfather      Patient Active Problem List   Diagnosis    Rosacea    Low vitamin B12 level    Degenerative cervical disc    Cervical radicular pain    Allergic rhinitis due to allergen    Acute atopic conjunctivitis    Asthma    Allergy to mites    Dermatochalasis of both eyelids    Brow ptosis       Current outpatient prescriptions:     Aspirin 325 mg Tablet Tablet, , Disp: , Rfl:     Aspirin, Buffered (BUFFERIN) 325 mg Tablet Tablet, Take 650 mg by mouth two times daily if needed., Disp: , Rfl:     B Complex Vitamins (B-50 COMPLEX) Tablet, Take  by mouth every morning., Disp: 30 tablet, Rfl:     Cholecalciferol, Vitamin D3, (VITAMIN D-3) 2,000 unit Tablet, Take  by mouth., Disp: , Rfl:     Cyanocobalamin (VITAMIN B-12) 500 mcg Lozenge, Take  by mouth., Disp: , Rfl:     DIPHENHYDRAMINE HCL (BENADRYL PO), , Disp: , Rfl:     Ketotifen  Fumarate (ZADITOR) 0.025 % Ophthalmic Solution, , Disp: , Rfl:     LORATADINE PO, , Disp: , Rfl:     PROGESTERONE MISC, Apply  to the affected area. Bid, Disp: , Rfl:      R.O.S. temp 99.5 MAXIMUM TEMPERATURE  BP 140/78 mmHg  Pulse 83  Temp(Src) 36.8 C (98.2 F) (Tympanic)  Resp 20  Wt 61.689 kg (136 lb)  LMP 03/15/2015     OBJECTIVE: General Appearance: Alert, oriented, cooperative, no significant distress.  Ear exam - both sides normal, TM intact without perforation or effusion, external canal normal. No significant cerumenosis noted.  Eyes: normal conjunctiva, no periorbital redness or edema.  Mouth:No erythema, exudate or tonsillar abnormalities.  Heart: Regular rate and rhythm. No murmurs, extra heart sounds.  Lungs: Good air entry, no rales or wheezing, no egophony.      ASSESSMENT:    (R05) Cough  (primary encounter diagnosis)  Comment: Rule out pneumonia  Plan: CHEST 4+ VIEWS  Call if symptoms worsen, or fail to resolve  as anticipated.     (R53.83) Other fatigue  Comment: see above   Plan: CBC WITH DIFFERENTIAL, COMPREHENSIVE METABOLIC         PANEL, THYROID STIMULATING HORMONE, THYROXINE,         FREE (FREE T4), VITAMIN D, 25 HYDROXY         I reviewed patient's past medical and social history.  Barriers to Learning assessed: none. Patient verbalizes understanding of teaching and instructions.  Rebecca Coffie Eliezer Bottom, MD

## 2015-03-27 NOTE — Nursing Note (Signed)
Vital signs taken, allergies verified, screened for pain.  Somya Jauregui, MA

## 2015-03-30 ENCOUNTER — Ambulatory Visit: Payer: Commercial Managed Care - HMO | Admitting: Family Medicine

## 2015-04-04 ENCOUNTER — Encounter: Payer: Self-pay | Admitting: Family Medicine

## 2015-04-04 ENCOUNTER — Ambulatory Visit: Payer: Commercial Managed Care - HMO | Admitting: Family Medicine

## 2015-04-04 VITALS — BP 160/96 | HR 84 | Temp 97.8°F | Wt 135.6 lb

## 2015-04-04 DIAGNOSIS — R05 Cough: Secondary | ICD-10-CM

## 2015-04-04 DIAGNOSIS — J189 Pneumonia, unspecified organism: Secondary | ICD-10-CM

## 2015-04-04 DIAGNOSIS — J181 Lobar pneumonia, unspecified organism: Principal | ICD-10-CM

## 2015-04-04 LAB — POC RAPID STREP A
POC RAPID STREP A LOT#: 4100009
POC RAPID STREP A: NEGATIVE

## 2015-04-04 MED ORDER — FLUTICASONE-SALMETEROL 100 MCG-50 MCG/DOSE DISK POWDER FOR ORAL INHALATION: 1.0000 | DISK | Freq: Two times a day (BID) | RESPIRATORY_TRACT | Status: DC

## 2015-04-04 MED ORDER — LEVOFLOXACIN 500 MG TABLET: ORAL_TABLET | ORAL | Status: AC

## 2015-04-04 NOTE — Nursing Note (Signed)
Vital signs taken, allergies verified, screened for pain.  Lalaine Overstreet, MA I

## 2015-04-04 NOTE — Progress Notes (Addendum)
ID/CC:Rebecca Warner is a 54yr old female here for cough    S:pt is here for ongoing cough.  Was seen 03/29/15 and underwent CXR which was neg for PNA.  Has had son with PNA/ pleural effusion who failed z-pak, other sone with grp A strept on azithro and husband with sinusitis who is on z-apk   Has been coughing with intermittent fever.  No improving. + fatigue.  + SHORTNESS OF BREATH.  + pleuritic CHEST PAIN.     Allergies:   Allergies   Allergen Reactions    Amoxicillin Hives    Pcn (Penicillin) [Penicillins] Rash       Meds: Reviewed and updated in EPIC    Patient Active Problem List   Diagnosis    Rosacea    Low vitamin B12 level    Degenerative cervical disc    Cervical radicular pain    Allergic rhinitis due to allergen    Acute atopic conjunctivitis    Asthma    Allergy to mites    Dermatochalasis of both eyelids    Brow ptosis       ROS:   All other systems negative except as noted in the HPI.    OBJECTIVE:  BP 160/96 mmHg  Pulse 84  Temp(Src) 36.6 C (97.8 F) (Tympanic)  Wt 61.5 kg (135 lb 9.3 oz)  SpO2 99%  LMP 03/15/2015  Gen: looks ill and coughing  EENT:  Eyes:PERRLA, EOMi, conj - clear; sclera-clear  Canals: without abnormalities  Tms: normal  Nose: nasal mucus inflammed w/o purulent secretions  Throat: moist injected, without  purulent discharge  Neck: supple without adenopathy   Thyroid: not enlarged, no nodules  Chest: no retractions, + RML/RLL; + rhonchi; no wheeze  Cardiac:  RRR w/o murmurs, rubs, or gallops   SKIN: no rash, ulcers,or lesion; warm and dry    Rapid Strept - neg    ASSESSMENT AND PLAN:  1. Cough - suspicious for RML or RLL PNA.  Will placed on Levaquin.  Advair was sent.  Discussed repeat CXR but would not change elected management with Levaquin which she has tolerated well in the past.  Will follow up if not improving.       I did not review patient's past medical and family/social history.    Barriers to Learning assessed: none. Patient verbalizes understanding  of teaching and instructions.    Electronically signed by  Grace Isaac. Kemiah Booz, [facility]  [address], [place] [address]  [phone]

## 2015-04-04 NOTE — Addendum Note (Signed)
Addended by: Julious Oka. on: 04/04/2015 12:14 PM     Modules accepted: Level of Service

## 2015-04-11 ENCOUNTER — Encounter: Payer: Self-pay | Admitting: Family Medicine

## 2015-04-11 ENCOUNTER — Ambulatory Visit: Payer: Commercial Managed Care - HMO | Admitting: Family Medicine

## 2015-04-11 VITALS — BP 157/88 | HR 82 | Temp 98.5°F | Wt 136.5 lb

## 2015-04-11 DIAGNOSIS — J159 Unspecified bacterial pneumonia: Principal | ICD-10-CM

## 2015-04-11 DIAGNOSIS — R05 Cough: Secondary | ICD-10-CM

## 2015-04-11 MED ORDER — LEVOFLOXACIN 500 MG TABLET: ORAL_TABLET | ORAL | Status: AC

## 2015-04-11 MED ORDER — BUDESONIDE-FORMOTEROL HFA 80 MCG-4.5 MCG/ACTUATION AEROSOL INHALER: 2.0000 | INHALATION_SPRAY | Freq: Two times a day (BID) | RESPIRATORY_TRACT | Status: DC

## 2015-04-11 NOTE — Nursing Note (Signed)
Vital signs taken, allergies verified, screened for pain.  Tyrone Pautsch, MA I

## 2015-04-11 NOTE — Progress Notes (Signed)
ID/CC:Rebecca Warner is a 54yr old female here for follow up cough and questionable PNA    S:ppt has been on levaquin x 7 days for presumed PNA.  Is here today as feels better but thought that she heard rales with her stethoscope and had her son's pediatrician listen to her, who also though that she might want to follow up. She thought by this time that she should be feeling much better and her PNA should be resolved.  No fever,chills or shakes.  Less cough but still coughing.  Is using albuterol on occasion.  Is not using advair as did not like the powder in the back of her throat.  Not as fatigued.     Allergies:   Allergies   Allergen Reactions    Amoxicillin Hives    Pcn (Penicillin) [Penicillins] Rash       Meds: Reviewed and updated in EPIC    Patient Active Problem List   Diagnosis    Rosacea    Low vitamin B12 level    Degenerative cervical disc    Cervical radicular pain    Allergic rhinitis due to allergen    Acute atopic conjunctivitis    Asthma    Allergy to mites    Dermatochalasis of both eyelids    Brow ptosis       ROS:   All other systems negative except as noted in the HPI.    OBJECTIVE:  BP 157/88 mmHg  Pulse 82  Temp(Src) 36.9 C (98.5 F) (Tympanic)  Wt 61.9 kg (136 lb 7.4 oz)  SpO2 98%  LMP 03/15/2015  Gen: looks ill and coughing  EENT:  Eyes:PERRLA, EOMi, conj - clear; sclera-clear  Canals: without abnormalities  Tms: normal  Nose: nasal mucus inflammed w/o purulent secretions  Throat: moist injected, without  purulent discharge  Neck: supple without adenopathy   Thyroid: not enlarged, no nodules  Chest: no retractions, + scant rales RML/RLL; + rhonchi; no wheeze  Cardiac:  RRR w/o murmurs, rubs, or gallops   SKIN: no rash, ulcers,or lesion; warm and dry      ASSESSMENT AND PLAN:  1. Cough - still suspicious for RML or RLL PNA.  On Levaquin day #7.  Offered CXR but patient prefers to want until day #10 Levquin.  If not better will have done. Will switch advair to  symbicort as intolerant of advair powder.  Will follow up if not improving after CXR.  Sent additional 4 days of Levaquin if not better after 10 days and pending CXR results.  Discussed may have to consider prednsione.       I did not review patient's past medical and family/social history.    Barriers to Learning assessed: none. Patient verbalizes understanding of teaching and instructions.    Electronically signed by  Grace Isaac. Leba Tibbitts, [facility]  [address], [place] [address]  [phone]

## 2015-04-24 ENCOUNTER — Ambulatory Visit: Payer: Commercial Managed Care - HMO | Admitting: Rheumatology

## 2015-04-24 ENCOUNTER — Encounter: Payer: Self-pay | Admitting: Rheumatology

## 2015-04-24 VITALS — BP 141/86 | HR 72 | Temp 98.0°F | Wt 136.2 lb

## 2015-04-24 DIAGNOSIS — H1010 Acute atopic conjunctivitis, unspecified eye: Secondary | ICD-10-CM

## 2015-04-24 DIAGNOSIS — H109 Unspecified conjunctivitis: Secondary | ICD-10-CM

## 2015-04-24 DIAGNOSIS — J45909 Unspecified asthma, uncomplicated: Secondary | ICD-10-CM

## 2015-04-24 DIAGNOSIS — J301 Allergic rhinitis due to pollen: Secondary | ICD-10-CM

## 2015-04-24 DIAGNOSIS — J4520 Mild intermittent asthma, uncomplicated: Secondary | ICD-10-CM

## 2015-04-24 DIAGNOSIS — M19041 Primary osteoarthritis, right hand: Secondary | ICD-10-CM

## 2015-04-24 NOTE — Progress Notes (Signed)
The patient was seen and examined with Dr. Farrel Demark, clinical fellow.  The case was discussed and I agree with his note and mutually deride planned.  The patient has a complicated history with joint pains in the right arm when immunotherapy was given in that arm but not on the left.  Of note the patient had cervical radicular symptoms of the left arm.  The options of management were discussed.  The patient will consider restart of her immunotherapy to be given only in the left arm.

## 2015-04-24 NOTE — Progress Notes (Signed)
ALLERGY AND IMMUNOLOGY  Follow  Up :        We had the pleasure of seeing  Rebecca Warner in consultation today.  Rebecca Warner is a pleasant 54yr year old female identified by name and date of birth who complains of    HPI :    Hx of allergic rhinoconjunctivitis on   Immunotherapy since  Sept   2015  [place]  In jan 2016    sec to Arthritis.  After stopping  Immunotherapy noted some  Improvement in  Arthritis.    takes claritin daily  Now   RAST panel testing was notable for class IV Timothy; class III [place], Mountain juniper, pigweed; class II mite, olive, walnut; class I Elm, lambs quarter, mugwort. Food RAST testing was class II peanut; class 1 crab, wheat. Cashew was negative.  Interval hx:   no hives, no  Hx of angio edema like event since last visit.   After stopping immunotherapy some improvement in arthritis.    also noticed local erythema in rt  Arm with allergy shots.       ROS: All systems negative except for those mentioned in HPI  Previous allergy treatment   Patient Active Problem List   Diagnosis    Rosacea    Low vitamin B12 level    Degenerative cervical disc    Cervical radicular pain    Allergic rhinitis due to allergen    Acute atopic conjunctivitis    Asthma    Allergy to mites    Dermatochalasis of both eyelids    Brow ptosis       Current Outpatient Prescriptions   Medication Sig Dispense Refill    Aspirin 325 mg Tablet Tablet       Aspirin, Buffered (BUFFERIN) 325 mg Tablet Tablet Take 650 mg by mouth two times daily if needed.      B Complex Vitamins (B-50 COMPLEX) Tablet Take  by mouth every morning. 30 tablet     Budesonide/Formoterol (SYMBICORT) 80-4.5 mcg/actuation HFA Aerosol Inhaler Inhaler Take 2 puffs by inhalation 2 times daily. 1 inhaler 0    Cholecalciferol, Vitamin D3, (VITAMIN D-3) 2,000 unit Tablet Take  by mouth.      Cyanocobalamin (VITAMIN B-12) 500 mcg Lozenge Take  by mouth.      DIPHENHYDRAMINE HCL (BENADRYL PO)       Ketotifen Fumarate (ZADITOR)  0.025 % Ophthalmic Solution       LORATADINE PO       PROGESTERONE MISC Apply  to the affected area. Bid       No current facility-administered medications for this visit.       Allergies   Allergen Reactions    Amoxicillin Hives    Pcn (Penicillin) [Penicillins] Rash        Past Medical History   Diagnosis Date    Back pain        Past Surgical History   Procedure Laterality Date    Pr cesarean delivery only       C-section, low cervical x 2       History   Substance Use Topics    Smoking status: Never Smoker     Smokeless tobacco: Never Used    Alcohol Use: Yes      Comment: 1 glass at night       Family History   Problem Relation Age of Onset    Thyroid Mother      goiter as a child  Non-contributory Father     Hypertension Paternal Grandmother     Cancer Maternal Grandmother     Heart Paternal Grandfather          O:/  BP 141/86 mmHg  Pulse 72  Temp(Src) 36.7 C (98 F) (Tympanic)  Wt 61.8 kg (136 lb 3.9 oz)  LMP 03/15/2015    Gen:  NAD, pleasant  HEENT:  Conjunctivae clear  Nasal passages clear, NO nasal polyps  Oro pharynx: no palatal ulcers   no craniofacial Lymphadenopathy  Neck:Supple, No Lymphadenopathy  Pulm:Clear to auscultation, no wheezes   Cvs: s1 s2 heard  Skin: no rash, no visible urticarial rash     Office Visit on 04/04/2015   Component Date Value Ref Range Status    POC RAPID STREP A 04/04/2015 Negative   Final    POC RAPID STREP A  QC OK? 04/04/2015 OK   Final    POC RAPID STREP A LOT# 04/04/2015 4100009   Final   Lab Visit on 03/27/2015   Component Date Value Ref Range Status    WHITE BLOOD CELL COUNT 03/27/2015 9.6  4.5 - 11.0 K/MM3 Final    RED CELL COUNT 03/27/2015 4.89  4.0 - 5.2 M/MM3 Final    HEMOGLOBIN 03/27/2015 14.4  12.0 - 16.0 g/dL Final    HEMATOCRIT 03/27/2015 43.7  36 - 46 % Final    MCV 03/27/2015 89.3  80 - 100 UM3 Final    MCH 03/27/2015 29.4  27 - 33 pg Final    MCHC 03/27/2015 32.9  32 - 36 % Final    RDW 03/27/2015 13.2  0 - 14.7 UNITS Final     MPV 03/27/2015 8.8  6.8 - 10.0 UM3 Final    PLATELET COUNT 03/27/2015 268  130 - 400 K/MM3 Final    NEUTROPHILS % AUTO 03/27/2015 80.5   Final    LYMPHOCYTES % AUTO 03/27/2015 10.1   Final    MONOCYTES % AUTO 03/27/2015 8.9   Final    EOSINOPHIL % AUTO 03/27/2015 0.2   Final    BASOPHILS % AUTO 03/27/2015 0.3   Final    NEUTROPHIL ABS AUTO 03/27/2015 7.70  1.80 - 7.70 K/MM3 Final    LYMPHOCYTE ABS AUTO 03/27/2015 1.0  1.0 - 4.8 K/MM3 Final    MONOCYTES ABS AUTO 03/27/2015 0.9* 0.1 - 0.8 K/MM3 Final    EOSINOPHIL ABS AUTO 03/27/2015 0  0 - 0.5 K/MM3 Final    BASOPHILS ABS AUTO 03/27/2015 0  0 - 0.2 K/MM3 Final    SODIUM 03/27/2015 138  135 - 145 mEq/L Final    POTASSIUM 03/27/2015 4.2  3.3 - 5.0 mEq/L Final    CHLORIDE 03/27/2015 104  95 - 110 mEq/L Final    CARBON DIOXIDE TOTAL 03/27/2015 26  24 - 32 mEq/L Final    UREA NITROGEN, BLOOD (BUN) 03/27/2015 9  8 - 22 mg/dL Final    CREATININE BLOOD 03/27/2015 0.70  0.44 - 1.27 mg/dL Final    E-GFR, AFRICAN AMERICAN 03/27/2015 >60  >60 SEE NOTE Final    E-GFR, NON-AFRICAN AMERICAN 03/27/2015 >60  >60 SEE NOTE Final    GLUCOSE 03/27/2015 98  70 - 99 mg/dL Final    CALCIUM 03/27/2015 9.2  8.6 - 10.5 mg/dL Final    PROTEIN 03/27/2015 6.9  6.3 - 8.3 g/dL Final    ALBUMIN 03/27/2015 4.2  3.4 - 4.8 g/dL Final    ALKALINE PHOSPHATASE (ALP) 03/27/2015 33* 35 - 115 U/L Final  ASPARTATE TRANSAMINASE (AST) 03/27/2015 17  15 - 43 U/L Final    BILIRUBIN TOTAL 03/27/2015 0.3  0.3 - 1.3 mg/dL Final    ALANINE TRANSFERASE (ALT) 03/27/2015 18  5 - 54 U/L Final    THYROID STIMULATING HORMONE 03/27/2015 1.04  0.35 - 3.30 uIU/mL Final    THYROXINE, FREE (FREE T4) 03/27/2015 0.92  0.56 - 1.64 ng/dL Final    VITAMIN D, 25 HYDROXY 03/27/2015 21.6* 30.0 - 100.0 ng/mL Final         A/P: 54 y/o  With   #  Allergic rhinoconjunctivitis  S/p immunotherapy  From aug 2015 - jan 2016 stopped  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 Food RAST testing was class II peanut; class 1 crab, wheat. Cashew was negative.   rarely immunotherapy  Can unmask an autoimmune illness with Th1 series stimulation and this is possible even with oral immunotherapy like grass tek. Given worsening of arthritis with immunotherapy and improvement  After stopping this is possible.  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    she wishes to  Try  immunotherapy   In left arm for now.   Cont claritin  For other symptoms of allergic rhinoconjunctivitis.         All aspects of the care plan and risks/benefits/side effects were discussed with the patient, and all questions were answered at the end of the visit. The patient was advised to call back or go to the ER for any recurrence or worsening of symptoms.    The patient was seen together with and the above plan jointly developed with the attending physician  Dr Karis Juba     Thank you for the opportunity to participate in this patient's care.    Report electronically signed by  Leisa Lenz MD   Clinical Fellow, Rheumatology  Division of Rheumatology, Allergy and Immunology   PI # [id number] Pager # [phone]

## 2015-04-24 NOTE — Nursing Note (Signed)
Vitals taken, general health questions asked. Asked patient for their pharmacy and verified DOB as well. SHall MA II

## 2015-04-25 ENCOUNTER — Ambulatory Visit: Payer: Self-pay | Attending: Otolaryngology | Admitting: Otolaryngology

## 2015-04-25 VITALS — BP 148/89 | HR 70 | Temp 99.9°F | Ht 64.0 in | Wt 135.0 lb

## 2015-04-25 DIAGNOSIS — H02836 Dermatochalasis of left eye, unspecified eyelid: Secondary | ICD-10-CM | POA: Insufficient documentation

## 2015-04-25 DIAGNOSIS — H57819 Brow ptosis, unspecified: Secondary | ICD-10-CM

## 2015-04-25 DIAGNOSIS — R238 Other skin changes: Secondary | ICD-10-CM | POA: Insufficient documentation

## 2015-04-25 DIAGNOSIS — L908 Other atrophic disorders of skin: Secondary | ICD-10-CM | POA: Insufficient documentation

## 2015-04-25 DIAGNOSIS — H02833 Dermatochalasis of right eye, unspecified eyelid: Principal | ICD-10-CM | POA: Insufficient documentation

## 2015-04-25 DIAGNOSIS — Z01818 Encounter for other preprocedural examination: Secondary | ICD-10-CM | POA: Insufficient documentation

## 2015-04-25 NOTE — Pre-Op/Pre-Procedure Screening (Addendum)
Patient Name: Rebecca Warner  44yr  [birth year]    Scheduled Surgery Date: 05/01/2015  Proposed Surgery: Procedure(s) with comments:  BLEPHAROPLASTY (Bilateral) - upper and lower  Pre-op Dx: Rosacea [L71.9]  Facial aging [R23.8]  Surgeon: Surgeon(s):  Juanito Doom, MD    Problems:  Patient Active Problem List    Diagnosis Date Noted    Dermatochalasis of both eyelids 02/13/2015    Brow ptosis 02/13/2015    Allergy to mites 04/06/2014    Allergic rhinitis due to allergen 04/05/2014    Acute atopic conjunctivitis 04/05/2014    Asthma 04/05/2014    Cervical radicular pain 04/28/2013    Rosacea 12/31/2012    Low vitamin B12 level 12/31/2012    Degenerative cervical disc 12/31/2012       PMH:  Past Medical History   Diagnosis Date    Back pain        PSH:  Past Surgical History   Procedure Laterality Date    Pr cesarean delivery only       C-section, low cervical x 2   Uterine Polyp removal    Anesthetic Hx: PONV  With epidural    Allergies:   Allergies   Allergen Reactions    Amoxicillin Hives    Pcn (Penicillin) [Penicillins] Rash       Medications: .  Outpatient Prescriptions Marked as Taking for the 05/01/15 encounter [facility] Encounter)   Medication Sig Dispense Refill    B Complex Vitamins (B-50 COMPLEX) Tablet Take  by mouth every morning. 30 tablet     Cholecalciferol, Vitamin D3, (VITAMIN D-3) 2,000 unit Tablet Take  by mouth.      Cyanocobalamin (VITAMIN B-12) 500 mcg Lozenge Take  by mouth.      DIPHENHYDRAMINE HCL (BENADRYL PO)       Ketotifen Fumarate (ZADITOR) 0.025 % Ophthalmic Solution       LORATADINE PO       PROGESTERONE MISC Apply  to the affected area. Bid                      Drug/Alcohol Hx:  History   Smoking status    Never Smoker    Smokeless tobacco    Never Used     History   Alcohol Use    Yes     Comment: 1 glass at night     History   Drug Use No       CV Tests:  N/A    Labs:   CBC:     Lab Results  Component Value Date/Time   WHITE BLOOD CELL COUNT 9.6  03/27/2015 1625   RED CELL COUNT 4.89 03/27/2015 1625   HEMOGLOBIN 14.4 03/27/2015 1625   HEMATOCRIT 43.7 03/27/2015 1625   MCV 89.3 03/27/2015 1625   MCH 29.4 03/27/2015 1625   RDW 13.2 03/27/2015 1625   PLATELET COUNT 268 03/27/2015 1625       BMP/CMP:    Lab Results  Component Value Date/Time   SODIUM 138 03/27/2015 1625   POTASSIUM 4.2 03/27/2015 1625   CHLORIDE 104 03/27/2015 1625   CARBON DIOXIDE TOTAL 26 03/27/2015 1625   CREATININE BLOOD 0.70 03/27/2015 1625   E-GFR, AFRICAN AMERICAN >60 03/27/2015 1625   E-GFR, NON-AFRICAN AMERICAN >60 03/27/2015 1625   UREA NITROGEN, BLOOD (BUN) 9 03/27/2015 1625   GLUCOSE 98 03/27/2015 1625   CALCIUM 9.2 03/27/2015 1625       Lab Results  Component Value Date/Time   PROTEIN  6.9 03/27/2015 1625   ALBUMIN 4.2 03/27/2015 1625   ALKALINE PHOSPHATASE (ALP) 33* 03/27/2015 1625   ASPARTATE TRANSAMINASE (AST) 17 03/27/2015 1625   BILIRUBIN TOTAL 0.3 03/27/2015 1625   ALANINE TRANSFERASE (ALT) 18 03/27/2015 1625       HgBA1C:   No results found for: HGBA1C    Coag Panel:  No results found for: APTT, PT, INR    Thyroid Panel:    Lab Results  Component Value Date/Time   THYROID STIMULATING HORMONE 1.04 03/27/2015 1625   THYROXINE, FREE (FREE T4) 0.92 03/27/2015 1625       Type and Screen:   No results found for: ABO    ROS:  CV: Denies MI. Denies any chest pain/pressure, palpitations, DOE, PND, orthopnea, dizziness or edema.  Pt. is able to lie flat without difficulty breathing.   Resp: Allergic Rhinitis, Asthma- pt denies, Took antibiotic x  10 days for URI 3 weeks ago-resolved  Denies pulmonary disease,  sleep apnea, URI or history of pneumonia  Neuro/Psych: Denies CVA, TIA, seizures, neurologic disease, or psychiatric illness  Musculoskeletal: Degenerative Cervical Disc  Med: Denies GERD, liver, kidney, or thyroid disease. Denies diabetes.  Denies blood dyscrasia/ coagulopathy. Denies recent  hospitalization or ED visit.       Activity: walk an hour everyday    PE: (per patient  report)  Airway:  Denies jaw/TMJ problems. Denies loose, missing or broken teeth.  Denies anything in her mouth that is easily removable.    Neck:  Somewhat limited-- due to neck pain    Vitals:  Temp: 37.7 C (99.9 F) (04/25/2015  2:40 PM)  Temp src: Temporal (04/25/2015  2:40 PM)  Pulse: 70 (04/25/2015  2:40 PM)  BP: 148/89 mmHg (04/25/2015  2:49 PM)  Resp: 20 (03/27/2015  3:41 PM)  SpO2: 99 % (04/25/2015  2:40 PM)  Height: 1.626 m (5\' 4" ) (04/25/2015  2:40 PM)  Weight: 61.236 kg (135 lb) (04/25/2015  2:40 PM)      LMP: June 25,2016    Pt. Instructions: .NPO instructions  per  clinic, medications(DOS:  none .Pt verbalized understanding of medication instructions and verbal read back  of medications to be taken on morning of procedure  was done for confirmation. ),anesthesia      Instructions for Day of Surgery:  -Please stop NSAID (motrin, ibuprofen, aleve, naproxen, etc) & herbal/nutritional supplement (esp. Fish oil, vitamin E , C, gingko, garlic tablets) 1 week prior to surgery to minimize the risk of bleeding or anesthetic interaction.       Please discuss with your surgeon and primary care physician/Anticoagulation clinic if you take blood thinners.  You will receive a phone call from a pre-op nurse 1-2 working days  before your surgery date to confirm your check-in and location, arrival time, and surgical time. They will also review your fasting guidelines with you.     Cherrie Charmaine Downs, RN      Pre-Op Phone Call Documentation    Verification of planned surgery times  Surgery Time Verified: Yes  Arrival Time Verified: 0915  Surgery Location Verified: [place]    Verification of NPO Status  Verified NPO Clear Liquids Time: 0715  Verified NPO Solids Time: 2359     Post surgical discharge information   Person to pick up pt: Aloha Gell (Mother)  Phone number of person picking up patient: [phone]    Nash Shearer, RN  07/15/201610:19    --------------------------------------------------    Pre-Op Phone Call  Documentation  Verification of planned surgery times  Surgery Time Verified: Yes  Arrival Time Verified: 0815  [facility] Ver[facility]    Verification of NPO Status  Verified NPO Clear Liquids Time: 0615  Verified NPO Clear Liquids Date: 05/01/15  Verified NPO Solids Time: 0000  Verified NPO Solids Date: 05/01/15     Post surgical discharge information   Person to pick up pt: Aloha Gell (Mother)  Phone number of person picking up patient: [phone]    Tonia Ghent, RN  07/18/201609:56

## 2015-04-25 NOTE — Progress Notes (Addendum)
PATIENT: Rebecca Warner LOCATION: [facility]   MR #: [id number] SEX: female  AGE: 34yr  DATE OF SERVICE: 04/25/2015 DOB: 1960/11/25    OTOLARYNGOLOGY/FACIAL PLASTIC SURGERY CLINIC NOTE    CHIEF COMPLAINT:  Preop Bleph    HISTORY OF PRESENT ILLNESS:    Rebecca Warner is a 77yr year old female with aging face. Still complaining of bags under her eyes, presenting for preop for blepharoplasty.     Rebecca Warner has not had change in smell or taste, no fevers, chills, night sweats.  No rhinorrhea.   Rebecca Warner denies odynophagia, dysphagia or otalgia.    PAST MEDICAL HISTORY.    Past Medical History   Diagnosis Date    Back pain        PAST SURGICAL HISTORY    Past Surgical History   Procedure Laterality Date    Pr cesarean delivery only       C-section, low cervical x 2       ALLERGIES    Amoxicillin and Pcn (penicillin)    MEDICATIONS    Current Outpatient Prescriptions   Medication Sig    Aspirin 325 mg Tablet Tablet     Aspirin, Buffered (BUFFERIN) 325 mg Tablet Tablet Take 650 mg by mouth two times daily if needed.    B Complex Vitamins (B-50 COMPLEX) Tablet Take  by mouth every morning.    Budesonide/Formoterol (SYMBICORT) 80-4.5 mcg/actuation HFA Aerosol Inhaler Inhaler Take 2 puffs by inhalation 2 times daily.    Cholecalciferol, Vitamin D3, (VITAMIN D-3) 2,000 unit Tablet Take  by mouth.    Cyanocobalamin (VITAMIN B-12) 500 mcg Lozenge Take  by mouth.    DIPHENHYDRAMINE HCL (BENADRYL PO)     Ketotifen Fumarate (ZADITOR) 0.025 % Ophthalmic Solution     LORATADINE PO     PROGESTERONE MISC Apply  to the affected area. Bid     No current facility-administered medications for this visit.       SOCIAL HISTORY:  Social History     Social History    Marital Status: MARRIED     Spouse Name: N/A    Number of Children: 2    Years of Education: N/A     Occupational History    Airline pilot      Social History Main Topics    Smoking status: Never Smoker     Smokeless tobacco: Never Used    Alcohol Use: Yes      Comment: 1 glass at  night    Drug Use: No    Sexual Activity: Not on file     History   Substance Use Topics    Smoking status: Never Smoker     Smokeless tobacco: Never Used    Alcohol Use: Yes      Comment: 1 glass at night       I did review patient's past medical and family/social history, no changes noted.     Physical examination:    The patient is a well-developed, well- nourished 54yr-old female in no acute distress.    Psychiatric: Alert and oriented x3. The patient has a normal affect.    Skin: The patient shows no signs of skin irritation, rash, or erythema.    HEENT exam: Normocephalic, atraumatic.     Eyes: Pupils equally round and reactive to light, extraocular movements are intact, conjunctiva and sclera are clear.     Oral cavity/oropharynx: Moderate brow ptosis with mild dermatochalasis to bilateral upper eyelids    Ears: External ears normal  Cardiovascular: Regular rate and rhythm    Pulmonary: Lungs clear to auscultation    Neurologic exam: Cranial nerves II-XII are grossly intact. Facial sensation and motion are normal. There is normal palatal motion.     Musculoskeletal: The trapezius and sternocleidomastoid muscles are equal bilaterally and the tongue is midline.     The neck is without lymphadenopathy.     Assessment:: Rebecca Warner is a 82yr year old female with aging face    1. Dermatochalasis of both eyelids    2. Brow ptosis    3. Facial aging         RECOMMENDATIONS:  Counselling and discussion with patient included   1)probable  Diagnosis,  2) probable  treatment options, and    3) recommendations for management with the following:    Preop consent for upcoming blepharoplasty obtained today. Again, reviewed risks, benefits, and alternatives of surgery.     We reviewed the risks benefits and alternatives including but not limited to bleeding, infection, need for further surgery, scarring, numbness, hematoma, vision change or loss, dry eyes, poor cosmetic outcome as well as the  alternatives and benefits, the patient expressed understanding and wishes to proceed.      Future Appointments  Date Time Provider [place]   05/04/2015 2:00 PM NURSE [facility] [place]   05/07/2015 10:45 AM Juanito Doom, MD [facility] ENT [facility]     Barriers to Learning assessed: none. Patient verbalizes understanding of teaching and instructions.    SCRIBE DISCLAIMER:  This note was scribed by Joline Maxcy, a trained medical scribe, in the presence of Dr. Hinda Kehr.    PROVIDER DISCLAIMER:  This document serves as my personal record of services taken in my presence. It was created on 04/25/2015 on my behalf by Joline Maxcy, a trained medical scribe.    I have reviewed this document and agree that this note accurately reflects the history and exam findings, the patient care provided, and my medical decision making.     Electronical Signed :      Darnelle Maffucci T. Hinda Kehr, MD MPH F.A.C.S.  Professor   Facial Plastic and Reconstructive Surgery  Department of Otolaryngology-Head and Neck Surgery   PI#: [phone]  Pager: [phone]

## 2015-04-25 NOTE — Patient Instructions (Signed)
"Together we pledge compassionate care with exceptional service."    PREOPERATIVE INSTRUCTIONS AND INFORMATION     If there are any changes in your health or questions concerning your admission or surgery, please call      Doctor: Tolle[place]son At: [facility] Phone: [phone]     Date of Surgery: 05/01/2015   Post-op Appointment:     Post- op Appointment Time:        INSTRUCTIONS FOR DAY OF SURGERY/PROCEDURE:  1. Arrival time is 2 hours prior to the scheduled start time, which could be as early as 5:30 a.m.   2. Please follow the Fasting (NPO) Guidelines (see below)  3. We care about your safety.  Please make arrangements to have a responsible adult with you when you leave the hospital. You must also arrange for car transportation (walking, bicycles, taxi and buses are prohibited). Your reflexes and judgment may be impaired for 18-48 hours after your surgery; therefore we cannot allow you to take responsibility for transporting yourself.  You should not drive, use power tools, make important decisions or drink alcohol for at least 24 hours after your operation. You must have someone available to stay with you overnight.   UNFORTUNATELY, YOUR   SURGERY/ PROCEDURE WILL BE CANCELLED IF YOU DO NOT MAKE THESE ARRANGMENTS.   4. DO NOT smoke.  Smoking damages the protective mechanisms in the lung, causes build up of stomach juices, and heightens dangerous airway reflexes.  Stop smoking as far in advance as possible.  Smoking is not allowed on hospital grounds.  5. DO NOT drink alcohol or use illicit drugs 24 hours prior to your surgery.  Inform your surgeon if you use any of these substances.  6. Please take a bath or shower the morning of your procedure, as this is important to reduce skin bacteria.  7. DO NOT apply cosmetics, creams or lotions, powders, gels or perfumes after bathing.  8. DO NOT bring valuables (computers, cell phones, etc.) or jewelry (including your wedding ring) with you on the day of surgery.   Please REMOVE ALL PIERCING JEWELRY PRIOR TO YOUR ARRIVAL.  9. It is best to wear comfortable, loose fitting clothing.  10. Bring some form of picture ID with you.    ADDITIONAL INSTRUCTIONS:  1. Please bring your CPAP machine with you if you use one.  2. Please follow the bowel prep instructions if this procedure is ordered by your surgeon.  UNFORTUNATELY, YOUR SURGERY WILL BE CANCELLED IF THIS IS NOT DONE.      FASTING (NPO) Guidelines - NOTHING BY MOUTH OR TUBE FEEDING (ENTERAL FEEDING)    FOR ADULTS:   NO solid food, dairy products, jello, broths, chewing gum, hard candy, lozenges or mints 8 hours prior to arrival     On the morning of Surgery/Procedure:  You may have sips of clear liquids up to 2 hours prior to arrival  o  Clear liquids include: water, clear fruit juice (no pulp), carbonated beverages and black coffee or tea without cream or milk, no sugar or sweeteners      No alcohol containing beverages        MEDICATIONS     On the morning of your surgery, DO NOT take the following medications:     PT TO TAKE MEDICATION INSTRUCTED BY MD        Please stop NSAID (motrin, ibuprofen, aleve, naproxen, etc) & herbal/nutritional supplement (esp. Fish oil, vitamin E , C, gingko, garlic tablets) 7 days  prior to surgery to minimize the risk of bleeding or anesthetic interaction.     Please stop Aspirin 7 days prior to surgery. EXCEPTION: For cardiac patients with coronary/vascular stents or strokes, please discuss with your surgeon and cardiologist/neurologist prior to stopping your antiplatelet therapy (Aspirin, Plavix/Clopidogrel, Effient/Prasugrel, etc)     Please discuss with your surgeon and primary care physician/Anticoagulation clinic if you take blood thinners (Anticoagulant - Warfarin/Coumadin, Dabigatran/Pradaxa; Apixaban/Eliquis;Rivaroxaban/Xarelto ,etc) or if you had taken blood thinning injections (Enoxaparin/Lovenox) prior to surgeries in the past. The surgery may be cancelled or postponed if you are  still taking these type of medications near to or on the day of surgery.         YOUR SURGERY AND CONTACT INFORMATION      [facility], [address], [place], [place] [address]   The final surgery schedule is available 24 hours prior to your scheduled surgery date.  The [facility] will contact you the day before your surgery between the hours of 12 noon and 6 p.m. (Monday to Friday) with instructions regarding your arrival time, where to check in and additional information you will need for your surgery.  You may call the [facility] Surgery line at [phone], the day before your surgery if you have not received information.    If you reach our voice mail, please leave your name, your medical record number, your surgery date and a telephone number where we may reach you, and we will return your call.  IF YOU MUST CANCEL YOUR SURGERY OUTSIDE NORMAL BUSINESS HOURS ON THE DAY BEFORE OR THE DAY OF THE SCHEDULED SURGERY, PLEASE CALL THE [place] [facility] SURGERY LINE AS SOON AS POSSIBLE AT [phone].    [place].  IT IS OUR PRIVILEGE TO CARE FOR YOU!

## 2015-04-27 NOTE — Nurse Focus (Signed)
Adult preoperative call included the following information and instruction:  Pt. identity confirmed (name, DOB and address).  Medication(s) to take on DOS -  Loratidine if needed.  ASA/NSAID/anti-platelet/anti-coagulant instructions/discontinuation.  Screening for rash, cold/ flu, recent pneumonia or open wounds.  Instructed to shower prior to arrival or as instructed by Clinic.  Instructed to bring insurance card (s) and a photo ID, CPAP/BiPAP machine.  Instructed to leave valuables at home.  Visitation instructions for pre/post-op areas and waiting room(s)   Directions to [facility] and Admission Office/ [place]  Telephone number [phone] provided for confirmation of information, and questions and concerns.

## 2015-05-01 ENCOUNTER — Ambulatory Visit
Admission: RE | Admit: 2015-05-01 | Discharge: 2015-05-01 | DRG: 125 | Disposition: A | Discharge status: Home / Self Care | Payer: Self-pay | Source: Ambulatory Visit | Attending: Otolaryngology | Admitting: Otolaryngology

## 2015-05-01 ENCOUNTER — Ambulatory Visit: Payer: Self-pay | Admitting: Anesthesiology

## 2015-05-01 ENCOUNTER — Encounter
Admission: RE | Disposition: A | Discharge status: Home / Self Care | Payer: Self-pay | Source: Ambulatory Visit | Attending: Otolaryngology

## 2015-05-01 ENCOUNTER — Ambulatory Visit (HOSPITAL_BASED_OUTPATIENT_CLINIC_OR_DEPARTMENT_OTHER): Payer: Self-pay | Admitting: Anesthesiology

## 2015-05-01 DIAGNOSIS — J45909 Unspecified asthma, uncomplicated: Secondary | ICD-10-CM | POA: Insufficient documentation

## 2015-05-01 DIAGNOSIS — Z411 Encounter for cosmetic surgery: Secondary | ICD-10-CM

## 2015-05-01 DIAGNOSIS — H02833 Dermatochalasis of right eye, unspecified eyelid: Secondary | ICD-10-CM

## 2015-05-01 DIAGNOSIS — H02835 Dermatochalasis of left lower eyelid: Secondary | ICD-10-CM | POA: Insufficient documentation

## 2015-05-01 DIAGNOSIS — H02832 Dermatochalasis of right lower eyelid: Secondary | ICD-10-CM | POA: Insufficient documentation

## 2015-05-01 DIAGNOSIS — H02831 Dermatochalasis of right upper eyelid: Principal | ICD-10-CM | POA: Insufficient documentation

## 2015-05-01 DIAGNOSIS — H02834 Dermatochalasis of left upper eyelid: Secondary | ICD-10-CM | POA: Insufficient documentation

## 2015-05-01 DIAGNOSIS — H02836 Dermatochalasis of left eye, unspecified eyelid: Secondary | ICD-10-CM

## 2015-05-01 HISTORY — PX: BLEPHAROPLASTY: SHX000152

## 2015-05-01 SURGERY — BLEPHAROPLASTY
Anesthesia: Monitored Anesthesia Care | Site: Eye | Laterality: Bilateral | Wound class: Clean

## 2015-05-01 MED ORDER — INTRAOP STERILE WATER 1000 ML IRRIGATION BOTTLE
Status: DC | PRN
  Administered 2015-05-01: 1000 mL

## 2015-05-01 MED ORDER — HYDROMORPHONE 1 MG/ML INJECTION SYRINGE
0.2000 mg | INJECTION | INTRAMUSCULAR | Status: DC | PRN
  Administered 2015-05-01: 0.4 mg via INTRAVENOUS
  Filled 2015-05-01: qty 1

## 2015-05-01 MED ORDER — BACITRACIN 500 UNIT/GRAM EYE OINTMENT
TOPICAL_OINTMENT | OPHTHALMIC | Status: DC | PRN
  Administered 2015-05-01: 3 [in_us] via OPHTHALMIC

## 2015-05-01 MED ORDER — LIDOCAINE HCL 10 MG/ML (1 %) INJECTION SOLUTION
Status: DC | PRN
  Administered 2015-05-01: 4.5 mL

## 2015-05-01 MED ORDER — ONDANSETRON HCL (PF) 4 MG/2 ML INJECTION SOLUTION: 4.0000 mg | INTRAMUSCULAR | Status: DC | PRN

## 2015-05-01 MED ORDER — INTRAOP SODIUM BICARBONATE 8.4% INJ 10 ML SYRINGE: Status: DC | PRN

## 2015-05-01 MED ORDER — EPHEDRINE SULFATE 50 MG/ML INJECTION SOLUTION
INTRAMUSCULAR | Status: DC | PRN
  Administered 2015-05-01: 5 mg via INTRAVENOUS

## 2015-05-01 MED ORDER — LACTATED RINGERS IV INFUSION: INTRAVENOUS | Status: DC

## 2015-05-01 MED ORDER — CEFAZOLIN 1 GRAM SOLUTION FOR INJECTION
INTRAMUSCULAR | Status: DC | PRN
  Administered 2015-05-01: 2000 mg via INTRAVENOUS

## 2015-05-01 MED ORDER — METOCLOPRAMIDE 5 MG/ML INJECTION SOLUTION: 10.0000 mg | INTRAMUSCULAR | Status: DC | PRN

## 2015-05-01 MED ORDER — INTRAOP LIDOCAINE PF 2% INJ 5 ML VIAL: Status: DC | PRN

## 2015-05-01 MED ORDER — LIDOCAINE HCL 10 MG/ML (1 %) INJECTION SOLUTION
0.1000 mL | INTRAMUSCULAR | Status: DC | PRN
  Administered 2015-05-01: 0.1 mL

## 2015-05-01 MED ORDER — LACTATED RINGERS IV INFUSION
INTRAVENOUS | Status: DC
  Administered 2015-05-01: 10:00:00 via INTRAVENOUS

## 2015-05-01 MED ORDER — INTRAOP DEXMEDETOMIDINE 100 MCG/ML IV BOLUS DOSE
Status: DC | PRN
  Administered 2015-05-01: 20 ug via INTRAVENOUS
  Administered 2015-05-01: 16 ug via INTRAVENOUS
  Administered 2015-05-01 (×2): 12 ug via INTRAVENOUS

## 2015-05-01 MED ORDER — LIDOCAINE 1 %-EPINEPHRINE 1:100,000 INJECTION SOLUTION
INTRAMUSCULAR | Status: DC | PRN
  Administered 2015-05-01: 4 mL

## 2015-05-01 MED ORDER — INTRAOP BUPIVACAINE 0.25%-EPI 1:200,000 PF INJ 10 ML VIAL
Status: DC | PRN
  Administered 2015-05-01: 2 mL

## 2015-05-01 MED ORDER — MIDAZOLAM (PF) 1 MG/ML INJECTION SOLUTION
INTRAMUSCULAR | Status: DC | PRN
  Administered 2015-05-01 (×4): 2 mg via INTRAVENOUS

## 2015-05-01 MED ORDER — CLINDAMYCIN 600 MG/50 ML IN 5 % DEXTROSE INTRAVENOUS PIGGYBACK: 600.0000 mg | INJECTION | INTRAVENOUS | Status: DC

## 2015-05-01 MED ORDER — LACTATED RINGERS IV INFUSION
INTRAVENOUS | Status: DC | PRN
  Administered 2015-05-01: 10:00:00 via INTRAVENOUS

## 2015-05-01 MED ORDER — PHENYLEPHRINE (PF) 1 MG/10 ML (100 MCG/ML) IN 0.9 % NACL IV SYRINGE
INJECTION | INTRAVENOUS | Status: DC | PRN
  Administered 2015-05-01 (×3): 100 ug via INTRAVENOUS

## 2015-05-01 MED ORDER — FENTANYL (PF) 50 MCG/ML INJECTION SOLUTION
INTRAMUSCULAR | Status: DC | PRN
  Administered 2015-05-01 (×3): 25 ug via INTRAVENOUS
  Administered 2015-05-01: 50 ug via INTRAVENOUS
  Administered 2015-05-01: 25 ug via INTRAVENOUS
  Administered 2015-05-01 (×2): 50 ug via INTRAVENOUS

## 2015-05-01 MED ORDER — INTRAOP NACL 0.9% 1000 ML IRRIGATION BOTTLE
Status: DC | PRN
  Administered 2015-05-01: 1000 mL

## 2015-05-01 MED ORDER — FENTANYL (PF) 50 MCG/ML INJECTION SOLUTION: 25.0000 ug | INTRAMUSCULAR | Status: DC | PRN

## 2015-05-01 SURGICAL SUPPLY — 26 items
APPLICATOR Q TIPS 3IN STERILE (Other) ×12 IMPLANT
CAUTERY BOVIE PAD ADULT (Other) ×2 IMPLANT
CONTAINER MEDICINE CUP 2OZ STERILE (Other) ×2 IMPLANT
COVER LIGHT HANDLE STERIS (Other) ×6 IMPLANT
DISC OBS 160191 - PLAIN GUT FAST ABSORB 6-0 PC-1 18IN 1916G (Suture) ×4 IMPLANT
DRAPE SHEET LARGE 60 X 76 (89121) (Drape) ×2 IMPLANT
DRAPE SHEET U 76 X 120IN (Drape) ×2 IMPLANT
DRAPE TOWEL CLOTH 17 X 27IN STERILE BLUE 4 PACK (Drape) IMPLANT
DRAPE XRAY CASSETTE COVER WITH LINER (Drape) ×2 IMPLANT
DRESSING OVAL EYE PAD 2 1/8 X 2 5/8IN (Dressing) ×4 IMPLANT
DRESSING SPONGE NON WOVEN 2 x 2IN STERILE (Dressing) ×4 IMPLANT
ELECTRODE NEEDLE INSULATED 2.8IN (Cautery) ×2 IMPLANT
GLOVES BIOGEL 8 1/2 TOP GLOVE LATEX (Glove) ×2 IMPLANT
GOWN ULTRA X-LARGE (95121) (Gown) ×2 IMPLANT
HEADREST DONUT 9IN (M10-090) (Other) ×2 IMPLANT
INSTRUMENT FRAZIER SUCTION TIP 8FR (Other) ×2 IMPLANT
NEEDLE 27GA SAFETY 1 1/2IN (305136, 8881200508) (Needle) ×4 IMPLANT
PACK BASIN LATEX SAFE (DYNJ0191310Q) (Pack) ×2 IMPLANT
PACK GRADUATE STERILE (DYNJ0007640) (Other) ×2 IMPLANT
PAD EGGCRATE HEEL & ANKLE LF (4815) (Other) ×2 IMPLANT
SPONGE X-RAY STR 4X4IN GAUZE 441002 (Sponge) ×6 IMPLANT
SUTURE ETHILON 6-0 P-3 18IN BLACK (Suture) ×4 IMPLANT
SUTURE PDS 5-0 P-3 18IN CLEAR (Suture) ×4 IMPLANT
SUTURE SILK 6-0 P-1 18IN BLACK (Suture) ×2 IMPLANT
SYRINGE SAFETY LL 12ML (8881522000) (Syringe) ×2 IMPLANT
TRAY SKIN SCRUB PVP W/SPONGE (41591) (Prep) ×2 IMPLANT

## 2015-05-01 NOTE — OR Nursing (Signed)
OR Arrive    Patient's level of consciousness: awake and comfortable    Summary report reviewed: yes    Patient transported to OR via: gurney  Transported by: anesthesia person  Head of bed: elevated  Oxygen delivered via: N/A  Monitored enroute: no  Assumed care.

## 2015-05-01 NOTE — Anesthesia Preprocedure Evaluation (Addendum)
Anesthesia Evaluation    History of Present Illness  33 F with allergic rhinitis, asthma, rosacea, degenerative cervical disc and radiculopathy, here for b/l upper and lower blepharoplasty.  Airway   Mallampati: II  TM distance: >3 FB     Neck ROM is full. Dental - no notable dental history.   (+) chipped (chipped left upper tooth)   Pulmonary - normal exam  (+) asthma,     Pulmonary ROS comment: Well controlled, not on inhalers  Patient's breath sounds clear to auscultation. Cardiovascular - cardiovascular exam normal  (-) hypertension, DOE    Rhythm: regular  Rate: normal  Patient has good exercise tolerance. [facility] ~1.5hr daily)   Neuro/Psych - negative for neuro conditions with ROS     Neuro/Psych ROS comment: Degenerative cervical disc and radiculopathy   GI/Hepatic/Renal - negative for GI hepatic or renal condions with ROS  GI exam normal  (-) GERD     Endo/Other    (-) diabetes mellitus    Comments: Allergic rhinitis  Rosacea,                      Anesthesia Plan    ASA 2     MAC   (Deep MAC)  intravenous induction   Anesthetic plan and risks discussed with patient.  Resident/Fellow/CRNA discussed the plan with the attending.  I personally performed a physical assessment on this patient.

## 2015-05-01 NOTE — Anesthesia Postprocedure Evaluation (Signed)
Patient: Rebecca Warner    BLEPHAROPLASTY    Anesthesia Type: MAC    Vital Signs (Last Recorded):  BP: 126/89 mmHg  Pulse: 69  Resp: 18  Temp Max: 37.3 C (99.1 F)  (Last 24 hours)  Temp: 36.3 C (97.3 F)  SpO2: 98 % on    O2 Device: room air      Anesthesia Post Evaluation    Procedure: Procedure(s):BLEPHAROPLASTY  [facility]  Anesthesia: Anesthesia type not filed in the log.    Patient location during evaluation: PACU  Patient participation: complete - patient participated  Level of consciousness: awake and alert  Pain management: adequate  Airway patency: patent  Anesthetic complications: no  Cardiovascular status: blood pressure returned to baseline and stable  Respiratory status: room air  Hydration status: euvolemic     Breck Coons, MD        Dispo: Pt received dilaudid 0.4mg , with good pain control. No nausea. VSS. O2 sat >95% on RA. Pt resting comfortably in bed, responding appropriately. Meets discharge criteria. Stable for discharge home.    Electronically signed by:  Natividad Brood M.D. PGY-4  [facility]-Department of Anesthesiology and Pain Medicine  PI [phone]  Pager # [phone]

## 2015-05-01 NOTE — Procedures (Addendum)
RESIDENT BRIEF OPERATIVE NOTE  Service: OTO HNS    Date of Service: 05/01/2015     Pre-Op Diagnosis:  Dermatochalasis of upper and lower eyelids    Post-Op Diagnosis:  Same    Procedure Performed/Description:    Bilateral upper blepharoplasties  Bilateral lower blepharoplasties    Name of Surgeon and Assistants:  Shreya Lacasse, Bai    Type of Anesthesia:  MAC    DVT Prophylaxis:  SCDs    Findings:  As expected. Conservative skin and muscle excision on upper eyelids. Conservative skin, muscle, and fat excision on lower eyelids.    Specimens Removed:  none    EBL:  53ml    Drains:  none    Fluids:  See Anesthesia Record    Complications:  None    Outcome: Good    Electronically signed by:  Ike Bene, MD, PGY-5  Otolaryngology  PI [phone]  Service Pager [phone]  Personal pager [phone]    ATTENDING BRIEF OPERATIVE NOTE    Operating Room Procedure Presence:  I was physically present for the entire procedure.    The information contained on this form is true and accurate to the best of my knowledge.  Further, I understand that if I misrepresent, falsify or conceal information regarding my participation in the professional service described above, I may be subject to fine, imprisonment, or civil penalty under applicable federal laws.    Electronically signed by:     Attending:    Serena Colonel. Hinda Kehr, M.D., MPH,  F.A.C.S.  Associate Professor   Facial Plastic and Reconstructive Surgery  Department of Otolaryngology-Head & Neck Surgery  PI#: [phone]  Pager: [phone]

## 2015-05-01 NOTE — Discharge Instructions (Signed)
DISCHARGE INSTRUCTIONS:    You had the following procedure(s): upper and lower lid blepharoplasties    Wound Care:  - Do not rub your eyes  - if your eyes feel dry, use artificial tears as needed  - apply ophthalmic bacitracin to the incisions twice daily  - when washing your face, be careful not to rub the eye area.    Activity:  - Do not lift more than 15 lbs for 1 week  - You may otherwise walk and exercise without restriction.    Diet:  - You may resume a regular diet.    Medications:  Resume your home medications except aspirin. You may restart aspirin in 5 days.    Current Discharge Medication List      CONTINUE these medications which have NOT CHANGED    Details   Aspirin 325 mg Tablet Tablet       Aspirin, Buffered (BUFFERIN) 325 mg Tablet Tablet Take 650 mg by mouth two times daily if needed.      B Complex Vitamins (B-50 COMPLEX) Tablet Take  by mouth every morning.  Qty: 30 tablet      Cholecalciferol, Vitamin D3, (VITAMIN D-3) 2,000 unit Tablet Take  by mouth.      Cyanocobalamin (VITAMIN B-12) 500 mcg Lozenge Take  by mouth.      DIPHENHYDRAMINE HCL (BENADRYL PO)       Ketotifen Fumarate (ZADITOR) 0.025 % Ophthalmic Solution       LORATADINE PO       PROGESTERONE MISC Apply  to the affected area. Bid         STOP taking these medications       Budesonide/Formoterol (SYMBICORT) 80-4.5 mcg/actuation HFA Aerosol Inhaler Inhaler Comments:   Reason for Stopping:               - Pain medication: take over-the-counter acetaminophen 650mg  every 6 hours as needed for pain. Do not exceed 4000mg  in 24 hours.    Future Appointments  Date Time Provider [place]   05/04/2015 10:30 AM NURSE [facility] ENTPLA ENT [facility]   05/07/2015 10:45 AM Juanito Doom, MD [facility] ENT [facility]        Please call the Otolaryngology[facility] at [phone] if you need to reschedule your appointment.    Signs to look out for:  * Temp greater than 100.5 or persistent chills  * Redness, swelling or drainage from incision  *  Worsening pain, not relieved by pain medication  * Pus from wound sites  * Any other concerning symptoms or signs: vision loss or burning or severe pain in your eyes.    For non-urgent medical problem, please call the Otolaryngology[facility] clinic at [phone] during the daytime on weekdays. Call the [facility] Operator at [phone] and ask for the ENT physician on call during the night time or weekend. For emergent issues, please proceed immediately to the ER.

## 2015-05-01 NOTE — Nurse Discharge Note (Signed)
Discharge to home via wheelchair to private auto at 1343.   Belongings not applicable.  See EMR for assessment.  Lavina Resor Laurey Morale, RN

## 2015-05-01 NOTE — Nurse Focus (Signed)
PREOP ADMIT NURSING NOTE    Note Started: 05/01/2015, 09:11     Received patient from as a direct admit at 0905 hours via ambulation.  Patient status  awake and alert.  Oriented to room and unit.  Admission assessment and care plan initiated.   PIN number cards provided to patient.   Loralee Pacas, RN

## 2015-05-01 NOTE — Plan of Care (Signed)
Problem: Perioperative Period (Adult)  Intervention: Promote Pulmonary Hygiene and Secretion Clearance  Pain level is tolerable, no distress    Goal: Signs and Symptoms of Listed Potential Problems Will be Absent or Manageable (Perioperative Period)  Signs and symptoms of listed potential problems will be absent or manageable by discharge/transition of care (reference Perioperative Period (Adult) CPG).   Outcome: Ongoing (interventions implemented as appropriate)

## 2015-05-01 NOTE — Progress Notes (Addendum)
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  There have been no interval changes.    Informed consent confirmed in chart.  The risks, benefits, and alternatives of the procedure have been discussed.  The patient understands and agrees to proceed with the operation.  All questions and concerns were addressed.    Electronically signed by:  Ike Bene, MD, PGY-5  Otolaryngology - Head and Neck Surgery  [place]  Service Pager [phone]  Personal pager [phone]    The patient was seen and examined with the resident.  I agree with the documented history and physical examination.  We formulated the plan together.    We reviewed the risks benefits and alternatives includeing but not limited to bleeding, infection, need for further surgery, scarring, numbness, hematoma, eyelid malposition, vision change vision ,vision loss poor cosmetic outcome as well as the alternatives and benefits, the patient expressed understanding and wishes to proceed.      Electronically signed by:    Serena Colonel. Hinda Kehr, M.D., M.P.H., F.A.C.S.  Professor   Facial Plastic and Reconstructive Surgery  Department of Otolaryngology   PI#: [phone]  Pager: [phone]

## 2015-05-01 NOTE — OR Nursing (Signed)
OR To Postop Destination    Patient's level of consciousness: awake and comfortable    Patient transferred to: PACU  Transported via: gurney  Transported by: anesthesia person and circulator  Head of bed: elevated  Oxygen delivered via: N/A  Monitored enroute: no    Report given to: PACU RN

## 2015-05-01 NOTE — Nurse Focus (Signed)
PACU ADMIT NURSING NOTE    Note Started: 05/01/2015  1215     Received patient from OR at 1215 hours via gurney.  Monitor and Alarms on.  Patient alert and oriented x4 . Chad Cordial, RN

## 2015-05-01 NOTE — Procedures (Signed)
PATIENT:  Rebecca Warner, POLHEMUS  MR #:  [id number]  DOB:  [birth year]  SEX:  F  AGE:  54  OPERATION DATE:  05/01/2015  INPATIENT OPERATION RECORD    PREOP DIAGNOSIS:    Dermatochalasis of upper and lower eyelids.    POSTOP DIAGNOSIS:    Same.    PROCEDURE PERFORMED:    Bilateral upper blepharoplasties, bilateral lower blepharoplasties.    TYPE OF ANESTHESIA:    MAC.    DVT PROPHYLAXIS:    SCDs.    FINDINGS:    Conservative skin excision and muscle excision in the upper eyelids.  Conservative skin muscle and fat excision on the lower eyelids.    SPECIMENS REMOVED:    None.    EBL:    1 cc.    DRAINS:    None.    FLUID:    See Anesthesia.    COMPLICATIONS:    None.    OUTCOME:    Good.    BRIEF HPI/INDICATIONS FOR PROCEDURE:    Aniaya Bacha is a 54 year old female with aging face and bilateral  upper and lower eyelid dermatochalasis.  She expressed understanding  of the risks of procedure, which include but are not limited to pain,  bleeding, infection, blindness, corneal abrasion, poor cosmesis, scar,  ectropion, entropion, possible need for further surgery.  She wished  to proceed.    DESCRIPTION OF PROCEDURE IN DETAIL:    The patient was identified in the preoperative holding area and taken  back to the Operating Room.  She was turned to the anesthetist for  induction of conscious sedation.  Her head was positioned 180 degrees  away from the anesthetist.  The upper eyelids were marked and measured  from the upper tarsal crease to approximately 5 mm superior to that.  The skin was pinched to ensure no lagophthalmos.  The incisions were  injected with a mixture of 1% lidocaine with 1:100,000 epinephrine  with bicarbonate.  0.25% Marcaine with 1:200,000 was also injected;  this was done with both eyelids.  The patient was prepped and draped  in a sterile fashion.    First on the right eyelid, the 15-blade was used to make the skin  incisions along the upper tarsal crease in a wing shape encompassing  an area skin 5 mm superior to  the upper tarsal crease.  Skin was  peeled off of the muscular layer with monopolar cautery. Monopolar  cautery was used to control any bleeding.  A strip of preseptal  orbicularis oculi was resected with scissors.  Hemostasis was then  assured.  Then the skin and muscle are closed in a simple layer with  interrupted 6-0 nylon.  This was alternated with interrupted 6-0 fast-  absorbing gut.    Then the left upper eyelid was addressed in the same fashion by making  a skin incision with a 15-blade in a wing shape, going from the upper  tarsal crease to approximately 5 mm superior to that.  The skin was  peeled off of the orbicularis muscle with monopolar cautery and  discarded.  A slim strip of preseptal orbicularis oculi muscle was  then resected with scissors and discarded.  Hemostasis was assured  with monopolar cautery.  The skin and muscle were reapproximated with  interrupted 6-0 nylon alternating with 6-0 fast-absorbing gut.    Then the lower lids were injected with 1% lidocaine with 1:100,000  epinephrine.  The subciliary incisions were marked, and then the right  subciliary incision  was made with a 15-blade, and laterally this was  brought out to an obtuse angle.  Scissors were used to dissect down  through the orbicularis muscle to the preseptal plane.  The preseptal  plane was developed, and the lower lid was peeled in this preseptal  plane inferiorly.  Then the orbital septum was dissected to reveal the  medial and central fat pads.  The lateral fat pad was shrunk with  cautery.  Then the middle fat pad was grasped and carefully dissected  out.  A small amount of fat was then truncated at its base with  bipolar cautery and scissors, and the medial fat pad was carefully  dissected out, and a small amount of the fat pad was resected with  bipolar cautery and scissors.  Hemostasis was assured.    The lower lid skin was redraped over the lid margin and the excess  skin was excised with scissors.  The lateral  canthal part of the  incision was closed with 6-0 silk in interrupted fashion.  Four  stitches were used.  Then the remainder of the subciliary incision was  closed with 6-0 fast in interrupted fashion.  An ice pack was placed.  Then the left lower eyelid was addressed.  Left subciliary incision  was made with a 15-blade and laterally out to an obtuse angle.  The  scissors were used to develop the preseptal plane.  The preseptal  plane was then dissected and then the lower lid skin was peeled off in  this preseptal plane.    The orbital septum was dissected to reveal the fat pads.  The lateral  fat pad was cauterized to shrink it down.  The middle fat pad was  grasped and dissected out, and a small amount was removed with bipolar  cautery and scissors, and the nasal fat pad was dissected out and a  small amount of it was excised with bipolar cautery and scissors.    The skin of the lower lid was redraped over the lid margin, and the  excess was removed with scissors.  The skin and muscle were then  reapproximated laterally with 6-0 silk.  Three stitches were used.  Then 6-0 fast was used in an interrupted fashion to close the skin.  Ice pack was placed.  The skin was cleansed.  Ophthalmic Bacitracin  was applied to the incisions.    The patient was then awakened and taken back to the PACU in stable  condition.    NAME OF SURGEONS AND ASSISTANTS:    Surgeon:  Juanito Doom, MD, MPH, FACS  Assistant Surgeon:  Derry Lory, MD        Report Electronically Signed - 05/01/2015 21:52:43 by  Derry Lory, MD  Resident  Otolaryngology    Report Electronically Signed - 05/08/2015 23:39:37 by  Juanito Doom, MD, MPH, FACS  Associate Professor    Otolaryngology    HHB/cg  D:  05/01/2015 12:19:25 PDT/PST  T:  05/01/2015 15:12:30 PDT/PST  Job #:  [id number] / [id number]

## 2015-05-03 ENCOUNTER — Telehealth: Payer: Self-pay | Admitting: Otolaryngology

## 2015-05-03 NOTE — Telephone Encounter (Signed)
Verified X3    Patient called in and stated she had surgery on both eyes on 05/01/2015 and she is having some issues. Patient stated her left eye ball is really red and is swollen, she stated it burns. Her right eye is doing just fine, patient is having concerns and wants to know what she needs to do.     Rebecca Warner  MOSC II

## 2015-05-03 NOTE — Telephone Encounter (Addendum)
FYI:  Reviewed chart notes.  Returned patient's call with identification verified x 3.  Patient is POD# 3 Bilateral Upper & Lower Blepharoplasties.  Patient reports Left lower eye  conjunctiva area redness and edema.  Patient also reports itching post Bacitracin use, more on Left than Right.  Patient is concerned.  Patient denies fever, drainage, uncontrolled pain, or bleeding. Informed patient will confer with Provider/Nursing staff and return call with information.  Patient is receptive and voiced understanding  Emmit Alexanders, RN    Addendum:  Conferred with LVN Somerr who request picture to be sent.  Return call and provider number to sent picture of eye.  Understanding voiced.  Emmit Alexanders, RN    Addendum:  Conferred with LVN Somerr who informed patient to hold Bacitracin and forwarded picture to the Provider for input/direction.  Emmit Alexanders, RN

## 2015-05-03 NOTE — Telephone Encounter (Signed)
PAtient reviewed with Somerr and I agree with the plan as established.   We will see patient tomorrow am for routine followup   Appreciate nursing assistance and follow through     Electronically signed by:    Darnelle Maffucci T. Hinda Kehr, M.D., MPH,  F.A.C.S.  Professor   Facial Plastic and Reconstructive Surgery  Department of Otolaryngology-Head and Neck Surgery  PI#: [phone]  Pager: [phone]

## 2015-05-04 ENCOUNTER — Ambulatory Visit: Payer: Self-pay | Attending: Otolaryngology | Admitting: Otolaryngology

## 2015-05-04 DIAGNOSIS — H02833 Dermatochalasis of right eye, unspecified eyelid: Secondary | ICD-10-CM

## 2015-05-04 DIAGNOSIS — Z9889 Other specified postprocedural states: Secondary | ICD-10-CM

## 2015-05-04 DIAGNOSIS — Z85828 Personal history of other malignant neoplasm of skin: Secondary | ICD-10-CM | POA: Insufficient documentation

## 2015-05-04 DIAGNOSIS — R238 Other skin changes: Secondary | ICD-10-CM | POA: Insufficient documentation

## 2015-05-04 DIAGNOSIS — H02836 Dermatochalasis of left eye, unspecified eyelid: Secondary | ICD-10-CM

## 2015-05-04 DIAGNOSIS — H57819 Brow ptosis, unspecified: Secondary | ICD-10-CM

## 2015-05-04 DIAGNOSIS — H1012 Acute atopic conjunctivitis, left eye: Secondary | ICD-10-CM

## 2015-05-04 MED ORDER — DEXAMETHASONE SODIUM PHOSPHATE 0.1 % EYE DROPS: 2.0000 [drp] | Freq: Two times a day (BID) | OPHTHALMIC | Status: AC

## 2015-05-04 NOTE — Progress Notes (Signed)
Patient identification verified x 2. Allergies checked. Pain level and barriers assessed. Medication reconciled.[name], [name] LVN

## 2015-05-04 NOTE — Progress Notes (Addendum)
05/04/2015  Post op Mohs reconstruction   HPI:  Rebecca Warner is a 54yr old female presenting 3 days status post bilateral upper and lower blepharoplasty. She complains of swelling, irritation, and pain to left eye.     PHYSICAL EXAMINATION:    Appropriate edema and ecchymoses were present after reconstruction.     Eye exam: Left upper and lower eyelid diffusely tender. Chemosis to left upper eyelid. No evidence of irritant. Pupils equal round and intact. Extraocular motion intact but Left lower eyelid pain with eye movement.     20/20 left eye and 20/25 right eye with correction with snelling chart  .Marland Kitchen EOM intact bilaterally. subconjunctival hemorrhage noted on the left       IMPRESSION: Rebecca Warner  is 1 month status bilateral upper and lower blephs with left greater than right chemosis and irritation of the lateral sclera.      1. Dermatochalasis of both eyelids    2. Brow ptosis    3. Facial aging    4. Acute atopic conjunctivitis, left         PLAN:      Orders Placed This Encounter    dexamethasone sodium phosphate 0.1 % Drops      Recommended Refresh, steroid eyedrop for left side eye inflammation. Instructed patient to refrigerate eyedrop before use. Apply ice to left eye for swelling.     Did not see obvious evidence of sutures irritating left eye; discussed possibility corneal or scleral abrasion. Present to ER if pain and swelling to left eye worsens    I have asked the patient to continue wound care until follow up with me.    Future Appointments  Date Time Provider [place]   05/04/2015 10:30 AM NURSE [facility] ENT[facility] ENT [facility]   05/07/2015 10:45 AM Juanito Doom, MD [facility] ENT [place]       SCRIBE DISCLAIMER:  This note was scribed by Joline Maxcy, a trained medical scribe, in the presence of Dr. Hinda Kehr.    PROVIDER DISCLAIMER:  This document serves as my personal record of services that I personally performed and was taken in my presence. It was created on 05/04/2015 on my behalf by  Joline Maxcy, a trained medical scribe.      I have reviewed this document and agree that this note accurately and completely reflects the history and exam findings, the patient care provided, and my medical decision making.     Electronical Signed :      Darnelle Maffucci T. Hinda Kehr, MD MPH F.A.C.S.  Professor   Facial Plastic and Reconstructive Surgery  Department of Otolaryngology-Head and Neck Surgery   PI#: [phone]  Pager: [phone]

## 2015-05-07 ENCOUNTER — Ambulatory Visit: Payer: Self-pay | Attending: Otolaryngology | Admitting: Otolaryngology

## 2015-05-07 DIAGNOSIS — H57819 Brow ptosis, unspecified: Secondary | ICD-10-CM

## 2015-05-07 DIAGNOSIS — H02836 Dermatochalasis of left eye, unspecified eyelid: Secondary | ICD-10-CM

## 2015-05-07 DIAGNOSIS — H02833 Dermatochalasis of right eye, unspecified eyelid: Secondary | ICD-10-CM

## 2015-05-07 DIAGNOSIS — R238 Other skin changes: Secondary | ICD-10-CM

## 2015-05-07 NOTE — Nursing Note (Signed)
Patient identification verified x 2 Rebecca Warner 1961/02/24). Pain level and barriers assessed. Medication reconciled. Allergies checked. [name], [place]

## 2015-05-07 NOTE — Progress Notes (Addendum)
05/07/2015    HPI:  Rebecca Warner is a 54yr old female presenting 6 days status post bilateral upper and lower blepharoplasties. She complains of swelling and pain to eyelids and surrounding areas but pain is well controlled.     PHYSICAL EXAMINATION:  Appropriate edema and ecchymoses were present after blepharoplasty.     PROCEDURE:  After informed consent was completed with risks, benefits and alternatives discussed in detail, the patient expressed understanding and wished to proceed. Sutures were removed and wounds were cleaned and suture removal completed. The wound  edges are viable and well perfused.     IMPRESSION: [name]  is 6 status Mohs defect reconstruction    1. Facial aging    2. Dermatochalasis of both eyelids    3. Brow ptosis         PLAN: The plan will be to use wound care, antibiotic ointment shifting to Aquaphor and paper tape along the scars.  We used tape on the site for seven more days. I have asked the patient to continue wound care until follow up with me.    Recommend lymphatic drainage with Sharee Pimple in next several weeks.        Future Appointments  Date Time Provider [address]   05/10/2015 2:00 PM [facility] [facility] ENT [facility]   05/23/2015 11:15 AM Juanito Doom, MD [facility] ENT [facility]          SCRIBE DISCLAIMER:  This note was scribed by Joline Maxcy, a trained medical scribe, in the presence of Dr. Hinda Kehr.    PROVIDER DISCLAIMER:  This document serves as my personal record of services that I personally performed and was taken in my presence. It was created on 05/07/2015 on my behalf by Joline Maxcy, a trained medical scribe.      I have reviewed this document and agree that this note accurately and completely reflects the history and exam findings, the patient care provided, and my medical decision making.     Electronical Signed :      Darnelle Maffucci T. Hinda Kehr, MD MPH F.A.C.S.  Professor   Facial Plastic and Reconstructive Surgery  Department of  Otolaryngology-Head and Neck Surgery   PI#: [phone]  Pager: [phone]

## 2015-05-10 ENCOUNTER — Ambulatory Visit: Payer: Self-pay | Admitting: Case Manager/Care Coordinator

## 2015-05-10 DIAGNOSIS — IMO0001 Reserved for inherently not codable concepts without codable children: Principal | ICD-10-CM

## 2015-05-23 ENCOUNTER — Ambulatory Visit: Payer: Self-pay | Attending: Otolaryngology | Admitting: Otolaryngology

## 2015-05-23 DIAGNOSIS — H57819 Brow ptosis, unspecified: Secondary | ICD-10-CM

## 2015-05-23 DIAGNOSIS — H02836 Dermatochalasis of left eye, unspecified eyelid: Secondary | ICD-10-CM

## 2015-05-23 DIAGNOSIS — R238 Other skin changes: Secondary | ICD-10-CM

## 2015-05-23 DIAGNOSIS — H02833 Dermatochalasis of right eye, unspecified eyelid: Secondary | ICD-10-CM

## 2015-05-23 NOTE — Progress Notes (Signed)
Patient identification verified x 2. Allergies checked. Pain level and barriers assessed. Medication reconciled.Karsten Vaughn, [name] LVN

## 2015-05-23 NOTE — Progress Notes (Addendum)
05/23/2015  Post op Rebecca Warner  HPI:  Rebecca Warner is a 54yr old female presenting 3 weeks status post bilateral upper and lower blepharoplasties. She complains of swelling to  eyelids and surrounding areas but notes some soreness to left lower eyelid; her pain is well controlled. She is concerned that her right eye looks "puckered."     PHYSICAL EXAMINATION:  Appropriate edema and ecchymoses were present after reconstruction.     IMPRESSION: Rebecca Warner  is 3 weeks status Mohs defect reconstruction    1. Dermatochalasis of both eyelids    2. Brow ptosis    3. Facial aging         PLAN: The plan will be to use Aquaphor and paper tape along the scars. Reassured patient that swelling to bilateral eyes will continue to dissipate. I have asked the patient to continue wound care until follow up with me.     Plan botox on her return visit.     SCRIBE DISCLAIMER:  This note was scribed by Joline Maxcy, a trained medical scribe, in the presence of Dr. Hinda Kehr.    PROVIDER DISCLAIMER:  This document serves as my personal record of services that I personally performed and was taken in my presence. It was created on 05/23/2015 on my behalf by Joline Maxcy, a trained medical scribe.    I have reviewed this document and agree that this note accurately and completely reflects the history and exam findings, the patient care provided, and my medical decision making.     Electronical Signed :      Darnelle Maffucci T. Hinda Kehr, MD MPH F.A.C.S.  Professor   Facial Plastic and Reconstructive Surgery  Department of Otolaryngology-Head and Neck Surgery   PI#: [phone]  Pager: [phone]

## 2015-05-29 ENCOUNTER — Ambulatory Visit: Payer: Self-pay

## 2015-06-11 ENCOUNTER — Ambulatory Visit: Payer: Self-pay | Admitting: Otolaryngology

## 2015-06-11 DIAGNOSIS — H57819 Brow ptosis, unspecified: Secondary | ICD-10-CM

## 2015-06-11 DIAGNOSIS — H02836 Dermatochalasis of left eye, unspecified eyelid: Secondary | ICD-10-CM

## 2015-06-11 DIAGNOSIS — H02833 Dermatochalasis of right eye, unspecified eyelid: Secondary | ICD-10-CM

## 2015-06-11 DIAGNOSIS — R238 Other skin changes: Principal | ICD-10-CM

## 2015-06-11 NOTE — Progress Notes (Addendum)
06/11/2015     HPI: Rebecca Warner is a 54yr old female presenting 1 month days status post bilateral upper and lower blepharoplasties. She complains of swelling and pain to eyelids and surrounding areas but pain is well controlled.     PHYSICAL EXAMINATION: Appropriate edema and ecchymoses were present after blepharoplasty.   slight tight medial canthal scarring noted by patient, but is difficult to see.  she percieves a left medial canthal ligament difference from the right as well.       IMPRESSION: Rebecca Warner is 1 month status   blepharoplasty     PLAN: I have asked the patient to continue scar massage until follow up with me.    Future Appointments  Date Time Provider [place]   08/22/2015 1:30 PM Juanito Doom, MD [facility] ENT [place]:  This note was scribed on 06/11/2015 by Joline Maxcy, a trained medical scribe, in the presence of Dr. Hinda Kehr.    PROVIDER DISCLAIMER:  This document serves as my personal record of services that I personally performed and was taken in my presence. It was created on 06/11/2015 on my behalf by Joline Maxcy, a trained medical scribe.      I have reviewed this document and agree that this note accurately and completely reflects the history and exam findings, the patient care provided, and my medical decision making.     Electronical Signed :

## 2015-06-11 NOTE — Progress Notes (Signed)
Patient identification verified x 2. Allergies checked. Pain level and barriers assessed. Medication reconciled.Melinda Gwinner, [name] LVN

## 2015-07-04 NOTE — Progress Notes (Signed)
Kyree came in for a MLD treatment.  Erskine Emery, Aesthetician  Facial Plastic and Reconstructive Surgery

## 2015-07-07 NOTE — Progress Notes (Signed)
The patient was seen by the aesthetician.  I reviewed and agree with the aesthetician's findings and plan as outlined in the aesthetician's note above.         Electronically signed by:    Jnai Snellgrove Mark Ghislaine Harcum, M.D., FACS  Professor and Director of   Facial Plastic Surgery  [facility]  PI: [id number]  Pgr: [phone]

## 2015-08-22 ENCOUNTER — Encounter: Payer: Self-pay | Admitting: Otolaryngology

## 2015-10-26 ENCOUNTER — Ambulatory Visit: Payer: Self-pay | Admitting: Otolaryngology

## 2015-12-31 ENCOUNTER — Other Ambulatory Visit: Payer: Self-pay | Admitting: Family Medicine

## 2016-01-01 MED ORDER — HYDROCHLOROTHIAZIDE 25 MG TABLET: 25.0000 mg | ORAL_TABLET | Freq: Every day | ORAL | 1 refills | Status: DC

## 2016-02-04 ENCOUNTER — Encounter: Payer: Self-pay | Admitting: Otolaryngology

## 2016-02-11 ENCOUNTER — Ambulatory Visit: Payer: Self-pay | Attending: Otolaryngology | Admitting: Otolaryngology

## 2016-02-11 DIAGNOSIS — H02833 Dermatochalasis of right eye, unspecified eyelid: Secondary | ICD-10-CM

## 2016-02-11 DIAGNOSIS — H57819 Brow ptosis, unspecified: Secondary | ICD-10-CM

## 2016-02-11 DIAGNOSIS — H02836 Dermatochalasis of left eye, unspecified eyelid: Secondary | ICD-10-CM

## 2016-02-11 DIAGNOSIS — R238 Other skin changes: Secondary | ICD-10-CM

## 2016-02-11 NOTE — Nursing Note (Signed)
Patient identification verified Rebecca Warner [birth year]) x2. Allergies checked.Pain level and barriers assessed. Medication reconciled. Benedetto Goad, [name] LVN

## 2016-02-12 ENCOUNTER — Encounter: Payer: Self-pay | Admitting: Otolaryngology

## 2016-02-12 NOTE — Progress Notes (Signed)
PATIENT: Rebecca Warner LOCATION: [facility]   MR #: [id number] SEX: female  AGE: 10yr  DATE OF SERVICE: 02/11/2016 DOB: 05/06/1961    OTOLARYNGOLOGY/FACIAL PLASTIC SURGERY CLINIC NOTE    CHIEF COMPLAINT:  Facial aging    HISTORY OF PRESENT ILLNESS:    Rebecca Warner is a 46yr year old female with facial aging presenting 9 months s/p bilateral upper and lower blepharoplasties. She is generally pleased with results of this procedure.   she is distressed that there remains some asymmetry of the brow ptosis (which was not treated with blepharoplasty) and is interested in pursuing additional treatment.     No rhinorrhea.  Rebecca Warner denies odynophagia, dysphagia or otalgia.    PAST MEDICAL HISTORY.    Past Medical History   Diagnosis Date    Back pain        PAST SURGICAL HISTORY    Past Surgical History   Procedure Laterality Date    Pr cesarean delivery only       C-section, low cervical x 2    Blepharoplasty Bilateral 05/01/2015     upper and lower       ALLERGIES    Amoxicillin and Pcn (penicillin) [penicillins]    MEDICATIONS    Current Outpatient Prescriptions   Medication Sig    Aspirin 325 mg Tablet Tablet     Aspirin, Buffered (BUFFERIN) 325 mg Tablet Tablet Take 650 mg by mouth two times daily if needed.    B Complex Vitamins (B-50 COMPLEX) Tablet Take  by mouth every morning.    Cholecalciferol, Vitamin D3, (VITAMIN D-3) 2,000 unit Tablet Take  by mouth.    Cyanocobalamin (VITAMIN B-12) 500 mcg Lozenge Take  by mouth.    DIPHENHYDRAMINE HCL (BENADRYL PO)     Hydrochlorothiazide (HYDRO-DIURIL) 25 mg Tablet Take 1 tablet by mouth every morning.    Ketotifen Fumarate (ZADITOR) 0.025 % Ophthalmic Solution     LORATADINE PO     PROGESTERONE MISC Apply  to the affected area. Bid     No current facility-administered medications for this visit.        SOCIAL HISTORY:  Social History     Social History    Marital status: MARRIED     Spouse name: N/A    Number of children: 2    Years of education: N/A     Occupational  History    Airline pilot      Social History Main Topics    Smoking status: Never Smoker    Smokeless tobacco: Never Used    Alcohol use 4.2 oz/week     7 Glasses of wine per week      Comment: 1 glass at night    Drug use: No    Sexual activity: Not on file     Social History   Substance Use Topics    Smoking status: Never Smoker    Smokeless tobacco: Never Used    Alcohol use 4.2 oz/week     7 Glasses of wine per week      Comment: 1 glass at night       I did review patient's past medical and family/social history, no changes noted.     Physical examination:    The patient is a well-developed, well- nourished 55yr-old female in no acute distress.    Psychiatric:  Alert and oriented x3.  The patient has a normal but anxious affect.    Skin:  The patient shows no signs of skin irritation, rash, or  erythema.    HEENT exam:  Normocephalic, atraumatic.      Eyes:  Pupils equally round and reactive to light, extraocular movements are intact, conjunctiva and sclera are clear.  very slight left dermatochalasia and premorbid left >right brow ptosis    Nasal exam:  Nasal septum is midline and without lesions or erythema. No crusting or purulence is noted.      Oral cavity/oropharynx:  Mucous membranes without lesion, tongue midline, no lesions or masses    Ears:   The External ear was without lesions or deformity    Neurologic exam:  Cranial nerves II-XII are grossly intact.  Facial sensation and motion are normal.  There is normal palatal motion.      Musculoskeletal: The trapezius and sternocleidomastoid muscles are equal bilaterally and the tongue is midline.      The neck is without lymphadenopathy or thyromegaly     Assessment:: Rebecca Warner is a 27yr year old female with facial aging s/p bilateral upper and lower blepharoplasties.     1. Facial aging    2. Dermatochalasis of both eyelids    3. Brow ptosis         RECOMMENDATIONS:  Counselling and discussion with patient included   1)probable  Diagnosis,  2)  probable  treatment options, and    3) recommendations for management with the following:    AFTER EXTENSIVE DISCUSSION ABOUT THE BROW PTOSIS THE VERY MINIMAL ASSYMETRY OF THE BROW POSITION AND THE LOWER EYELID INCISIONS HAVING HEALED WELL, we decided that she would not be interested in a brow lift.   She would like to be and was fairly insistent that the left upper eyelid skin should be removed laterally.  I advised against extensive further surgery but agreed that I am happy to remove a small amount of the left lateral hooding instead of doing a browlift due to her interest in less downtime.  Again I was very insistent that the result is really very good and advised that she discuss with family.      No future appointments yet as she will call with her decision.     Barriers to Learning assessed: none. Patient verbalizes understanding of teaching and instructions.    Electronically signed by:    Rebecca Warner, M.D., MPH,  F.A.C.S.  Professor & Director  Facial Plastic and Reconstructive Surgery  Department of Otolaryngology-Head and Neck Surgery  PI#: [phone]  Pager: [phone]

## 2016-03-04 ENCOUNTER — Encounter: Payer: Self-pay | Admitting: Family Medicine

## 2016-03-04 ENCOUNTER — Other Ambulatory Visit: Payer: Self-pay | Admitting: Family Medicine

## 2016-03-04 ENCOUNTER — Ambulatory Visit: Payer: BC Managed Care – PPO | Admitting: Family Medicine

## 2016-03-04 VITALS — BP 165/93 | HR 73 | Temp 98.5°F | Wt 134.9 lb

## 2016-03-04 DIAGNOSIS — Z1231 Encounter for screening mammogram for malignant neoplasm of breast: Secondary | ICD-10-CM

## 2016-03-04 DIAGNOSIS — R140 Abdominal distension (gaseous): Principal | ICD-10-CM

## 2016-03-04 DIAGNOSIS — R102 Pelvic and perineal pain: Secondary | ICD-10-CM

## 2016-03-04 DIAGNOSIS — N9489 Other specified conditions associated with female genital organs and menstrual cycle: Secondary | ICD-10-CM

## 2016-03-04 DIAGNOSIS — N926 Irregular menstruation, unspecified: Secondary | ICD-10-CM

## 2016-03-04 DIAGNOSIS — Z0000 Encounter for general adult medical examination without abnormal findings: Principal | ICD-10-CM

## 2016-03-04 NOTE — Nursing Note (Signed)
Vital signs taken, allergies verified, screened for pain.  Aleeha Boline, MA I

## 2016-03-05 NOTE — Progress Notes (Signed)
ID/CC:Rebecca Warner is a 55yr old female here abd bloating    S:pt has been noting abd bloating and distention. No change in stooling. No nausea or vomiting.  Feels is getting bloated post prandial.  Occasionally gets RUQ pain.  Noted today while palpating her abd that she could feel her abd aorta pulsing and is concerned that that is not normal.      Is having sensation of pelvic pressure.  Is also still having irreg menses- had pelvic US 2014 with enlarged uterus and small fibroids and a left hemorrhagic ovarian cyst.    Allergies:   Allergies   Allergen Reactions    Amoxicillin Hives    Pcn (Penicillin) [Penicillins] Rash       Meds: Reviewed and updated in EPIC    Patient Active Problem List   Diagnosis    Rosacea    Low vitamin B12 level    Degenerative cervical disc    Cervical radicular pain    Allergic rhinitis due to allergen    Acute atopic conjunctivitis    Asthma    Allergy to mites    Dermatochalasis of both eyelids    Brow ptosis    Facial aging       ROS:   All other systems negative except as noted in the HPI.    OBJECTIVE:  BP (!) 165/93  Pulse 73  Temp 36.9 C (98.5 F) (Tympanic)  Wt 61.2 kg (134 lb 14.7 oz)  LMP 02/22/2016 (Approximate)  BMI 23.34 kg/m2  General: NAD  Chest: no retractions, clear to ascultation; non dyspneic; no fremitus  Cardiac:  RRR w/o murmurs, rubs, or gallops; normal PMI   Abdomen:  Inspection: nondistended,   Bowel sounds: + throughout   Palpation: soft, nontender; No HSM, no hernias   Masses: none; no pulsatile mass but can feel the AA pulse  Murphy's sign; negative          ASSESSMENT AND PLAN:  1. Post prandial abd bloating with occasional RUQ pain - will check abd [place] for cholelithiasis   2. Pelvic pressure - will recheck pelvic US    I did not review patient's past medical and family/social history.    Barriers to Learning assessed: none. Patient verbalizes understanding of teaching and instructions.    Electronically signed by  Grace Isaac. Arnol Mcgibbon,  [facility]  [address], [place] [address]  [phone]

## 2016-03-24 ENCOUNTER — Other Ambulatory Visit: Payer: Self-pay | Admitting: Family Medicine

## 2016-03-24 ENCOUNTER — Telehealth: Payer: Self-pay | Admitting: Family Medicine

## 2016-03-24 DIAGNOSIS — R140 Abdominal distension (gaseous): Principal | ICD-10-CM

## 2016-03-24 MED ORDER — HYDROCHLOROTHIAZIDE 25 MG TABLET: 25.0000 mg | ORAL_TABLET | Freq: Every day | ORAL | 1 refills | Status: DC

## 2016-03-24 NOTE — Telephone Encounter (Signed)
Patient states her Abdomen Ultrasound was ordered as an XRAY instead of Ultrasound. Can MD please re order this as Ultrasound Abdomen complete. Please advise    Thank You    Curtis Sites, [place]

## 2016-03-24 NOTE — Telephone Encounter (Signed)
Called and left a message to patient advising to call back. Operator, when patient calls back please inform her that the order for her [place] has been change. Jyssica Rief [place], [place] I

## 2016-03-24 NOTE — Telephone Encounter (Signed)
Order changed.     Rebecca Tout J Silva-McKenzie, MD  Associate Physician  Family Medicine and Psychiatry   [facility] [place]  Department of Family and Community Medicine

## 2016-03-24 NOTE — Telephone Encounter (Signed)
Patient returning call. Relayed msg. Understood.     Rebecca Warner  M[facility] II

## 2016-04-14 ENCOUNTER — Telehealth: Payer: Self-pay | Admitting: Family Medicine

## 2016-04-14 ENCOUNTER — Ambulatory Visit (HOSPITAL_BASED_OUTPATIENT_CLINIC_OR_DEPARTMENT_OTHER)
Admission: RE | Admit: 2016-04-14 | Discharge: 2016-04-14 | Disposition: A | Discharge status: Home / Self Care | Payer: BC Managed Care – PPO | Source: Ambulatory Visit | Attending: Family Medicine | Admitting: Family Medicine

## 2016-04-14 ENCOUNTER — Ambulatory Visit
Admission: RE | Admit: 2016-04-14 | Discharge: 2016-04-14 | Disposition: A | Discharge status: Home / Self Care | Payer: BC Managed Care – PPO | Source: Ambulatory Visit | Attending: Body Imaging | Admitting: Body Imaging

## 2016-04-14 ENCOUNTER — Ambulatory Visit (HOSPITAL_BASED_OUTPATIENT_CLINIC_OR_DEPARTMENT_OTHER)
Admission: RE | Admit: 2016-04-14 | Discharge: 2016-04-14 | Disposition: A | Discharge status: Home / Self Care | Payer: BC Managed Care – PPO | Source: Ambulatory Visit | Attending: FAMILY PRACTICE | Admitting: FAMILY PRACTICE

## 2016-04-14 DIAGNOSIS — Z1231 Encounter for screening mammogram for malignant neoplasm of breast: Secondary | ICD-10-CM

## 2016-04-14 DIAGNOSIS — D259 Leiomyoma of uterus, unspecified: Secondary | ICD-10-CM

## 2016-04-14 DIAGNOSIS — R140 Abdominal distension (gaseous): Secondary | ICD-10-CM

## 2016-04-14 NOTE — Telephone Encounter (Signed)
Left message on recorder notifying patient to call back regarding results.  Please note there are 2 messages on this patient for 2 different results to be relayed.    Willette Brace, Sr  LVN

## 2016-04-14 NOTE — Telephone Encounter (Signed)
Please call patient - [place] is normal - no acute processes

## 2016-04-14 NOTE — Telephone Encounter (Signed)
Please call patient - single small uterine fibroid.

## 2016-04-16 ENCOUNTER — Telehealth: Payer: Self-pay | Admitting: Family Medicine

## 2016-04-16 DIAGNOSIS — Z0000 Encounter for general adult medical examination without abnormal findings: Principal | ICD-10-CM

## 2016-04-16 DIAGNOSIS — Z130 Encounter for screening for diseases of the blood and blood-forming organs and certain disorders involving the immune mechanism: Secondary | ICD-10-CM

## 2016-04-16 DIAGNOSIS — Z139 Encounter for screening, unspecified: Secondary | ICD-10-CM

## 2016-04-16 NOTE — Telephone Encounter (Signed)
Patient relayed results and copy mailed out per patient's request.    Willette Brace, Sr  LVN

## 2016-04-16 NOTE — Telephone Encounter (Signed)
Patient has cancelled [place] follow up appointment with Dr. Sarita Bottom on 04/18/16. She has scheduled an appointment on 04/23/16 for a wwe.      Patient advised of lab orders and will come in this week to have them done prior to wwe.      Patient is requesting a B12,Ferritin/Iron test to be added. No call back needed.    Willette Brace, Sr  LVN

## 2016-04-16 NOTE — Telephone Encounter (Signed)
Please see messages from 04/14/16. Patient calling in regards to results for Pelvis Utlrasound and Abdominal Ultrasound. Patient was relayed results, she is requesting to have them mailed out to her. She is currently scheduled for 04/18/16 for an Ultra sound follow up, patient is wondering if she should cancel this.    Patient is also requesting a a call with Mammogram results and a copy mailed to her.    Please advise.    Thank you,  Melynda Ripple

## 2016-04-16 NOTE — Telephone Encounter (Signed)
Ok to send [place] abd and pelvis and well as mammo to patient.  Not sure why she is scheduled for a repeat US and or for what region.  I do not see that scheduled or read any recommendations for a repeat

## 2016-04-16 NOTE — Telephone Encounter (Signed)
Orders placed.

## 2016-04-18 ENCOUNTER — Ambulatory Visit: Payer: BC Managed Care – PPO | Admitting: Family Medicine

## 2016-04-21 ENCOUNTER — Ambulatory Visit: Payer: BC Managed Care – PPO | Attending: Family Medicine

## 2016-04-21 DIAGNOSIS — N926 Irregular menstruation, unspecified: Secondary | ICD-10-CM | POA: Insufficient documentation

## 2016-04-21 DIAGNOSIS — Z0000 Encounter for general adult medical examination without abnormal findings: Principal | ICD-10-CM | POA: Insufficient documentation

## 2016-04-21 DIAGNOSIS — Z130 Encounter for screening for diseases of the blood and blood-forming organs and certain disorders involving the immune mechanism: Secondary | ICD-10-CM | POA: Insufficient documentation

## 2016-04-21 DIAGNOSIS — Z139 Encounter for screening, unspecified: Secondary | ICD-10-CM | POA: Insufficient documentation

## 2016-04-21 LAB — COMPREHENSIVE METABOLIC PANEL
ALANINE TRANSFERASE (ALT): 15 U/L (ref 5–54)
ALBUMIN: 4.5 g/dL (ref 3.4–4.8)
ALKALINE PHOSPHATASE (ALP): 27 U/L — AB (ref 35–115)
ASPARTATE TRANSAMINASE (AST): 18 U/L (ref 15–43)
BILIRUBIN TOTAL: 0.7 mg/dL (ref 0.3–1.3)
CALCIUM: 9.4 mg/dL (ref 8.6–10.5)
CARBON DIOXIDE TOTAL: 24 meq/L (ref 24–32)
CHLORIDE: 109 meq/L (ref 95–110)
CREATININE BLOOD: 0.78 mg/dL (ref 0.44–1.27)
GLUCOSE: 89 mg/dL (ref 70–99)
POTASSIUM: 4.2 meq/L (ref 3.3–5.0)
PROTEIN: 7.1 g/dL (ref 6.3–8.3)
SODIUM: 140 meq/L (ref 135–145)
UREA NITROGEN, BLOOD (BUN): 11 mg/dL (ref 8–22)

## 2016-04-21 LAB — CBC WITH DIFFERENTIAL
BASOPHILS % AUTO: 0.4 %
BASOPHILS ABS AUTO: 0 10*3/uL (ref 0–0.2)
EOSINOPHIL % AUTO: 1.2 %
EOSINOPHIL ABS AUTO: 0.1 10*3/uL (ref 0–0.5)
HEMATOCRIT: 40.7 % (ref 36–46)
HEMOGLOBIN: 13.8 g/dL (ref 12.0–16.0)
LYMPHOCYTE ABS AUTO: 1.1 10*3/uL (ref 1.0–4.8)
LYMPHOCYTES % AUTO: 20.8 %
MCH: 29.7 pg (ref 27–33)
MCHC: 33.8 % (ref 32–36)
MCV: 87.9 UM3 (ref 80–100)
MONOCYTES % AUTO: 7.6 %
MONOCYTES ABS AUTO: 0.4 10*3/uL (ref 0.1–0.8)
MPV: 9.2 UM3 (ref 6.8–10.0)
NEUTROPHIL ABS AUTO: 3.7 10*3/uL (ref 1.80–7.70)
NEUTROPHILS % AUTO: 70 %
PLATELET COUNT: 215 10*3/uL (ref 130–400)
RDW: 13 U (ref 0–14.7)
RED CELL COUNT: 4.63 10*6/uL (ref 4.0–5.2)
WHITE BLOOD CELL COUNT: 5.2 10*3/uL (ref 4.5–11.0)

## 2016-04-21 LAB — LIPID PANEL WITH DLDL REFLEX
CHOLESTEROL: 212 mg/dL — AB (ref 0–200)
HDL CHOLESTEROL: 74 mg/dL (ref >=35)
LDL CHOLESTEROL CALCULATION: 131 mg/dL — AB (ref <130)
NON-HDL CHOLESTEROL: 138 mg/dL (ref 0–150)
TOTAL CHOLESTEROL:HDL RATIO: 2.9 (ref <4.0)
TRIGLYCERIDE: 34 mg/dL — AB (ref 35–160)

## 2016-04-21 LAB — VITAMIN D, 25 HYDROXY: VITAMIN D, 25 HYDROXY: 32.8 ng/mL (ref 20.0–50.0)

## 2016-04-21 LAB — FERRITIN: FERRITIN: 24 ng/mL (ref 10–291)

## 2016-04-21 LAB — TSH WITH FREE T4 REFLEX: THYROID STIMULATING HORMONE: 1.48 u[IU]/mL (ref 0.35–3.30)

## 2016-04-21 LAB — FOLLICLE STIMULATING HORMONE: FOLLICLE STIMULATING HORMONE: 50.8 m[IU]/mL

## 2016-04-22 LAB — HEMOGLOBIN A1C
HGB A1C,GLUCOSE EST AVG: 100 mg/dL
HGB A1C: 5.1 % (ref 3.9–5.6)

## 2016-04-22 LAB — VITAMIN B12: VITAMIN B12: 647 pg/mL (ref 213–816)

## 2016-04-23 ENCOUNTER — Encounter: Payer: Self-pay | Admitting: Family Medicine

## 2016-04-23 ENCOUNTER — Ambulatory Visit: Payer: BC Managed Care – PPO | Attending: Family Medicine | Admitting: Family Medicine

## 2016-04-23 VITALS — BP 146/83 | HR 67 | Temp 98.6°F | Resp 14 | Ht 63.5 in | Wt 131.8 lb

## 2016-04-23 DIAGNOSIS — Z01419 Encounter for gynecological examination (general) (routine) without abnormal findings: Secondary | ICD-10-CM | POA: Insufficient documentation

## 2016-04-23 DIAGNOSIS — Z0000 Encounter for general adult medical examination without abnormal findings: Secondary | ICD-10-CM

## 2016-04-23 DIAGNOSIS — R1013 Epigastric pain: Secondary | ICD-10-CM | POA: Insufficient documentation

## 2016-04-23 DIAGNOSIS — Z1211 Encounter for screening for malignant neoplasm of colon: Principal | ICD-10-CM | POA: Insufficient documentation

## 2016-04-23 DIAGNOSIS — Z23 Encounter for immunization: Secondary | ICD-10-CM | POA: Insufficient documentation

## 2016-04-23 MED ORDER — SUCRALFATE 1 GRAM TABLET: 1.0000 g | ORAL_TABLET | Freq: Four times a day (QID) | ORAL | 1 refills | Status: DC

## 2016-04-23 MED ORDER — RANITIDINE 150 MG TABLET: 150.0000 mg | ORAL_TABLET | Freq: Two times a day (BID) | ORAL | 11 refills | Status: DC

## 2016-04-23 NOTE — Nursing Note (Signed)
Vital signs taken, allergies verified, screened for pain, medication  history taken.  Erika R Jones, LVN;

## 2016-04-23 NOTE — Nursing Note (Signed)
Per patient home reading blood pressure this morning was 125/77.  Willette Brace, Sr  LVN

## 2016-04-23 NOTE — Nursing Note (Signed)
Immunization VIS documentation(s) were given to patient to review. All questions were answered and the patient consented to the Immunization(s) being given. Patient allergies were reviewed and no contraindications were found. The immunization(s) were given as ordered. The patient was observed for any immediate reactions to the vaccine. None were observed. Jekhi Bolin Pierson, LVN

## 2016-04-23 NOTE — Progress Notes (Signed)
ID/CC:Jaliana K Glidewell is a 55yr old female here for annual    S: recent abd and pelvic US normal other than small fibroid.  abd bloating resolved since stopping yogurt with nuts, still gets occassional pain    Allergies:   Allergies   Allergen Reactions    Amoxicillin Hives    Pcn (Penicillin) [Penicillins] Rash       Meds: Reviewed and updated in EPIC    PMH: PNA, Rosacea, severe cervical DJD    PSHx: C/s x2 , uterine polyp resection    OB/GYN:  G3P2; SAB1; historically normal paps, last done 2014 with cotesting, last mammo 7/17 - normal; husband with vasectomy    FAm History:  M- thyroid, HTN; F-healthy ; Bx1 - healthy; MGM - colon [place] 80s; PGF - [place]    Soc History: Married, 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Surveyor, mining ; Pharmacist, hospital in history department; nonsmoker; etoh -  light reg; exercise regularly      REVIEW OF SYSTEMS:neg other than subjective    OBJECTIVE:  Temp 37 C (98.6 F) (Temporal)  Resp 14  Ht 1.613 m (5' 3.5")  Wt 59.8 kg (131 lb 13.4 oz)  BMI 22.99 kg/m2  GEN: NAD, Nondyspneic, Nonpallor, No juandice,  HEENT:  Head: NCAT  Eyes:PERRLA, EOMi, conj-clear; sclera - clear  Ears: normal auricle and tragus; hearing is grossly normal  Canals: without abnormalities  Tms: normal  Nose: nasal mucus normal, normal septum and turbinates  Mouth: normal appearance of the lips, no cyanosis, no ulcers or lesions.  Oral mucosa is normal. No abscesses; no obvious caries.  Tongue is normal  Throat: moist without inflammation, hard and soft palates appear normal   Neck: supple without crepitus; symmetrical and trachea is midline   Thyroid: not enlarged, no nodules   No  Cervical or Supraclavicular LAD  No Carotid Bruits/JVD  Chest: no retractions, clear to ascultation; non dyspneic; no fremitus  Cardiac:  RRR w/o murmurs, rubs, or gallops; normal PMI   Abdomen:  Inspection: nondistended,   Bowel sounds: + throughout   Palpation: soft, nontender; area of tenderness on the right is at the tip of the 12 th rib; no RRG; No HSM, no hernias    Masses: none; no pulsatile mass   Murphy's sign; negative  Breasts: non concerning exam; no masses, nipple retraction, or nipple discharge    Pelvic exam:not indicated  SKIN: no rash, ulcers,or lesion; warm and dry          ASSESSMENT AND PLAN:  1. Annual exam - no pap indicated until  2019; mammo- 7/17. FIT ordered for colon CA screening.  Patient declines colonoscopy due to concerns about proper sterilization  2. Right breast mastitis - [place] short interval looks resolved.  Has repeat mammo in 4 months to recheck architectural change in right breast.  Just done - benign and normal findings  3. RLQ pain with bloating - resolved with elimination diet of yogurt with nuts.  But patient requesting trial of Carafate as still gets occasional pain.  I suspect a 12th rib dysfunction but short trial of carafate is ok  4.  Need Tdap     I did review patient's past medical and family/social history, no changes noted.    Barriers to Learning assessed: none. Patient verbalizes understanding of teaching and instructions.    Electronically signed by  Grace Isaac. Falisha Osment, [facility]  [address] [address]  [phone]

## 2016-05-01 ENCOUNTER — Other Ambulatory Visit: Payer: BC Managed Care – PPO

## 2016-05-01 DIAGNOSIS — Z1211 Encounter for screening for malignant neoplasm of colon: Principal | ICD-10-CM

## 2016-05-01 DIAGNOSIS — D3131 Benign neoplasm of right choroid: Secondary | ICD-10-CM

## 2016-05-01 DIAGNOSIS — H04123 Dry eye syndrome of bilateral lacrimal glands: Secondary | ICD-10-CM

## 2016-05-01 HISTORY — DX: Dry eye syndrome of bilateral lacrimal glands: H04.123

## 2016-05-01 HISTORY — DX: Benign neoplasm of right choroid: D31.31

## 2016-05-02 LAB — FECAL BY IMMUNOASSAY (FIT)

## 2016-05-07 ENCOUNTER — Other Ambulatory Visit: Payer: Self-pay | Admitting: Otolaryngology

## 2016-05-07 DIAGNOSIS — H02833 Dermatochalasis of right eye, unspecified eyelid: Principal | ICD-10-CM

## 2016-05-07 DIAGNOSIS — H02836 Dermatochalasis of left eye, unspecified eyelid: Secondary | ICD-10-CM

## 2016-05-13 ENCOUNTER — Ambulatory Visit
Admission: RE | Admit: 2016-05-13 | Discharge: 2016-05-13 | DRG: 125 | Disposition: A | Discharge status: Home / Self Care | Payer: Self-pay | Source: Ambulatory Visit | Attending: Otolaryngology | Admitting: Otolaryngology

## 2016-05-13 ENCOUNTER — Encounter
Admission: RE | Disposition: A | Discharge status: Home / Self Care | Payer: Self-pay | Source: Ambulatory Visit | Attending: Otolaryngology

## 2016-05-13 DIAGNOSIS — H02834 Dermatochalasis of left upper eyelid: Principal | ICD-10-CM | POA: Insufficient documentation

## 2016-05-13 DIAGNOSIS — M549 Dorsalgia, unspecified: Secondary | ICD-10-CM | POA: Insufficient documentation

## 2016-05-13 DIAGNOSIS — H02833 Dermatochalasis of right eye, unspecified eyelid: Secondary | ICD-10-CM | POA: Insufficient documentation

## 2016-05-13 DIAGNOSIS — H02836 Dermatochalasis of left eye, unspecified eyelid: Secondary | ICD-10-CM

## 2016-05-13 SURGERY — REVISION, SCAR, FACE
Anesthesia: Local | Site: Eye | Laterality: Left | Wound class: Clean

## 2016-05-13 MED ORDER — FLUMAZENIL 0.1 MG/ML INTRAVENOUS SOLUTION: 0.2000 mg | INTRAVENOUS | Status: DC | PRN

## 2016-05-13 MED ORDER — NALOXONE 0.4 MG/ML INJECTION SOLUTION: 0.4000 mg | INTRAMUSCULAR | Status: DC | PRN

## 2016-05-13 MED ORDER — BACITRACIN 500 UNIT/GRAM EYE OINTMENT
TOPICAL_OINTMENT | OPHTHALMIC | Status: DC | PRN
  Administered 2016-05-13: 0.25 [in_us] via OPHTHALMIC

## 2016-05-13 MED ORDER — LIDOCAINE 1 %-EPINEPHRINE 1:100,000 INJECTION SOLUTION
Status: DC | PRN
  Administered 2016-05-13: 1.5 mL

## 2016-05-13 MED ORDER — INTRAOP NACL 0.9% 1000 ML IRRIGATION BOTTLE
Status: DC | PRN
  Administered 2016-05-13: 500 mL

## 2016-05-13 SURGICAL SUPPLY — 12 items
APPLICATOR Q TIPS 3IN STERILE (Other) ×2 IMPLANT
BLADE KNIFE 15C (Blade) ×2 IMPLANT
BOWL SMALL STERILE (Other) ×2 IMPLANT
CAUTERY LOOP TIP HIGH TEMPERTURE CORDLESS BOVIE (Cautery) ×2 IMPLANT
COVER EZ LIGHT HANDLE DISPOSABLE (Other) ×2 IMPLANT
DRAPE TOWEL CLOTH 17 X 27IN STERILE BLUE 4 PACK (Drape) ×2 IMPLANT
GLOVES BIOGEL SKINSENSE 8.5 LATEX FREE (Glove) ×2 IMPLANT
HEADREST DONUT 9IN (M10-090) (Other) ×2 IMPLANT
NDL RESTRICTED 30G 1/2IN 305106 (Needle) ×2 IMPLANT
SPONGE GAUZE 4 X 4IN 12PLY STERILE 10 PACK (Dressing) ×2 IMPLANT
SPONGE X-RAY STR 4X4IN GAUZE 441002 (Sponge) ×2 IMPLANT
SUTURE ETHILON 6-0 PS-3 18 BLACK (Suture) ×2 IMPLANT

## 2016-05-13 NOTE — Brief Op Note (Addendum)
RESIDENT BRIEF OPERATIVE NOTE  Service ENT  05/13/2016, 12:46    Date of Service: 05/13/2016    Pre-Op Diagnosis: Dermatochalasis of eyelids of both eyes [H02.833, H02.836]    Post-Op Diagnosis:  Post-Op Diagnosis Codes:     * Dermatochalasis of eyelids of both eyes [H02.833, H02.836]    Procedure Performed/Description:  Procedure(s) with comments:  REVISION, SCAR, FACE (Left) - Revision blepharopalsty left    Name of Surgeon and Assistants:  Surgeon(s) and Role:     * Juanito Doom, MD - Primary     * Collene Schlichter, MD - Resident - Assisting    Type of Anesthesia:  Local    DVT Prophylaxis:  None    Findings:  Excess skin on right upper lateral eyelid. Bilateral brow ptosis.    Specimens Removed: None    EBL: 1 mL    Drains:  None    Fluids:  None    Complications:  None    Outcome: Stable to PACU    Electronically Signed By:  Lamar Benes, MD  Otolaryngology - PGY4  PI# [id number]  Personal Pager [phone]  Service Pager [phone]    ATTENDING OPERATIVE NOTE    Operating Room Procedure Presence:  I was physically present for and performed the entire procedure.    The information contained on this form is true and accurate to the best of my knowledge.  Further, I understand that if I misrepresent, falsify or conceal information regarding my participation in the professional service described above, I may be subject to fine, imprisonment, or civil penalty under applicable federal laws.    Electronically signed by:     Attending:    Serena Colonel. Hinda Kehr, M.D., MPH,  F.A.C.S.  Professor & Director  Facial Plastic and Reconstructive Surgery  Department of Otolaryngology-Head & Neck Surgery  PI#: [phone]  Pager: [phone]

## 2016-05-13 NOTE — Procedures (Signed)
PATIENT:  Rebecca Warner, Rebecca Warner  MR #:  [id number]  DOB:  1961-01-17  SEX:  F  AGE:  56  OPERATION DATE:  05/13/2016  INPATIENT OPERATION RECORD    PREOPERATIVE DIAGNOSIS:    Dermatochalasis of the eyelids of both eyes and bilateral brow ptosis.    POSTOPERATIVE DIAGNOSIS:    The same.    PROCEDURE PERFORMED:    Revision left blepharoplasty.    TYPE OF ANESTHESIA:    Low flow.    DVT PROPHYLAXIS:    None    FINDINGS:    Excess skin of the left upper lateral eyelid bilateral brow ptosis.    SPECIMENS REMOVED:    None.    ESTIMATED BLOOD LOSS:    1 mL.    FLUIDS:    None.    DRAIN PLACEMENT:    None.    OPERATIVE COMPLICATION:    None.    OUTCOME:    Stable to PACU,    INDICATION FOR PROCEDURE:    [name] is a 55 year old woman with a history of bilateral  blepharoplasty and dermatochalasis.  She had some excess skin above  the suture line on the left side.  She also has a history of bilateral  brow ptosis.  She was offered excision of this excess skin.  We had  extensive discussion made that this would not address her brow ptosis,  which would require a brow lift procedure.    INFORMED CONSENT:    The risks, benefits, and alternatives to procedure were discussed with  the patient who demonstrated understanding and asked [place] to proceed.    DESCRIPTION OF PROCEDURE IN DETAIL:    The patient was identified in the Preoperative Holding Area, consent  was confirmed in the chart.  The patient was then brought back to the  operating suite where a Preoperative Huddle was performed with all in  agreement.  The patient was placed on the operating table in the  supine position and the planned skin excision was demarcated on the  left upper eyelid.  Local anesthetic was then injected through the 1%  lidocaine with 1:200,000 of epinephrine with bicarbonate. Once this  had adequate time to take effect, the patient was prepped in the  standard fashion for the procedure using Betadine, he was then draped.  We made an incision around the  area of the excess skin and elliptical  performed down through the dermis and removed with scissors.  The  wound was closed with 6-0 nylon in an interrupted fashion.  The wound  was then dressed with Baci ophthalmic.    The patient was then taken to the PACU in stable condition.    POSTOPERATIVE PLAN:    Postoperative instructions were provided to the patient.    NAME OF SURGEONS AND ASSISTANTS:    Surgeon:  Juanito Doom, MD, MPH, FACS  Assistant Surgeon:  Volney American, MD        Report Electronically Signed - 05/19/2016 10:05:10 by  Volney American, MD  Resident  Otolaryngology    Report Electronically Signed - 05/21/2016 08:29:36 by  Juanito Doom, MD, MPH, [facility]  Professor    Otolaryngology    RDD/hh  D:  05/13/2016 15:59:29 PDT/PST  T:  05/13/2016 16:49:59 PDT/PST  Job #:  [id number] / [id number]

## 2016-05-13 NOTE — Discharge Instructions (Signed)
MEDICATIONS  1. Resume all home medications as directed.  2. Start new discharge medications as directed.  3. Do not drive or operate machinery while taking narcotic pain medications.    ACTIVITY  1. No heavy lifting of more than 10 pounds for the next 2 weeks.  2. May shower with soap 2 days after surgery - do not scrub your incision site.  3. No baths or submersion in water (swimming pool, jacuzzi, etc) for the next 4-6 weeks.    DIET  1. Resume same diet prior to your hospital admission.    WOUND  1. Clean any crusting with saline.  2. Apply antibiotic ointment 3 times daily to keep incision moist  3. Keep incision out of UV exposure as much as possible over next 12 months.  4. Apply ice to left upper eyelid to help decrease swelling.  Your sutures will be removed in 7 days.    RETURN  1. Follow-up in the ENT clinic in [place] on Wednesday (this will be scheduled for you).   2. Call [phone] to confirm or schedule your appointment.  3. Call [phone] or go to the Emergency Department near you for any symptoms such as fevers with temperature greater than 101.5 F, chills, persistent nausea and vomiting, severe pain not controlled with medications, purulent or foul-smelling drainage from the wound site, or for any acute problems or illnesses.

## 2016-05-13 NOTE — H&P (Addendum)
Preoperative History & Physical     Rebecca Warner is a 46yr F who is scheduled to undergo excision of excess eyelid skin of left upper eyelid.  Since the patient's last visit with [place], there have been no changes to the documented indications for the procedure.  The risks, alternatives and benefits of the procedure have been discussed in detail and all of the patient's questions have been answered.     Past Medical History:    Past Medical History:   Diagnosis Date    Back pain        Medications:    No current facility-administered medications on file prior to encounter.      Current Outpatient Prescriptions on File Prior to Encounter   Medication Sig Dispense Refill    Aspirin, Buffered (BUFFERIN) 325 mg Tablet Tablet Take 650 mg by mouth two times daily if needed.      B Complex Vitamins (B-50 COMPLEX) Tablet Take  by mouth every morning. 30 tablet     Cholecalciferol, Vitamin D3, (VITAMIN D-3) 2,000 unit Tablet Take  by mouth.      Cyanocobalamin (VITAMIN B-12) 500 mcg Lozenge Take  by mouth.      DIPHENHYDRAMINE HCL (BENADRYL PO)       Hydrochlorothiazide (HYDRO-DIURIL) 25 mg Tablet Take 1 tablet by mouth every morning. 30 tablet 1    Ketotifen Fumarate (ZADITOR) 0.025 % Ophthalmic Solution       LORATADINE PO       PROGESTERONE MISC Apply  to the affected area. Bid      Sucralfate (CARAFATE) 1 gram Tablet Take 1 tablet by mouth 4 times daily. take on an empty stomach 120 tablet 1       Past Surgical History:    Past Surgical History:   Procedure Laterality Date    BLEPHAROPLASTY Bilateral 05/01/2015    upper and lower    PR CESAREAN DELIVERY ONLY      C-section, low cervical x 2       Allergies:    Allergies as of 05/07/2016 Elta [place] as Reviewed 04/23/2016   Allergen Reaction Noted    Amoxicillin Hives 07/11/2013    Pcn (penicillin) [penicillins] Rash 12/31/2012       Social history:    Social History     Social History    Marital status: MARRIED     Spouse name: N/A    Number of children: 2     Years of education: N/A     Occupational History    Airline pilot      Social History Main Topics    Smoking status: Never Smoker    Smokeless tobacco: Never Used    Alcohol use 4.2 oz/week     7 Glasses of wine per week      Comment: 1 glass at night    Drug use: No    Sexual activity: Not on file     Other Topics Concern    Not on file     Social History Narrative    PREVIOUS TREATMENTS:     Excellent pain relief was achieved with none.    Moderate pain relief was achieved with traction, physical therapy.    No pain relief was achieved with traction, physical therapy, exercise.        FAMILY LIVING CIRCUMSTANCES:          The patient is currently lives with their spouse  Family history:    Family History   Problem Relation Age of Onset    Thyroid Mother      goiter as a child    Non-contributory Father     Hypertension Paternal Grandmother     Cancer Maternal Grandmother     Heart Paternal Grandfather        Review of systems:    A complete review of systems was performed.    PHYSICAL EXAMINATION    General: Well-nourished and developed, alert and appropriate, no distress, breathing quietly.    Eyes:  Pupils equally round and reactive to light, extraocular movements are intact, conjunctiva and sclera are clear.  very slight left dermatochalasia and premorbid left >right brow ptosis    Ears:  External auditory canals clear and tympanic membranes intact, no effusions.      Nasal cavity:  No anterior lesions, masses or polyps.    Oral cavity/oropharynx:  Buccal mucosa is moist without lesions or masses, tongue midline and palate elevates symmetrically.       Neck:  There is no lymphadenopathy or thyromegaly.      Neurologic:  Cranial nerves II-XII grossly intact.      Lungs:  Clear to auscultation all lobes.    Heart: Regular rate and rhythm    Impression:      Rebecca Warner is a 71yr year old female with facial aging s/p bilateral upper and lower blepharoplasties.     1. Facial  aging    2. Dermatochalasis of both eyelids    3. Brow ptosis          PLAN:      Proceed with excision of left upper eyelid skin.      Electronically Signed By:  Lamar Benes, MD  Otolaryngology - PGY4  PI# [phone]  Personal Pager [phone]  Service Pager [phone]    Attending Note    The patient was seen and examined with the resident.  I agree with the documented history and physical examination.  We formulated the plan together.    We reviewed the risks, benefits and alternatives including, but not limited to, bleeding, infection, need for further surgery, scarring, numbness, change in sensation, need for revision surgeries, poor cosmetic outcome, lack of symmetry, facial nerve weakness,  hematoma, dry eyes, vision changes.  The patient expressed understanding and wishes to proceed.    Future Appointments  Date Time Provider Butts   05/19/2016 11:15 AM NURSE ENTPLA ENTPLA ENT [facility]     Electronically signed by:  Serena Colonel. Hinda Kehr, M.D., MPH,  F.A.C.S.  Professor & Director  Facial Plastic and Reconstructive Surgery  Department of Otolaryngology-Head and Neck Surgery  PI#: [phone]

## 2016-05-19 ENCOUNTER — Ambulatory Visit: Payer: Self-pay | Attending: Otolaryngology

## 2016-05-19 NOTE — Nursing Note (Signed)
Sutures removed per MD's request. The wound is well healed without signs and symptoms  of infection. Return prn. Stacyann Mcconaughy, [name] LVN

## 2016-07-08 DIAGNOSIS — Z131 Encounter for screening for diabetes mellitus: Secondary | ICD-10-CM | POA: Insufficient documentation

## 2016-07-08 DIAGNOSIS — E782 Mixed hyperlipidemia: Secondary | ICD-10-CM

## 2016-07-08 HISTORY — DX: Encounter for screening for diabetes mellitus: Z13.1

## 2016-07-08 HISTORY — DX: Mixed hyperlipidemia: E78.2

## 2016-07-20 ENCOUNTER — Other Ambulatory Visit: Payer: Self-pay | Admitting: Family Medicine

## 2016-07-20 DIAGNOSIS — R1013 Epigastric pain: Principal | ICD-10-CM

## 2016-07-21 NOTE — Telephone Encounter (Signed)
LAST LABS:  04/23/16  LAST OFFICE VISIT WITH PCP:  04/23/16    Evie Lacks, MA1

## 2016-09-02 ENCOUNTER — Encounter: Payer: Self-pay | Admitting: Otolaryngology

## 2016-09-02 ENCOUNTER — Ambulatory Visit: Payer: Self-pay | Attending: Otolaryngology | Admitting: Otolaryngology

## 2016-09-02 DIAGNOSIS — R238 Other skin changes: Secondary | ICD-10-CM

## 2016-09-02 DIAGNOSIS — H02833 Dermatochalasis of right eye, unspecified eyelid: Secondary | ICD-10-CM

## 2016-09-02 DIAGNOSIS — H02836 Dermatochalasis of left eye, unspecified eyelid: Secondary | ICD-10-CM

## 2016-09-02 DIAGNOSIS — H57819 Brow ptosis, unspecified: Secondary | ICD-10-CM

## 2016-09-02 NOTE — RN Handoff (Signed)
ID verified x 2(Rebecca Warner 04/30/1961). Patient escorted to exam room. Learning barriers assessed.  Carleene Overlie, [place]

## 2016-09-02 NOTE — Progress Notes (Addendum)
PATIENT: Rebecca Warner LOCATION: ENTCL6   MR #: [id number] SEX: female  AGE: 74yr  DATE OF SERVICE: 09/02/2016 DOB: Apr 14, 1961    OTOLARYNGOLOGY/FACIAL PLASTIC SURGERY CLINIC NOTE    CHIEF COMPLAINT:  Baggy eyes     HISTORY OF PRESENT ILLNESS:    Rebecca Warner is a 73yr year old female with history of bilateral blepharoplasty and dermatochalasis. She is most recently status post excision of excision skin of the left upper lateral eyelid on 05/13/16.     Anachristina has not had change in smell or taste, no fevers, chills, night sweats.  No rhinorrhea.   Terresa denies odynophagia, dysphagia or otalgia.    PAST MEDICAL HISTORY.    Past Medical History:   Diagnosis Date    Back pain        PAST SURGICAL HISTORY    Past Surgical History:   Procedure Laterality Date    BLEPHAROPLASTY Bilateral 05/01/2015    upper and lower    PR CESAREAN DELIVERY ONLY      C-section, low cervical x 2       ALLERGIES    Amoxicillin and Pcn (penicillin) [penicillins]    MEDICATIONS    Current Outpatient Prescriptions   Medication Sig    Aspirin, Buffered (BUFFERIN) 325 mg Tablet Tablet Take 650 mg by mouth two times daily if needed.    B Complex Vitamins (B-50 COMPLEX) Tablet Take  by mouth every morning.    Cholecalciferol, Vitamin D3, (VITAMIN D-3) 2,000 unit Tablet Take  by mouth.    Cyanocobalamin (VITAMIN B-12) 500 mcg Lozenge Take  by mouth.    DIPHENHYDRAMINE HCL (BENADRYL PO)     Hydrochlorothiazide (HYDRO-DIURIL) 25 mg Tablet TAKE 1 TABLET BY MOUTH EVERY MORNING.    Ketotifen Fumarate (ZADITOR) 0.025 % Ophthalmic Solution     LORATADINE PO     PROGESTERONE MISC Apply  to the affected area. Bid    Sucralfate (CARAFATE) 1 gram Tablet TAKE 1 TABLET BY MOUTH 4 TIMES DAILY. TAKE ON AN EMPTY STOMACH     No current facility-administered medications for this visit.        SOCIAL HISTORY:  Social History     Social History    Marital status: MARRIED     Spouse name: N/A    Number of children: 2    Years of education: N/A     Occupational  History    Airline pilot      Social History Main Topics    Smoking status: Never Smoker    Smokeless tobacco: Never Used    Alcohol use 4.2 oz/week     7 Glasses of wine per week      Comment: 1 glass at night    Drug use: No    Sexual activity: Not on file     Social History   Substance Use Topics    Smoking status: Never Smoker    Smokeless tobacco: Never Used    Alcohol use 4.2 oz/week     7 Glasses of wine per week      Comment: 1 glass at night       I did review patient's past medical and family/social history, no changes noted.     Physical examination:    The patient is a well-developed, well- nourished 55yr-old female in no acute distress.    Psychiatric:  Alert and oriented x3.  The patient has a normal affect.    Skin:  The patient shows no signs of skin irritation,  rash, or erythema.    HEENT exam:  Normocephalic, atraumatic.      Eyes:  Pupils equally round and reactive to light, extraocular movements are intact, conjunctiva and sclera are clear.    incisions at bleph sites are well healed very minimal asymmetry of upper lateral hooding    Nasal exam:  Nasal septum is midline and without lesions or erythema. No crusting or purulence is noted.      Oral cavity/oropharynx:  Mucous membranes without lesion, tongue midline, no lesions or masses    Ears:   Tympanic membranes were intact bilaterally with no obvious effusion or perforation. The External ear was without lesions or deformity    Neurologic exam:  Cranial nerves II-XII are grossly intact.  Facial sensation and motion are normal.  There is normal palatal motion.      Musculoskeletal: The trapezius and sternocleidomastoid muscles are equal bilaterally and the tongue is midline.      The neck is without lymphadenopathy or thyromegaly     Assessment:: Rebecca Warner is a 58yr year old female with     1. Dermatochalasis of both eyelids    2. Brow ptosis    3. Facial aging         RECOMMENDATIONS:   We discussed the indications for  surgery/treatment.The patient has agreed to the surgery/recommended treatment.    Counselling and discussion with patient included   1)probable  Diagnosis,  2) probable  treatment options, and    3) recommendations for management with the following:    No future appointments made yet    plan for endoscopic browlift as a treatment for her concern over the left >right brow ptosis and lateral eyelid hooding  use of Botulinum toxin reviewed in detail.   risks of ptosis, asymmetry, poor cosmetic outcome bruising, need for future treatments discussed, alternatives and benefits reviewed and she expressed understanding.       Barriers to Learning assessed: none. Patient verbalizes understanding of teaching and instructions.    SCRIBE DISCLAIMER:  This note was scribed on 09/02/2016 by Jodi Marble, a trained medical scribe, in the presence of Dr. Hinda Kehr.    Electronically signed by:  Jodi Marble, [place]   09/02/2016   1:13 PM      PROVIDER DISCLAIMER:  This document serves as my personal record of services that I personally performed and was taken in my presence. It was created on 09/02/2016 on my behalf by Jodi Marble, a trained medical scribe.    I have reviewed this document and agree that this note accurately and completely reflects the history and exam findings, the patient care provided, and my medical decision making.     No future appointments.    Electronical Signed :

## 2016-11-26 ENCOUNTER — Encounter: Payer: Self-pay | Admitting: Family Medicine

## 2016-11-26 ENCOUNTER — Ambulatory Visit: Payer: BC Managed Care – PPO | Admitting: Family Medicine

## 2016-11-26 VITALS — BP 166/95 | HR 62 | Temp 98.6°F | Resp 18 | Wt 131.6 lb

## 2016-11-26 DIAGNOSIS — L659 Nonscarring hair loss, unspecified: Secondary | ICD-10-CM

## 2016-11-26 DIAGNOSIS — M5136 Other intervertebral disc degeneration, lumbar region: Secondary | ICD-10-CM

## 2016-11-26 DIAGNOSIS — M79671 Pain in right foot: Principal | ICD-10-CM

## 2016-11-26 DIAGNOSIS — M545 Low back pain: Secondary | ICD-10-CM

## 2016-11-26 DIAGNOSIS — I10 Essential (primary) hypertension: Secondary | ICD-10-CM

## 2016-11-26 MED ORDER — SPIRONOLACTONE 25 MG TABLET: ORAL_TABLET | ORAL | 1 refills | Status: DC

## 2016-11-26 NOTE — Patient Instructions (Signed)
A referral has been placed to Dr. Romilda Garret, podiatrist.  Her office should call you to schedule an appointment within 10 business days.  If you do not receive a phone call, please call her office at [phone].     X-RAY IS LOCATED ON THE FIRST FLOOR IN [address], CHECK IN AT THE LAB.  X-RAY HOURS ARE Monday-Friday 8:00 A.M. - 11:45 A.M. & 1:00 P.M. - 4:45 P.M.  IF YOU ARE HAVING X-RAYS TAKEN ON A FUTURE DATE, IT IS RECOMMENDED THAT YOU CALL AND VERIFY X-RAY IS OPEN, [id number] OPTION 2

## 2016-11-26 NOTE — Nursing Note (Signed)
Vitals taken, allergies verified, screened for pain. Tiago Humphrey, MA1

## 2016-11-27 ENCOUNTER — Ambulatory Visit
Admission: RE | Admit: 2016-11-27 | Discharge: 2016-12-02 | Disposition: A | Discharge status: Home / Self Care | Payer: BC Managed Care – PPO | Source: Ambulatory Visit | Attending: DIAGNOSTIC RADIOLOGY | Admitting: DIAGNOSTIC RADIOLOGY

## 2016-11-27 DIAGNOSIS — M79671 Pain in right foot: Principal | ICD-10-CM | POA: Insufficient documentation

## 2016-11-28 ENCOUNTER — Telehealth: Payer: Self-pay | Admitting: Family Medicine

## 2016-11-28 NOTE — Telephone Encounter (Signed)
Please call patient - x-ray of the foot is normal  Letter also sent

## 2016-11-28 NOTE — Progress Notes (Signed)
ID/CC:Rebecca Warner is a 56yr old female here for right foot pain and back pain.  Patient arrives 19min late for her appt    S: onset 5 days ago.  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  Afterwards went for a walk.  Stopped to talk to her neighbor and afterwards had severe right foot pain and was unable to ambulate.  Has also noted increased LBP.  Has history of DJD/DDD.  Does not know if they are related.  They occur separately.    Has also been having increased alopecia.  Has been using Rogaine.  wa seen by Dr. Carlis Abbott for skin check and she mentioned could do a trial of spironolactone to see if would help.  Referring to visit in July no mention of dosage.  Patient would like a trial and to use for BP control rather than HCTZ.    Allergies:   Allergies   Allergen Reactions    Amoxicillin Hives    Pcn (Penicillin) [Penicillins] Rash       Meds: Reviewed and updated in EPIC    PMH: PNA, Rosacea, severe cervical DJD    PSHx: C/s x2 , uterine polyp resection    OB/GYN:  G3P2; SAB1; historically normal paps, last done 2014 with cotesting, last mammo 7/17 - normal; husband with vasectomy    FAm History:  M- thyroid, HTN; F-healthy ; Bx1 - healthy; MGM - colon [place] 80s; PGF - MI    Soc History: Married, 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Surveyor, mining ; Pharmacist, hospital in history department; nonsmoker; etoh -  light reg; exercise regularly      REVIEW OF SYSTEMS:neg other than subjective    OBJECTIVE:  BP (!) 166/95  Pulse 62  Temp 37 C (98.6 F) (Temporal)  Resp 18  Wt 59.7 kg (131 lb 9.8 oz)  BMI 22.59 kg/m2  GEN: antalgic gait, Nondyspneic, Nonpallor, No juandice,  Right foot: + tendenress and pain between 2-3rd metatarsals.  + pain with compression to metatarsals  Musculosk:  Palpation:+ tenderness T-L junction with prominance of the T12 sp process and location of patient pain   Sitted SLR unremarkable  ;Supine SLR 60 degrees , limited by LBP,  Reflexes L4 /S1-  +2, Lt touch Intact and normal, Strength 5/5 L4-S1      ASSESSMENT AND PLAN:  1. Right foot pain- unclear etio- suspect mortons neuroma vs stress fracture-- x-ray ordered.  Referral done to Dr. Romilda Garret.  Advised active rest.  2. LBP - suspect facet irritation.  X-ray ordered. also advised active rest. will f/u if not improving  3. Alopecia and HTN - patient requesting trial of spironolactone prescription sent.  Advised chem 7 after 2-4 weeks on med.  Is to take home BP log    I did not review patient's past medical and family/social history.    Barriers to Learning assessed: none. Patient verbalizes understanding of teaching and instructions.    Electronically signed by  Grace Isaac. [facility]  [address] [address]  [phone]                    ASSESSMENT AND PLAN:  1. Annual exam - no pap indicated until  2019; mammo- 7/17. FIT ordered for colon CA screening.  Patient declines colonoscopy due to concerns about proper sterilization  2. Right breast mastitis - [place] short interval looks  resolved.  Has repeat mammo in 4 months to recheck architectural change in right breast.  Just done - benign and normal findings  3. RLQ pain with bloating - resolved with elimination diet of yogurt with nuts.  But patient requesting trial of Carafate as still gets occasional pain.  I suspect a 12th rib dysfunction but short trial of carafate is ok  4.  Need Tdap     I did review patient's past medical and family/social history, no changes noted.    Barriers to Learning assessed: none. Patient verbalizes understanding of teaching and instructions.    Electronically signed by  Grace Isaac. Alexya Mcdaris, [facility]  [address], [place] [address]  [phone]

## 2016-12-02 NOTE — Telephone Encounter (Signed)
Spoke with patient she was advised of [place], DO orders/instructionas below.  Patient understands what was advised.   Bradd Canary, MA

## 2016-12-19 ENCOUNTER — Ambulatory Visit: Payer: BC Managed Care – PPO | Admitting: SPORTS MEDICINE

## 2017-01-16 ENCOUNTER — Ambulatory Visit: Payer: BC Managed Care – PPO | Admitting: SPORTS MEDICINE

## 2017-01-16 DIAGNOSIS — M7711 Lateral epicondylitis, right elbow: Secondary | ICD-10-CM | POA: Insufficient documentation

## 2017-01-16 DIAGNOSIS — M7061 Trochanteric bursitis, right hip: Secondary | ICD-10-CM | POA: Insufficient documentation

## 2017-01-16 NOTE — Progress Notes (Signed)
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 Was no acute injury or trauma for either condition. The pain in the elbow is exacerbated by wrist extension. The pain in her right lateral hip is exacerbated by IT band stretch. She denies any acute or recent trauma to either area. She denies any neurogenic pain to either area.    On physical examination her right elbow reveals full range of motion with tenderness over the lateral epicondyles. Pain is exacerbated by wrist extension and supination. Normal motor and sensory testing of radius, ulnar and median nerves.    Right hip reveals no tenderness to logrolling maneuver. Negative straight leg raise and Patrick's test. Tenderness to direct palpation over the greater trochanter. Slight pain to direct palpation over IT band. No pain to resistance against hip flexion, abduction or adduction.    Assessment: Right elbow lateral epicondylitis, right hip trochanteric bursitis.    Plan: We discussed various treatment options and she would prefer stretching exercises for both areas. I recommended an eccentric wrist extension exercise for her tennis elbow. I recommended 2 different IT band stretches which were demonstrated which is basically a leg crossover stretch. She will return to clinic for follow-up on an as-needed basis.

## 2017-01-16 NOTE — Nursing Note (Signed)
Allergies verified, screened for pain, DOB verified

## 2017-08-27 ENCOUNTER — Encounter: Payer: Self-pay | Admitting: Rheumatology

## 2017-08-27 ENCOUNTER — Ambulatory Visit: Payer: BC Managed Care – PPO | Admitting: Rheumatology

## 2017-08-27 VITALS — BP 120/72 | HR 77 | Temp 97.8°F | Ht 63.25 in | Wt 133.6 lb

## 2017-08-27 DIAGNOSIS — J301 Allergic rhinitis due to pollen: Secondary | ICD-10-CM

## 2017-08-27 DIAGNOSIS — J45991 Cough variant asthma: Secondary | ICD-10-CM

## 2017-08-27 DIAGNOSIS — H1010 Acute atopic conjunctivitis, unspecified eye: Secondary | ICD-10-CM

## 2017-08-27 MED ORDER — FLUTICASONE PROPIONATE 50 MCG/ACTUATION NASAL SPRAY,SUSPENSION: 1.0000 | Freq: Every day | NASAL | 5 refills | Status: DC

## 2017-08-27 MED ORDER — AZELASTINE 137 MCG (0.1 %) NASAL SPRAY: 2.0000 | Freq: Two times a day (BID) | NASAL | 5 refills | Status: DC

## 2017-08-27 MED ORDER — ALBUTEROL SULFATE HFA 90 MCG/ACTUATION AEROSOL INHALER
1.0000 | INHALATION_SPRAY | RESPIRATORY_TRACT | 3 refills | Status: DC | PRN
Start: 2017-08-27 — End: 2018-12-31

## 2017-08-27 NOTE — Nursing Note (Signed)
Spirometry performed.  Kynzlee Hucker C Sparkles Mcneely, RN
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
## 2017-08-27 NOTE — Nursing Note (Signed)
Vital signs taken, allergies verified, and screened for pain.    Minnette Merida, MA

## 2017-10-27 ENCOUNTER — Ambulatory Visit: Payer: BC Managed Care – PPO | Admitting: Rheumatology

## 2017-10-27 ENCOUNTER — Encounter: Payer: Self-pay | Admitting: Rheumatology

## 2017-10-27 VITALS — BP 138/80 | HR 75 | Temp 98.1°F | Resp 16 | Wt 135.8 lb

## 2017-10-27 DIAGNOSIS — J45991 Cough variant asthma: Secondary | ICD-10-CM

## 2017-10-27 DIAGNOSIS — H1010 Acute atopic conjunctivitis, unspecified eye: Secondary | ICD-10-CM

## 2017-10-27 DIAGNOSIS — J301 Allergic rhinitis due to pollen: Secondary | ICD-10-CM

## 2017-10-27 NOTE — Progress Notes (Signed)
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    Assessment/plan: Allergic rhinoconjunctivitis, cough variant asthma.  A letter regarding her condition and need for accommodation was dictated.  She will continue her albuterol as needed, fluticasone and Azelastin nasal sprays, ketotifen ophthalmic as needed.  She will return in 6 months for follow-up.

## 2017-10-27 NOTE — Nursing Note (Signed)
vital signs taken, allergies verified,screened for pain,and medication history taken.  Michiah Masse, MAII

## 2017-10-29 ENCOUNTER — Encounter: Payer: Self-pay | Admitting: Rheumatology

## 2017-10-29 NOTE — Telephone Encounter (Signed)
Patient form ready to pick up, LVM for patient advising paperwork at reception.

## 2018-03-19 ENCOUNTER — Ambulatory Visit: Admit: 2018-03-19 | Discharge: 2018-03-19 | Payer: BLUE CROSS/BLUE SHIELD

## 2018-03-19 ENCOUNTER — Ambulatory Visit: Admit: 2018-03-19 | Discharge: 2018-03-20 | Payer: BLUE CROSS/BLUE SHIELD

## 2018-03-26 ENCOUNTER — Telehealth: Payer: Self-pay | Admitting: Internal Medicine

## 2018-03-26 DIAGNOSIS — M5412 Radiculopathy, cervical region: Secondary | ICD-10-CM
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
## 2018-03-26 NOTE — Telephone Encounter (Signed)
Save for [place].  Bradd Canary  MA I

## 2018-03-29 ENCOUNTER — Ambulatory Visit: Payer: BC Managed Care – PPO | Admitting: Internal Medicine

## 2018-03-29 ENCOUNTER — Ambulatory Visit: Payer: BC Managed Care – PPO | Admitting: FAMILY PRACTICE

## 2018-03-29 ENCOUNTER — Encounter: Payer: Self-pay | Admitting: Internal Medicine

## 2018-03-29 VITALS — BP 154/90 | Temp 99.3°F | Wt 135.6 lb

## 2018-03-29 DIAGNOSIS — M5412 Radiculopathy, cervical region: Secondary | ICD-10-CM

## 2018-03-29 DIAGNOSIS — M5116 Intervertebral disc disorders with radiculopathy, lumbar region: Secondary | ICD-10-CM

## 2018-03-29 DIAGNOSIS — R030 Elevated blood-pressure reading, without diagnosis of hypertension: Secondary | ICD-10-CM

## 2018-03-29 MED ORDER — SPIRONOLACTONE 25 MG TABLET: ORAL_TABLET | ORAL | 0 refills | Status: DC

## 2018-03-29 MED ORDER — LIDOCAINE 5 % TOPICAL PATCH: 1.0000 | MEDICATED_PATCH | TOPICAL | 3 refills | Status: DC

## 2018-03-29 MED ORDER — CYCLOBENZAPRINE 10 MG TABLET: 10.0000 mg | ORAL_TABLET | Freq: Three times a day (TID) | ORAL | 0 refills | Status: DC | PRN

## 2018-03-29 NOTE — Patient Instructions (Signed)
PENDING MRI REPORT  NEUROSURGERY REFERRAL   REFILLS SENT   HEAT /ICE ALTERNATE

## 2018-03-29 NOTE — Telephone Encounter (Signed)
ED records requested from [facility], Medical Records via fax: [phone].    [place], [place]
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
## 2018-03-29 NOTE — Nursing Note (Signed)
Vital signs taken, allergies verified, and screened for pain.    Teosha Casso, MA

## 2018-03-30 NOTE — Telephone Encounter (Addendum)
Spoke to patient and relayed advised note from MD. Patient aware and understood. Patient told to contact [facility] directly to obtain CD of images done.    Patient complained of having fast heart rate and sweating when using the lidocaine patch. Per patient, she removed the patch immediately.    Per Dr. Loura Back, symptoms noted are not usually related to the patch, may be anxiety related. Per Dr. Loura Back, have patient try patch at a later time, but if symptom continues, to discontinue use.     Patient was placed on hold, but hung up. Will call patient shortly.     Copy of records for urgent scanning.     [place], [place]

## 2018-03-30 NOTE — Addendum Note (Signed)
Addended by: Vita Erm on: 03/30/2018 03:44 PM     Modules accepted: Orders

## 2018-03-30 NOTE — Telephone Encounter (Signed)
Able to reach patient and relayed advised note from MD. Patient aware and understood.    [place], [place]

## 2018-03-30 NOTE — Telephone Encounter (Signed)
Records received, and placed in Dr.Kahlon'ws box for review.

## 2018-03-30 NOTE — Telephone Encounter (Addendum)
Attempt made to call patient. Busy dial tone.    Message forwarded to Triage per MD's request.    Audelia Acton, MA

## 2018-03-30 NOTE — Telephone Encounter (Signed)
Please let patient know that I have reviewed the records from [facility] and have put in the referral urgently to neurosurgery clinic for her to be evaluated as soon as possible.  I would advise that she get images from [facility] and take the images to her neurosurgery appointment to get the best care possible.  We cannot request images from [facility].

## 2018-04-14 ENCOUNTER — Other Ambulatory Visit: Payer: Self-pay | Admitting: Nurse Practitioner

## 2018-04-14 DIAGNOSIS — M5412 Radiculopathy, cervical region: Principal | ICD-10-CM

## 2018-04-26 ENCOUNTER — Ambulatory Visit
Admission: RE | Admit: 2018-04-26 | Discharge: 2018-04-26 | Disposition: A | Discharge status: Home / Self Care | Payer: BC Managed Care – PPO | Source: Ambulatory Visit | Attending: Neurological Surgery | Admitting: Neurological Surgery

## 2018-04-26 ENCOUNTER — Encounter: Payer: Self-pay | Admitting: Neurological Surgery

## 2018-04-26 ENCOUNTER — Ambulatory Visit (HOSPITAL_BASED_OUTPATIENT_CLINIC_OR_DEPARTMENT_OTHER): Payer: BC Managed Care – PPO | Admitting: Neurological Surgery

## 2018-04-26 ENCOUNTER — Ambulatory Visit: Admit: 2018-04-26 | Discharge: 2018-04-27 | Payer: BLUE CROSS/BLUE SHIELD

## 2018-04-26 DIAGNOSIS — R2689 Other abnormalities of gait and mobility: Secondary | ICD-10-CM

## 2018-04-26 DIAGNOSIS — M5412 Radiculopathy, cervical region: Principal | ICD-10-CM | POA: Insufficient documentation

## 2018-04-26 DIAGNOSIS — M546 Pain in thoracic spine: Secondary | ICD-10-CM

## 2018-04-26 DIAGNOSIS — M5416 Radiculopathy, lumbar region: Secondary | ICD-10-CM

## 2018-04-26 DIAGNOSIS — M50323 Other cervical disc degeneration at C6-C7 level: Secondary | ICD-10-CM

## 2018-04-26 DIAGNOSIS — M6281 Muscle weakness (generalized): Secondary | ICD-10-CM

## 2018-04-26 DIAGNOSIS — M50322 Other cervical disc degeneration at C5-C6 level: Secondary | ICD-10-CM

## 2018-04-26 DIAGNOSIS — S14109A Unspecified injury at unspecified level of cervical spinal cord, initial encounter: Secondary | ICD-10-CM

## 2018-04-26 DIAGNOSIS — M5030 Other cervical disc degeneration, unspecified cervical region: Principal | ICD-10-CM

## 2018-04-26 DIAGNOSIS — M545 Low back pain: Secondary | ICD-10-CM

## 2018-04-26 DIAGNOSIS — R292 Abnormal reflex: Secondary | ICD-10-CM
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
## 2018-04-26 NOTE — Nursing Note (Signed)
Patient wants to take vitals after her visit.    Relayed message to coworkers, and patient herself to stop at nurses station to get her BP done before she leaves.

## 2018-04-28 NOTE — Progress Notes (Signed)
I personally saw the patient and reviewed all relevant history and pysical examination with Vilija Abrute, NP.  I went over the imaging studies with the patient.  Based on the history including any treatment already tried and examination with relevant findings on the images, I have come with the recommended treatment plan.

## 2018-04-29 ENCOUNTER — Telehealth: Payer: Self-pay | Admitting: Neurological Surgery

## 2018-04-29 DIAGNOSIS — M5116 Intervertebral disc disorders with radiculopathy, lumbar region: Principal | ICD-10-CM

## 2018-04-29 NOTE — Telephone Encounter (Signed)
I changed MRI orders to urgent.     Activity as tolerated per patient. However, no lifting or carrying more than 10-lbs. No overhead lifting.     Playing with children is ok, and doing house chores are ok as tolerated. Avoid activities such as jumping or running.

## 2018-04-29 NOTE — Telephone Encounter (Signed)
Patient just called back to inform me that radiology' s earliest availability isn't until after her f/u with Maudie Mercury on 05/17/2018.  Please advise as to whether or not the patient can have her imaging done elsewhere or mark the referral to radiology as urgent. Thank you .

## 2018-04-29 NOTE — Telephone Encounter (Signed)
S/w patient verified name, DOB, MRN and/or address at initiation of call.    Your provider:Kim      In your own words what is the chief concern you would like addressed by the doctor today: Patient said that Maudie Mercury had found a "ding" in the spinal cord; and would like to know if spinal necrosis is a possibility for the injury and whether or not a follow up cervical MRI would be recommended at this time. And would also like to know what her physical limitations are because of this injury. Patient has two small children and would like to know if small household chores or playing with her children is permitted per the injury. Is there anything she should not do?       What is a good contact number: [phone]     If you are not available, is it ok to leave a detailed message on your voicemail: Yes       Telephone calls are triaged; Urgent issues are triaged first then in the order received. Our goal is to contact every patient within 24 hours of the initial call; however we have up to 48 hours for routine calls and up to 72 hours for medication refills.  If YOU feel it's an EMERGENCY and you don't think you can wait for a response, then hang up and go to your nearest EMERGENCY room or dial 911.

## 2018-05-03 NOTE — Telephone Encounter (Signed)
MRI is now pending auth

## 2018-05-04 ENCOUNTER — Telehealth: Payer: Self-pay | Admitting: Physician Assistant

## 2018-05-04 NOTE — Telephone Encounter (Signed)
Peer to Peer review done; talked to Dr Norma Fredrickson    Member ID [place] [id number]    [facility] PH# [phone]     Authorized Mri T and L spine without contrast  To be done at [facility] before 06/01/2018    AUTH # [id number]    Thank you     Lincoln Maxin PA-C  Neurological Surgery Department.

## 2018-05-05 ENCOUNTER — Ambulatory Visit
Admission: RE | Admit: 2018-05-05 | Discharge: 2018-05-05 | Disposition: A | Discharge status: Home / Self Care | Payer: BC Managed Care – PPO | Source: Ambulatory Visit | Attending: DIAGNOSTIC RADIOLOGY | Admitting: DIAGNOSTIC RADIOLOGY

## 2018-05-05 ENCOUNTER — Ambulatory Visit: Admit: 2018-05-05 | Discharge: 2018-05-05 | Payer: BLUE CROSS/BLUE SHIELD

## 2018-05-05 ENCOUNTER — Ambulatory Visit: Admit: 2018-05-05 | Discharge: 2018-05-06 | Payer: BLUE CROSS/BLUE SHIELD

## 2018-05-05 DIAGNOSIS — M2578 Osteophyte, vertebrae: Principal | ICD-10-CM | POA: Insufficient documentation

## 2018-05-05 DIAGNOSIS — M5136 Other intervertebral disc degeneration, lumbar region: Principal | ICD-10-CM | POA: Insufficient documentation

## 2018-05-05 DIAGNOSIS — M5116 Intervertebral disc disorders with radiculopathy, lumbar region: Secondary | ICD-10-CM

## 2018-05-05 DIAGNOSIS — M438X2 Other specified deforming dorsopathies, cervical region: Secondary | ICD-10-CM | POA: Insufficient documentation

## 2018-05-05 NOTE — Telephone Encounter (Signed)
Pt is scheduled for today  °

## 2018-05-17 ENCOUNTER — Ambulatory Visit: Payer: BC Managed Care – PPO | Attending: Neurological Surgery | Admitting: Neurological Surgery

## 2018-05-17 ENCOUNTER — Encounter: Payer: Self-pay | Admitting: Neurological Surgery

## 2018-05-17 VITALS — BP 132/84 | HR 72 | Temp 99.4°F | Resp 12 | Ht 63.75 in | Wt 136.7 lb

## 2018-05-17 DIAGNOSIS — M6281 Muscle weakness (generalized): Secondary | ICD-10-CM | POA: Insufficient documentation

## 2018-05-17 DIAGNOSIS — M5412 Radiculopathy, cervical region: Secondary | ICD-10-CM | POA: Insufficient documentation

## 2018-05-17 DIAGNOSIS — S134XXS Sprain of ligaments of cervical spine, sequela: Secondary | ICD-10-CM | POA: Insufficient documentation

## 2018-05-17 DIAGNOSIS — M79602 Pain in left arm: Secondary | ICD-10-CM | POA: Insufficient documentation

## 2018-05-17 DIAGNOSIS — M542 Cervicalgia: Principal | ICD-10-CM | POA: Insufficient documentation

## 2018-05-17 DIAGNOSIS — R2689 Other abnormalities of gait and mobility: Secondary | ICD-10-CM | POA: Insufficient documentation

## 2018-05-17 DIAGNOSIS — R278 Other lack of coordination: Secondary | ICD-10-CM | POA: Insufficient documentation

## 2018-05-17 DIAGNOSIS — R292 Abnormal reflex: Secondary | ICD-10-CM | POA: Insufficient documentation

## 2018-05-17 NOTE — Patient Instructions (Addendum)
Continue monitoring strength, start some gentle physical therapy. Referral placed to physical therapy, please call [phone] to schedule.    Discuss with PCP to manage any medical leave or disability if not having surgery.     Contact us if you'd like to proceed with surgery, otherwise f/u 6 months
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
## 2018-05-17 NOTE — Nursing Note (Signed)
Identified patient using name and date of birth.  Vital signs were taken.  Screened for pain.  Allergies verified.  Pharmacy was updated.   Rebecca Warner ([name]) [name], MA I

## 2018-05-20 ENCOUNTER — Ambulatory Visit: Admit: 2018-05-20 | Discharge: 2018-05-20 | Payer: BLUE CROSS/BLUE SHIELD

## 2018-05-20 DIAGNOSIS — M5030 Other cervical disc degeneration, unspecified cervical region: Secondary | ICD-10-CM

## 2018-05-20 DIAGNOSIS — M542 Cervicalgia: Secondary | ICD-10-CM

## 2018-05-20 DIAGNOSIS — M4722 Other spondylosis with radiculopathy, cervical region: Secondary | ICD-10-CM

## 2018-05-20 DIAGNOSIS — M4802 Spinal stenosis, cervical region: Secondary | ICD-10-CM

## 2018-05-20 MED ORDER — CYANOCOBALAMIN (VIT B-12) 500 MCG TABLET
500 | ORAL | Status: DC
Start: 2018-05-20 — End: 2018-07-20

## 2018-05-20 MED ORDER — CLARITIN ORAL
ORAL | Status: AC | PRN
Start: 2018-05-20 — End: ?

## 2018-05-20 MED ORDER — ASPIRIN,BUFFERED (CALCIUM CARBONATE-MAGNESIUM) 325 MG TABLET
325 | ORAL | Status: DC | PRN
Start: 2018-05-20 — End: 2018-07-22

## 2018-05-20 NOTE — Patient Instructions (Signed)
Preparing for Surgery at [facility]                                Your surgeon has recommended that you have an operation.  The following instructions are designed to take you step-by-step through the process and to answer some frequently asked questions.    When am I having surgery and where do I go?  Your surgeon's office will provide you with the location, date, and time for your operation.  If you do not hear from your surgeon's office and want to check on the status of your upcoming procedure, please call the surgeon's office and ask to speak with his/her practice assistant.     Will I meet my anesthesiologist before surgery?  You will not meet the anesthesiologist assigned to take care of you until the day of surgery.  However, you will be assessed by one of [facility] nurse practitioners prior to your operation.  For some patients, this assessment will take place over the phone.  For other patients, an in-person evaluation will be required in our [facility] clinic.      What do I need to do now?    You will receive a call from [facility] 7-10 days before your surgery to set up either a phone consult or in-person [facility] clinic appointment.  If you do not receive a call from the PREPARE clinic, please call your surgeon's office to confirm that your operation has been scheduled and the referral to [facility] made.      Do I need to have any tests done prior to surgery?  Your surgeon may require laboratory or other diagnostics tests prior to surgery.  These are printed in your After Visit Summary from your surgery clinic visit, and your surgeon may have also given you printed requisition forms.    If you are seen in-person in the [facility] Clinic, your tests will be performed during your [facility] appointment.    If you are evaluated by phone, a member of the [facility] team will give you explicit instructions on when and where to have these studies done.      For many operations, laboratory tests, EKGs  and chest x-rays are not needed.      If you are a Jehovah's Witness, you must notify your surgeon in advance of your procedure. In addition, you MUST discuss your desires regarding transfusion of blood products with your surgeon or the Nurse Practitioner from the [facility] Clinic.  If you have a bleeding disorder such as von Willebrand's disease or hemophilia, notify your surgeon in advance as special arrangements may need to be made in preparation for your surgery (for example, referral to a hematologist).    Will [facility] review my health information from other places?  In order to plan for a smooth operation and recovery, we frequently need to review records from your other doctors.  If you have had any of the studies listed below, please arrange to have the reports faxed to the [facility] clinic.  Delays in obtaining these records may lead to a delay in your upcoming procedure.    Please Fax the following records to [facility] at [phone]:  (If any of the following were done at , you do not need to provide these records)  1.  Recent notes from your primary care provider  2.  Recent blood work (within 6 months)  3.  Stress test  4.    Echocardiogram (Echo)   5.  Electrocardiogram (EKG)  6   Cardiac catheterization   7   Pacemaker or ICD (Implantable cardioverter-defibrillator)   8.  Clinic notes from any specialist who has evaluated you in the past 2 years (for example a cardiologist, pulmonologist, hematologist)    Which medications should I stop or start prior to surgery?  Our [facility] nurse practitioner will give you detailed instructions on how to manage your medications prior to surgery.     Where do I go on the day of surgery?  And when?  [facility] will give you instructions on where to go on the day of surgery.  Your surgeon's office will tell you when to arrive on the day of surgery.      Where is the PREPARE clinic?  There are two [facility]ARE clinics, one at [place] and one at [place].  If you are coming in  for an in-person appointment, [facility] will tell you which clinic to go to.  Directions are below:    Directions to [facility] [facility]  The [place] PREPARE clinic is located on the first floor of [facility], [address], [address], in [place].  Public parking at [facility] is available in the [place] [address] at [address]   [facility] is accessible via [employer] streetcar line [address], which stops at [address] and [address], and the following [employer] bus lines, which stop in front of the hospital: [address], [address], [address]    Directions to [place] PRE[facility]  The [place] PRE[facility]ARE clinic is located on the third floor of the [facility], [address], Reception [address] in [place]. Public parking for [place] is available in the parking garage at [address].  For public transportation the [facility] is served by the [employer] [address] line, which stops at the [place] [facility] located on [address] near [address]. A new 55 [employer] line will provide service from the [address] BART station to the [place] hospitals.  For more information on Parking and transportation please call [phone]    Does my surgeon have any special instructions for me?  If your surgeon has specific instructions, they are listed below.
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
## 2018-05-24 NOTE — Progress Notes (Signed)
Surgery is an option but she still uncomfortable about proceeding.  Will not push her but let her take the lead on timing of the surgery.  I personally saw the patient and reviewed all relevant history and pysical examination with Ivin Poot, FNP-C.  I went over the imaging studies with the patient.  Based on the history including any treatment already tried and examination with relevant findings on the images, I have come with the recommended treatment plan.

## 2018-05-27 DIAGNOSIS — M4712 Other spondylosis with myelopathy, cervical region: Secondary | ICD-10-CM

## 2018-05-28 ENCOUNTER — Ambulatory Visit (INDEPENDENT_AMBULATORY_CARE_PROVIDER_SITE_OTHER): Payer: BC Managed Care – PPO

## 2018-05-28 ENCOUNTER — Encounter: Payer: Self-pay | Admitting: Family Medicine

## 2018-05-28 ENCOUNTER — Ambulatory Visit: Payer: BC Managed Care – PPO | Attending: Family Medicine | Admitting: Family Medicine

## 2018-05-28 VITALS — BP 158/82 | Wt 136.9 lb

## 2018-05-28 DIAGNOSIS — Z23 Encounter for immunization: Principal | ICD-10-CM | POA: Insufficient documentation

## 2018-05-28 DIAGNOSIS — M5030 Other cervical disc degeneration, unspecified cervical region: Secondary | ICD-10-CM | POA: Insufficient documentation

## 2018-05-28 LAB — HEPATITIS B SURFACE ANTIBODY: HEPATITIS B SURFACE AB: NONREACTIVE

## 2018-05-28 LAB — HEPATITIS B SURFACE ANTIGEN: HEPATITIS B SURFACE ANTIGEN: NONREACTIVE
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
## 2018-05-28 NOTE — Patient Instructions (Signed)
A referral has been ordered for physical therapy.  You should receive a call within 4 business days from physical therapy.  If you do not receive a call, please call our Centralized Referral Unit at [phone] for assistance.    Non-fasting labs have been ordered.  Our lab is open for walk-ins Monday - Friday from 7:30am - 12:00pm and 12:30pm - 5:00pm.  Wait times may vary.

## 2018-05-28 NOTE — Nursing Note (Signed)
Patient declined 2nd BP. Rebecca Warner, MA1

## 2018-05-31 ENCOUNTER — Telehealth: Payer: Self-pay | Admitting: Family Medicine

## 2018-05-31 NOTE — Telephone Encounter (Signed)
Please call patient.  She does not have Hep B immunity.  If she would like the vaccine it has already been ordered for her and please assist her with a nursing appt

## 2018-05-31 NOTE — Telephone Encounter (Signed)
Patient notified, will call back to schedule a nurse visit. Evie Lacks, MA1

## 2018-06-11 NOTE — Telephone Encounter (Signed)
Called pt in reponse to email inquiry about MyChart portal activation. Provided pt w MRN to assist w setting up account. Pt also requested to set up a time w Dr. Jodie Echevariaan to review some questions she has about her upcoming surgery. Encouraged pt to send her questions via MyChart portal in advance.

## 2018-06-15 NOTE — Progress Notes (Deleted)
I called the patient and discussed her upcoming surgery. All questions were answered to her satisfaction.    Tammi Klippel, MD  Neurosurgery Attending

## 2018-06-29 ENCOUNTER — Other Ambulatory Visit: Payer: Self-pay | Admitting: Obstetrics and Gynecology

## 2018-06-29 DIAGNOSIS — N6310 Unspecified lump in the right breast, unspecified quadrant: Secondary | ICD-10-CM

## 2018-07-02 ENCOUNTER — Other Ambulatory Visit: Payer: Self-pay | Admitting: Obstetrics and Gynecology

## 2018-07-02 ENCOUNTER — Ambulatory Visit
Admission: RE | Admit: 2018-07-02 | Discharge: 2018-07-02 | Disposition: A | Discharge status: Home / Self Care | Payer: BC Managed Care – PPO | Source: Ambulatory Visit | Attending: Obstetrics and Gynecology | Admitting: Obstetrics and Gynecology

## 2018-07-02 ENCOUNTER — Ambulatory Visit: Payer: Self-pay

## 2018-07-02 ENCOUNTER — Encounter (INDEPENDENT_AMBULATORY_CARE_PROVIDER_SITE_OTHER): Payer: Self-pay

## 2018-07-02 DIAGNOSIS — N6310 Unspecified lump in the right breast, unspecified quadrant: Secondary | ICD-10-CM

## 2018-07-02 DIAGNOSIS — R928 Other abnormal and inconclusive findings on diagnostic imaging of breast: Secondary | ICD-10-CM

## 2018-07-05 ENCOUNTER — Ambulatory Visit: Payer: BC Managed Care – PPO | Attending: Neurological Surgery

## 2018-07-05 ENCOUNTER — Ambulatory Visit (INDEPENDENT_AMBULATORY_CARE_PROVIDER_SITE_OTHER): Payer: BC Managed Care – PPO

## 2018-07-05 DIAGNOSIS — M542 Cervicalgia: Principal | ICD-10-CM | POA: Insufficient documentation

## 2018-07-05 DIAGNOSIS — M5030 Other cervical disc degeneration, unspecified cervical region: Secondary | ICD-10-CM | POA: Insufficient documentation

## 2018-07-05 DIAGNOSIS — Z23 Encounter for immunization: Secondary | ICD-10-CM | POA: Insufficient documentation

## 2018-07-05 LAB — CBC WITH DIFFERENTIAL
BASOPHILS % AUTO: 0.5 %
BASOPHILS ABS AUTO: 0 10*3/uL (ref 0.0–0.2)
EOSINOPHIL % AUTO: 0.8 %
EOSINOPHIL ABS AUTO: 0.1 10*3/uL (ref 0.0–0.5)
HEMATOCRIT: 41.4 % (ref 36.0–46.0)
HEMOGLOBIN: 14.1 g/dL (ref 12.0–16.0)
LYMPHOCYTE ABS AUTO: 1.5 10*3/uL (ref 1.0–4.8)
LYMPHOCYTES % AUTO: 22.6 %
MCH: 29.6 pg (ref 27.0–33.0)
MCHC: 34.1 % (ref 32.0–36.0)
MCV: 87 fL (ref 80.0–100.0)
MONOCYTES % AUTO: 8 %
MONOCYTES ABS AUTO: 0.5 10*3/uL (ref 0.1–0.8)
MPV: 9 fL (ref 6.8–10.0)
NEUTROPHIL ABS AUTO: 4.4 10*3/uL (ref 1.8–7.7)
NEUTROPHILS % AUTO: 68.1 %
PLATELET COUNT: 274 10*3/uL (ref 130–400)
RDW: 13.6 % (ref 0.0–14.7)
RED CELL COUNT: 4.76 10*6/uL (ref 4.00–5.20)
White Blood Cell Count: 6.5 10*3/uL (ref 4.5–11.0)

## 2018-07-05 LAB — INR
INR: 0.92 (ref 0.87–1.18)
PROTHROMBIN TIME: 9.1 s (ref 8.0–11.9)

## 2018-07-05 LAB — BASIC METABOLIC PANEL
CALCIUM: 9.2 mg/dL (ref 8.6–10.5)
CARBON DIOXIDE TOTAL: 26 mmol/L (ref 24–32)
CHLORIDE: 103 mmol/L (ref 95–110)
CREATININE BLOOD: 0.71 mg/dL (ref 0.44–1.27)
E-GFR, NON-AFRICAN AMERICAN (FEMALE): 96 mL/min/{1.73_m2}
GLUCOSE: 82 mg/dL (ref 70–99)
POTASSIUM: 4.1 mmol/L (ref 3.3–5.0)
SODIUM: 139 mmol/L (ref 135–145)
UREA NITROGEN, BLOOD (BUN): 8 mg/dL (ref 8–22)

## 2018-07-05 LAB — TYPE AND SCREEN
ANTIBODY SCREEN: NEGATIVE
PATIENT BLOOD TYPE: A POS

## 2018-07-05 NOTE — Nursing Note (Signed)
Immunization VIS documentation(s) were given to patient to review. All questions were answered and the patient consented to the Immunization(s) being given. Patient allergies were reviewed and no contraindications were found. The immunization(s) were given as ordered. The patient was observed for any immediate reactions to the vaccine. None were observed.  Hep B vaccine administered.  Jahir Halt C Jaleil Renwick, RN

## 2018-07-14 ENCOUNTER — Other Ambulatory Visit: Payer: Self-pay

## 2018-07-14 DIAGNOSIS — Z1211 Encounter for screening for malignant neoplasm of colon: Principal | ICD-10-CM

## 2018-07-19 NOTE — Progress Notes (Signed)
Per pt stopped ASA 2 weeks ago

## 2018-07-20 MED ORDER — CYANOCOBALAMIN (VIT B-12) 500 MCG TABLET
500 | ORAL | Status: AC
Start: 2018-07-20 — End: ?

## 2018-07-20 MED ORDER — KETOTIFEN 0.025 % (0.035 %) EYE DROPS
0.025 | OPHTHALMIC | Status: AC
Start: 2018-07-20 — End: ?

## 2018-07-20 NOTE — Patient Instructions (Signed)
We want your upcoming surgical visit to be as safe and comfortable as possible. The following instructions are designed to prepare you for your surgical hospitalization.  Please read and follow all instructions carefully.    Procedure Location  Your procedure is scheduled to take place at [facility] -  OR Arrivals: [address] 1st Floor. Report to Room M104J at assigned ARRIVAL time noted below. Phone [phone]    Procedure Date and Time  Please arrive on  07/21/2018 . Arrive at 6AM    If for any reason your surgery start time is changed, you will receive a call from your surgeons office.  Should you have any questions regarding the date or time of arrival, please call your surgeons office and ask to speak with his/her practice assistant.    Preparing for Surgery  What can I eat or drink on the day of surgery?   Please do not have anything to eat or drink except clear liquids after midnight the evening before your surgery (including gum, candy or mints).   You may have clear liquids on the day of surgery up to 2 hours prior to arrival.   Clear liquids include:   Non-pulp, clear apple juice.   Tea with sugar or sweetener (NO milk, cream, or milk substitute)   Gatorade   Water   If you drink anything other than clear liquids on the day of surgery, or if you have ANYTHING to drink in the two hours prior to hospital arrival, then your doctors will cancel your surgery for your safety.   If you have been instructed to take any medications the day of surgery, take them with a small sip of water.     If your surgeon has given you additional instructions about what to eat or drink before the day of surgery, please follow those instructions as well.    Which medications should I take on the day of surgery?   Casey Ali   Prior to Surgery Medication Instructions HAR:    Printed on:07/20/18 1410   Medication Information Take Morning of Surgery Special Instructions          aspirin 325 mg  buffered tablet  (aspirin/calcium carbonate/mag)  Take 650 mg by mouth.   Do not take for 7 days before surgery       cyanocobalamin, Vitamin B12, 500 mcg tablet  (cyanocobalamin (vitamin B-12))  Take 500 mcg by mouth every other day.   no     ketotifen (ZADITOR) 0.025 % (0.035 %) ophthalmic solution  (ketotifen fumarate)  Place 1 drop into both eyes 2 (two) times daily.     Yes     loratadine (CLARITIN ORAL)  (loratadine)  Take 5 mg by mouth daily as needed.      yes     PLEASE DO NOT TAKE ANY ASPIRIN, ADVIL, MOTRIN, ALEVE, IBUPROFEN, NAPROXEN FOR PAIN 7 days BEFORE YOUR SURGERY.  YOU CAN TAKE TYLENOL IF NEEDED and if you are allowed to take Tylenol (acetaminophen)    Stop all supplements 1 week before surgery especially Fish Oil, Vit E, Turmeric, Valerian, St John's Wort, Yohimbe, [employer], [place], Occidental Petroleum, Kava Kava, [place], Ginseng, Ginkgo Biloba, Ginger, Garlic, [place]ever[place], Echinacea, [place], Oasis, [place].     DO NOT TAKE ANY OVER THE COUNTER MEDICATIONS OR SUPPLEMENTS 1 WEEK BEFORE THE SURGERY THAT OUR ANESTHESIA DEPARTMENT IS NOT AWARE OF.    Preparing to Come to the [facility] with Hibiclens (  Chlorhexidine) soap to prevent infections     Instructions:   You should shower with Hibiclens soap at least two times before your surgery-on the night before surgery and the morning before your surgery.     How to shower with Hibiclens soap:   1.  Rinse your body with warm water.   2.  Wash your hair with regular shampoo.   Rinse your hair with water. If you are having neck surgery, use Hibiclens soap instead of your regular shampoo to wash your hair.   Rinse your hair with water.   3.  Wet a clean sponge. Turn off the water. Apply Hibiclens soap liberally.   4.  Firmly massage all areas: neck, arms, chest, back, abdomen, hips, groin, genitals (external only) and buttocks. Avoid using Hibiclens soap on your face. Clean your legs and feet and between your fingers and toes. Pay special  attention to the site of your surgery and all surrounding skin. Ask for help to clean your back if you are having back surgery.   5.  Lather again before rinsing.   6.  Turn on the water and rinse Hibiclens off your body.   7.  Dry off with a clean towel.   8.  Don't apply lotions or powders.   9.  Use clean clothes and freshly laundered bed linens     Important showering reminders:    Do not use any other soaps or body wash when using Hibiclens. Other soaps can block the Hibiclens benefits.    After showering, do not apply lotion, cream, powder, deodorant, or hair conditioner.    Do not shave or remove body hair. Shaving your face is generally fine. If you are having head surgery, however, ask your doctor whether you can shave.    Hibiclens is safe to use on minor wounds, rashes, burns, and over staples and stitches.    Allergic reactions are rare but may occur. If you have an allergic reaction, stop using Hibiclens and call your doctor if you have a skin irritation.    If you are allergic to Hibiclens, please follow the bathing instructions above using an over-the-counter regular soap instead of Hibiclens..    If your surgeon has given you additional instructions, please follow those.     Wear casual, loose fitting, comfortable clothing and leave all valuables, including jewelry, and large sums of cash at home.   We are not responsible for any valuables or personal belongings brought into the hospital.  Such items include, but are not limited to:  cell phones, computers, tablets, jewelry, valuables, money, credit cards, or any other personal items. We encourage patients to leave these items at home.  If valuables are brought to the hospital, they will be given to a friend or family member.    For your safety, jewelry and piercings may not be worn in the OR.  If you wear jewelry/piercings that are difficult to remove, please remove them before coming to the hospital.  Otherwise, removal will occur in the  Pre-operative department and may result in damage of the jewelry.   Leave contact lenses at home. Wear your eyeglasses and bring a case.   If you develop any illness prior to surgery (fever, cough, sore throat, cold, flu, infection), please call your surgeon and the [facility] at [phone].   If you are spending the night you may bring toiletries and sleeping clothes if you desire; otherwise the hospital will provide them for  you.    DO NOT bring your medications with you to the hospital unless you were specifically instructed to do so.   DO bring a list of your medications including dose(s) and when you take them.   DO bring TWO forms of ID - including one ID with a photo.    Leaving the Hospital   Please ask your surgeon about your anticipated length of stay.   It is recommended that all patients have a responsible person at home the first night after discharge from the hospital.   ALL patients, including same day surgery patients, must arrange for an adult to drive/escort them home upon discharge.  Patients going home the same day of surgery will have their procedure cancelled if these arrangements are not made ahead of time.    Family and Friends  Friends and family may wait in the assigned waiting areas.  Patient care coordinators are available in these patient waiting areas to provide updates regarding patient progress; hospital room assignments; and discharge planning (for same day surgery).    [facility] OR, [address] Room #M104J  Guided Imagery  Research has shown that listening to guided imagery is helpful for many health conditions.  [facility] offers these sessions to listen to at the following website: [email or web address].http://huff.org/     Please consider guided imagery to prepare for surgery and for coping with  stress, sleep and pain.

## 2018-07-20 NOTE — H&P (View-Only) (Signed)
ANESTHESIA PRE-OP H&P      Anesthesia Encounter History        CC/HPI/Past Medical History Summary: Casey Ali is a 57 y.o. 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     S/f LAMINOPLASTY WITH PLATING, CERVICAL ([employer] CenterPiece) with Dr Jodie Echevaria at [facility] on 07/21/2018    ~Allergic state, takes loratadine daily    (Please refer to APeX Allergies, Problems, Past Medical History, Past Surgical History, Social History, and Family History activities, Results for current data from these respective sections of the chart; these sections of the chart are also summarized in reports, including the Patient Summary Extracts found in Chart Review)      Summary of Outside Records:  ~~~~~~~~~~~~~~~~~~~~~~~~  Outside Other Records Scanned - - - Please see 'Outside Scanned Records' in Chart Review  Labs 07/05/2018:  Na 139, K+ 4.1, glucose 82, creatinine 0.71, antibody neg, wbc 6.5, H/H 14.1/41.4, platelet 274  ~~~~~~~~~~~~~~~~~~~~~~~~  Summary of Prior Anesthetics: Previous anesthetic with [facility]  PONV with C-Sections, requests LR IVF if possible,       Patient Active Problem List   Diagnosis    Cervical myelopathy with cervical radiculopathy     Current Medications       Dosage    aspirin 325 mg buffered tablet Take 650 mg by mouth.    cyanocobalamin, Vitamin B12, 500 mcg tablet Take 500 mcg by mouth every other day.    ketotifen (ZADITOR) 0.025 % (0.035 %) ophthalmic solution Place 1 drop into both eyes 2 (two) times daily.    loratadine (CLARITIN ORAL) Take 5 mg by mouth daily as needed.         Allergies/Contraindications   Allergen Reactions    Peanut Swelling    Pollen Extracts     Shellfish Containing Products Swelling    Suture      Patient reports allergy to cat gut suture- reaction not specified.      Penicillins Hives and Rash     Past Medical History:   Diagnosis Date    Adverse effect of anesthesia     Asthma     PONV (postoperative nausea and vomiting)      Social History     Socioeconomic History    Marital status: Married     Spouse name: Not on file    Number of children: Not on file    Years of education: Not on file    Highest education level: Not on file   Occupational History    Not on file   Social Needs    Financial resource strain: Not on file    Food insecurity:     Worry: Not on file     Inability: Not on file    Transportation needs:     Medical: Not on file     Non-medical: Not on file   Tobacco Use    Smoking status: Never Smoker    Smokeless tobacco: Never Used   Substance and Sexual Activity    Alcohol use: Yes     Alcohol/week: 0.6 oz     Types: 1 Glasses of wine per week    Drug use: Not Currently    Sexual activity: Not on file   Lifestyle    Physical activity:  Days per week: Not on file     Minutes per session: Not on file    Stress: Not on file   Relationships    Social connections:     Talks on phone: Not on file     Gets together: Not on file     Attends religious service: Not on file     Active member of club or organization: Not on file     Attends meetings of clubs or organizations: Not on file     Relationship status: Not on file    Intimate partner violence:     Fear of current or ex partner: Not on file     Emotionally abused: Not on file     Physically abused: Not on file     Forced sexual activity: Not on file   Other Topics Concern    Not on file   Social History Narrative    Not on file     Past Surgical History:   Procedure Laterality Date    CESAREAN SECTION      X2    CYST REMOVAL      WISDOM TOOTH EXTRACTION       Ht 162.6 cm (5\' 4" )   Wt 60.3 kg (133 lb)   BMI 22.83 kg/m       BP Readings from Last 3 Encounters:   No data found for BP       Review of Systems   Functional Status: Climb a flight of stairs or walk up a hill (5.50 METs)    Constitutional: Negative for activity change, chills, fatigue and fever.   Airway: Positive for neck stiffness (limited with extension). Negative for Difficulty Opening Mouth, loose teeth, Dental Hardware, other dental problems, TMJ problem, snoring, witnessed apnea and daytime somnolence  HENT: Negative for congestion, hearing loss, postnasal drip, rhinorrhea, sinus pressure, sneezing, sore throat and trouble swallowing.   Eyes: Positive for visual disturbance (glasses).   Respiratory: Negative for chest tightness, cough, Recent URI Symptoms, shortness of breath and wheezing.    Cardiovascular: Negative for chest pain, leg swelling and palpitations.   Gastrointestinal: Negative for abdominal distention, abdominal pain, constipation, diarrhea, nausea, vomiting and Negative for GERD symptoms.   Genitourinary: Negative for difficulty urinating.   Musculoskeletal: Negative for back pain, gait problem and neck pain.        Able to lie flat.      Skin: Negative for rash and wound.   Neurological: Positive for headaches (constant) and numbness (left arm and hand tingles). Negative for dizziness, light-headedness, tremors and weakness.   Hematological: Positive for environmental allergies. Bruises/bleeds easily.        Denies hx previous blood transfusions, bleeding disorder, DVT/PE.  Denies hx DM or thyroid or other endocrine disorders     Psychiatric/Behavioral: The patient is nervous/anxious.        Physical Exam    Prepare (Pre-Operative Clinic) Assessment/Plan/Narrative  Prepare Clinic consult type: Telephone  07/20/18-PC ONLY.  AVS DISCUSSED.   Patient instructions sent unsecured per patient request via Outlook email.  Gentry Roch, NP      Obstructive Sleep Apnea Screening  STOPBANG Score:      S Do you Snore loudly (louder than talking or loud  enough to be heard through closed doors)? No    T Do you often feel Tired, fatigued, or sleepy during daytime? No    O Has anyone Observed you stop breathing  during  your sleep? No  P Do you have or are you being treated for high blood Pressure? No    B BMI more than 35 KG/m^2? No    A Age over 31 years old? Yes    N Neck circumference > 16 inches (40cm)?     G Gender: Female? No    STOPBANG Total Score 1         Risk Level (based only on STOPBANG) Low - Incomplete       CPAP/BiPAP prescribed - No.              Anesthesia Assessment and Plan  ASA 2   Anesthesia Plan  Invasive Monitors/Vascular Access: None    (See Anesthesia Record for attending attestation)    [Please note, smart link data included in this note may not reflect changes since note creation. Please see appropriate section of APeX for up-to-the minute information.]

## 2018-07-20 NOTE — Anesthesia Pre-Procedure Evaluation (Addendum)
ANESTHESIA PRE-OP H&P      Anesthesia Encounter History    I have assessed Shared Data in APeX as listed in the Pre-Op Extract report.    CC/HPI/Past Medical History Summary: Casey Ali is a 57 y.o. 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     S/f LAMINOPLASTY WITH PLATING, CERVICAL (Medtronic CenterPiece) with Dr Jodie Echevaria at [facility] on 07/21/2018    ~Allergic state, takes loratadine daily    (Please refer to APeX Allergies, Problems, Past Medical History, Past Surgical History, Social History, and Family History activities, Results for current data from these respective sections of the chart; these sections of the chart are also summarized in reports, including the Patient Summary Extracts found in Chart Review)      Summary of Outside Records:  ~~~~~~~~~~~~~~~~~~~~~~~~  Outside Other Records Scanned - - - Please see 'Outside Scanned Records' in Chart Review  Labs 07/05/2018:  Na 139, K+ 4.1, glucose 82, creatinine 0.71, antibody neg, wbc 6.5, H/H 14.1/41.4, platelet 274  ~~~~~~~~~~~~~~~~~~~~~~~~  Summary of Prior Anesthetics: Previous anesthetic with [facility]  PONV with C-Sections, requests LR IVF if possible,       Patient Active Problem List   Diagnosis    Cervical myelopathy with cervical radiculopathy     Current Medications       Dosage    aspirin 325 mg buffered tablet Take 650 mg by mouth.    cyanocobalamin, Vitamin B12, 500 mcg tablet Take 500 mcg by mouth every other day.    ketotifen (ZADITOR) 0.025 % (0.035 %) ophthalmic solution Place 1 drop into both eyes 2 (two) times daily.    loratadine (CLARITIN ORAL) Take 5 mg by mouth daily as needed.         Allergies/Contraindications   Allergen Reactions    Peanut Swelling    Pollen Extracts     Shellfish Containing Products Swelling    Suture       Patient reports allergy to cat gut suture- reaction not specified.     Penicillins Hives and Rash     Past Medical History:   Diagnosis Date    Adverse effect of anesthesia     Asthma     PONV (postoperative nausea and vomiting)      Social History     Socioeconomic History    Marital status: Married     Spouse name: Not on file    Number of children: Not on file    Years of education: Not on file    Highest education level: Not on file   Occupational History    Not on file   Social Needs    Financial resource strain: Not on file    Food insecurity:     Worry: Not on file     Inability: Not on file    Transportation needs:     Medical: Not on file     Non-medical: Not on file   Tobacco Use    Smoking status: Never Smoker    Smokeless tobacco: Never Used   Substance and Sexual Activity    Alcohol use: Yes     Alcohol/week: 0.6 oz     Types: 1 Glasses of wine per week    Drug use: Not Currently  Sexual activity: Not on file   Lifestyle    Physical activity:     Days per week: Not on file     Minutes per session: Not on file    Stress: Not on file   Relationships    Social connections:     Talks on phone: Not on file     Gets together: Not on file     Attends religious service: Not on file     Active member of club or organization: Not on file     Attends meetings of clubs or organizations: Not on file     Relationship status: Not on file    Intimate partner violence:     Fear of current or ex partner: Not on file     Emotionally abused: Not on file     Physically abused: Not on file     Forced sexual activity: Not on file   Other Topics Concern    Not on file   Social History Narrative    Not on file     Past Surgical History:   Procedure Laterality Date    CESAREAN SECTION      X2    CYST REMOVAL      WISDOM TOOTH EXTRACTION       Ht 162.6 cm (5\' 4" )   Wt 60.3 kg (133 lb)   BMI 22.83 kg/m       BP Readings from Last 3 Encounters:   No data found for BP       Review of Systems    Functional Status: Climb a flight of stairs or walk up a hill (5.50 METs)   Constitutional: Negative for activity change, chills, fatigue and fever.   Airway: Positive for neck stiffness (limited with extension). Negative for Difficulty Opening Mouth, loose teeth, Dental Hardware, other dental problems, TMJ problem, snoring, witnessed apnea and daytime somnolence  HENT: Negative for congestion, hearing loss, postnasal drip, rhinorrhea, sinus pressure, sneezing, sore throat and trouble swallowing.   Eyes: Positive for visual disturbance (glasses).   Respiratory: Negative.  Negative for chest tightness, cough, Recent URI Symptoms, shortness of breath and wheezing.         Cough variant asthma controlled with antihistamine.  Pneumonia 4 years ago, no hospitalization   Cardiovascular: Negative.  Negative for chest pain, leg swelling and palpitations.   Gastrointestinal: Negative for abdominal distention, abdominal pain, constipation, diarrhea, nausea, vomiting and Negative for GERD symptoms.   Genitourinary: Negative for difficulty urinating.   Musculoskeletal: Positive for arthralgias. Negative for back pain, gait problem and neck pain.        Able to lie flat.   Stabbing pain in left  triceps and shoulders, numbing and tingling in left hand     Skin: Negative for rash and wound.   Neurological: Positive for headaches (constant) and numbness (left arm and hand tingles). Negative for dizziness, light-headedness, tremors and weakness.   Hematological: Positive for environmental allergies. Bruises/bleeds easily.        Denies hx previous blood transfusions, bleeding disorder, DVT/PE.  Denies hx DM or thyroid or other endocrine disorders     Psychiatric/Behavioral: The patient is nervous/anxious.        Physical Exam   Airway: Modified Mallampati score: II. Thyromental distance: < 4 cm. Mouth opening: good. Neck range of motion: limited. Patient has neck pain with extension, with flexion right, with flexion left, with  rotation right and with rotation left.     Neck: Pain  with movement present.   Cardiovascular: Normal rate, regular rhythm and normal heart sounds.   Pulmonary/Chest: Effort normal and breath sounds normal.   Dental: The patient has loose teeth.          Prepare (Pre-Operative Clinic) Assessment/Plan/Narrative  Prepare Clinic consult type: Telephone  07/20/18-PC ONLY.  AVS DISCUSSED.   Patient instructions sent unsecured per patient request via Outlook email.  Gentry Roch, NP      Obstructive Sleep Apnea Screening  STOPBANG Score:      S Do you Snore loudly (louder than talking or loud  enough to be heard through closed doors)? No    T Do you often feel Tired, fatigued, or sleepy during daytime? No    O Has anyone Observed you stop breathing during  your sleep? No    P Do you have or are you being treated for high blood Pressure? No    B BMI more than 35 KG/m^2? No    A Age over 65 years old? Yes    N Neck circumference > 16 inches (40cm)? No    G Gender: Female? No    STOPBANG Total Score 1         Risk Level (based only on STOPBANG) Low       CPAP/BiPAP prescribed - No.              Anesthesia Assessment and Plan  ASA 2   Anesthesia Plan  Anesthesia Type: general  Induction Technique:IntraVenous  Invasive Monitors/Vascular Access: None  Airway Techniques: video laryngoscope  Other Techniques: None  Planned Recovery Location: PACU  Blood Product Preparation  Blood Products Plan: Reviewed peri-op blood report  Consent: reviewed consent for blood transfusion  Anesthesia Potential Complication Discussion  There is the possibility of rare but serious complications.  Informed Consent for Anesthesia  Consent obtained from patient    Risks, benefits and alternatives including those of invasive monitoring discussed. Increased risks (as above) discussed.  Questions invited and all answered.  Interpreter: N/A - patient/guardian's preferred language is [place].  Consent granted for anesthetic plan    Quality Measure  Documentation   Opioid Therapy Planned? Yes    (See Anesthesia Record for attending attestation)    [Please note, smart link data included in this note may not reflect changes since note creation. Please see appropriate section of APeX for up-to-the minute information.]

## 2018-07-21 ENCOUNTER — Inpatient Hospital Stay
Admit: 2018-07-21 | Discharge: 2018-07-23 | Disposition: A | Discharge status: Home / Self Care | Payer: BLUE CROSS/BLUE SHIELD | Source: Ambulatory Visit | Attending: Physician | Admitting: Physician

## 2018-07-21 DIAGNOSIS — Z91010 Allergy to peanuts: Secondary | ICD-10-CM

## 2018-07-21 DIAGNOSIS — M50022 Cervical disc disorder at C5-C6 level with myelopathy: Secondary | ICD-10-CM

## 2018-07-21 DIAGNOSIS — Z98891 History of uterine scar from previous surgery: Secondary | ICD-10-CM

## 2018-07-21 DIAGNOSIS — Z8701 Personal history of pneumonia (recurrent): Secondary | ICD-10-CM

## 2018-07-21 DIAGNOSIS — Z7982 Long term (current) use of aspirin: Secondary | ICD-10-CM

## 2018-07-21 DIAGNOSIS — M50122 Cervical disc disorder at C5-C6 level with radiculopathy: Secondary | ICD-10-CM

## 2018-07-21 DIAGNOSIS — Q7649 Other congenital malformations of spine, not associated with scoliosis: Secondary | ICD-10-CM

## 2018-07-21 DIAGNOSIS — M4802 Spinal stenosis, cervical region: Secondary | ICD-10-CM

## 2018-07-21 DIAGNOSIS — M5030 Other cervical disc degeneration, unspecified cervical region: Secondary | ICD-10-CM

## 2018-07-21 DIAGNOSIS — Z888 Allergy status to other drugs, medicaments and biological substances status: Secondary | ICD-10-CM

## 2018-07-21 DIAGNOSIS — M4712 Other spondylosis with myelopathy, cervical region: Secondary | ICD-10-CM

## 2018-07-21 DIAGNOSIS — M5412 Radiculopathy, cervical region: Secondary | ICD-10-CM

## 2018-07-21 DIAGNOSIS — Z880 Allergy status to penicillin: Secondary | ICD-10-CM

## 2018-07-21 DIAGNOSIS — J45991 Cough variant asthma: Secondary | ICD-10-CM

## 2018-07-21 DIAGNOSIS — M542 Cervicalgia: Secondary | ICD-10-CM

## 2018-07-21 DIAGNOSIS — Z91013 Allergy to seafood: Secondary | ICD-10-CM

## 2018-07-21 DIAGNOSIS — Z91048 Other nonmedicinal substance allergy status: Secondary | ICD-10-CM

## 2018-07-21 DIAGNOSIS — G992 Myelopathy in diseases classified elsewhere: Secondary | ICD-10-CM

## 2018-07-21 MED ORDER — PHENYLEPHRINE 10 MG/ML INJECTION SOLUTION
10 mg/mL | INTRAMUSCULAR | Status: DC | PRN
  Administered 2018-07-21 (×3): via INTRAVENOUS

## 2018-07-21 MED ORDER — LORATADINE 10 MG TABLET
10 mg | ORAL | Status: DC | PRN
  Administered 2018-07-22: 17:00:00 via ORAL

## 2018-07-21 MED ORDER — CLINDAMYCIN 150 MG/ML INJECTION SOLUTION
150 | INTRAMUSCULAR | Status: AC
Start: 2018-07-21 — End: ?

## 2018-07-21 MED ORDER — HYDROMORPHONE 2 MG/ML INJECTION SOLUTION
2 mg/mL | INTRAMUSCULAR | Status: DC | PRN
  Administered 2018-07-21: 19:00:00 via INTRAVENOUS

## 2018-07-21 MED ORDER — LIDOCAINE 5 % TOPICAL PATCH: 5 % | TOPICAL | Status: DC

## 2018-07-21 MED ORDER — SODIUM CHLORIDE 0.9 % (FLUSH) INJECTION SYRINGE
0.9 % | INTRAMUSCULAR | Status: DC | PRN
  Administered 2018-07-23: 17:00:00 via INTRAVENOUS

## 2018-07-21 MED ORDER — LIDOCAINE 1 %-EPINEPHRINE 1:100,000 INJECTION SOLUTION
1 | INTRAMUSCULAR | Status: AC
Start: 2018-07-21 — End: ?

## 2018-07-21 MED ORDER — HYDROMORPHONE (PF) 50 MG/50 ML (1 MG/ML) IN 0.9 % NACL IV PCA SYRINGE
50 mg/50 mL (1 mg/mL) | INTRAVENOUS | Status: DC
  Administered 2018-07-21: 20:00:00 via INTRAVENOUS

## 2018-07-21 MED ORDER — ONDANSETRON HCL (PF) 4 MG/2 ML INJECTION SOLUTION: 4 mg/2 mL | INTRAMUSCULAR | Status: DC | PRN

## 2018-07-21 MED ORDER — LIDOCAINE (PF) 100 MG/5 ML (2 %) INTRAVENOUS SYRINGE
100 | INTRAVENOUS | Status: DC | PRN
Start: 2018-07-21 — End: 2018-07-21
  Administered 2018-07-21: 16:00:00 via INTRAVENOUS

## 2018-07-21 MED ORDER — PHENYLEPHRINE 10 MG/ML INJECTION SOLUTION
0.9 % | INTRAVENOUS | Status: DC | PRN
  Administered 2018-07-21: 17:00:00 25 via INTRAVENOUS

## 2018-07-21 MED ORDER — GABAPENTIN 300 MG CAPSULE
300 mg | ORAL | Status: DC
  Administered 2018-07-22: 16:00:00 via ORAL

## 2018-07-21 MED ORDER — SODIUM CHLORIDE 0.9 % INTRAVENOUS SOLUTION
0.9 | INTRAVENOUS | Status: DC | PRN
Start: 2018-07-21 — End: 2018-07-21
  Administered 2018-07-21: 17:00:00 10

## 2018-07-21 MED ORDER — PROCHLORPERAZINE EDISYLATE 10 MG/2 ML (5 MG/ML) INJECTION SOLUTION: 10 mg/2 mL (5 mg/mL) | INTRAMUSCULAR | Status: DC | PRN

## 2018-07-21 MED ORDER — THROMBIN (RECOMBINANT) 20,000 UNIT TOPICAL SOLUTION
20000 | Freq: Once | TOPICAL | Status: DC
Start: 2018-07-21 — End: 2018-07-21

## 2018-07-21 MED ORDER — METHYLPREDNISOLONE ACETATE 40 MG/ML SUSPENSION FOR INJECTION
40 | INTRAMUSCULAR | Status: AC
Start: 2018-07-21 — End: ?

## 2018-07-21 MED ORDER — ONDANSETRON HCL (PF) 4 MG/2 ML INJECTION SOLUTION
4 mg/2 mL | INTRAMUSCULAR | Status: DC | PRN
  Administered 2018-07-21: 17:00:00 via INTRAVENOUS

## 2018-07-21 MED ORDER — ACETAMINOPHEN 500 MG GELCAP
500 | Freq: Once | ORAL | Status: AC
Start: 2018-07-21 — End: 2018-07-21
  Administered 2018-07-21: 16:00:00 via ORAL

## 2018-07-21 MED ORDER — CLINDAMYCIN 150 MG/ML INJECTION SOLUTION
150 mg/mL | INTRAMUSCULAR | Status: DC | PRN
  Administered 2018-07-21: 17:00:00 600 via INTRAVENOUS

## 2018-07-21 MED ORDER — SODIUM CHLORIDE 0.9 % INTRAVENOUS SOLUTION
0.9 % | INTRAVENOUS | Status: DC
  Administered 2018-07-22: 18:00:00 via INTRAVENOUS
  Administered 2018-07-22: 07:00:00 1000 mg/kg via INTRAVENOUS
  Administered 2018-07-23: 07:00:00 via INTRAVENOUS

## 2018-07-21 MED ORDER — SODIUM PHOSPHATES 19 GRAM-7 GRAM/118 ML ENEMA: 19-7 gram/133 mL | RECTAL | Status: DC | PRN

## 2018-07-21 MED ORDER — ONDANSETRON HCL (PF) 4 MG/2 ML INJECTION SOLUTION
4 mg/2 mL | INTRAMUSCULAR | Status: DC | PRN
  Administered 2018-07-22: 18:00:00 4 mg via INTRAVENOUS
  Administered 2018-07-23: 17:00:00 via INTRAVENOUS

## 2018-07-21 MED ORDER — FENTANYL (PF) 50 MCG/ML INJECTION SOLUTION
50 mcg/mL | INTRAMUSCULAR | Status: AC | PRN
  Administered 2018-07-21 (×5): via INTRAVENOUS

## 2018-07-21 MED ORDER — LACTATED RINGERS INTRAVENOUS SOLUTION: INTRAVENOUS | Status: DC

## 2018-07-21 MED ORDER — SODIUM CHLORIDE 0.9 % INTRAVENOUS SOLUTION
0.9 % | INTRAVENOUS | Status: DC
  Administered 2018-07-21: 20:00:00 75 mL/h via INTRAVENOUS

## 2018-07-21 MED ORDER — LIDOCAINE (PF) 20 MG/ML (2 %) INJECTION SOLUTION
20 mg/mL (2 %) | INTRAMUSCULAR | Status: DC | PRN
  Administered 2018-07-21: 16:00:00 via INTRAVENOUS

## 2018-07-21 MED ORDER — THROMBIN (RECOMBINANT) 20,000 UNIT TOPICAL SOLUTION
20000 | Freq: Once | TOPICAL | Status: AC
Start: 2018-07-21 — End: 2018-07-21
  Administered 2018-07-21: 17:00:00 20000 via TOPICAL

## 2018-07-21 MED ORDER — SODIUM CHLORIDE 0.9 % INTRAVENOUS SOLUTION
0.9 % | INTRAVENOUS | Status: DC | PRN
  Administered 2018-07-21: 16:00:00 via INTRAVENOUS

## 2018-07-21 MED ORDER — SUGAMMADEX 100 MG/ML INTRAVENOUS SOLUTION
100 mg/mL | INTRAVENOUS | Status: DC | PRN
  Administered 2018-07-21: 19:00:00 via INTRAVENOUS

## 2018-07-21 MED ORDER — MIDAZOLAM 1 MG/ML INJECTION SOLUTION
1 | INTRAMUSCULAR | Status: DC | PRN
Start: 2018-07-21 — End: 2018-07-21
  Administered 2018-07-21: 16:00:00 via INTRAVENOUS

## 2018-07-21 MED ORDER — PROPOFOL 10 MG/ML INTRAVENOUS EMULSION
10 | INTRAVENOUS | Status: DC | PRN
Start: 2018-07-21 — End: 2018-07-21
  Administered 2018-07-21 (×2): via INTRAVENOUS

## 2018-07-21 MED ORDER — HYDROMORPHONE (PF) 0.5 MG/0.5 ML INJECTION SYRINGE: 0.5 mg/0.5 mL | INTRAMUSCULAR | Status: DC | PRN

## 2018-07-21 MED ORDER — HYDROMORPHONE (PF) 50 MG/50 ML (1 MG/ML) IN 0.9 % NACL IV PCA SYRINGE: 50 mg/50 mL (1 mg/mL) | INTRAVENOUS | Status: DC

## 2018-07-21 MED ORDER — GABAPENTIN 300 MG CAPSULE
300 | Freq: Once | ORAL | Status: AC
Start: 2018-07-21 — End: 2018-07-21
  Administered 2018-07-21: 16:00:00 via ORAL

## 2018-07-21 MED ORDER — GELATIN SPONGE,ABSORBABLE-PORCINE SKIN 12 MM-7 MM TOPICAL SPONGE
12-7 | TOPICAL | Status: DC | PRN
Start: 2018-07-21 — End: 2018-07-21
  Administered 2018-07-21: 18:00:00 via TOPICAL

## 2018-07-21 MED ORDER — SODIUM CHLORIDE 0.9 % (FLUSH) INJECTION SYRINGE
0.9 % | INTRAMUSCULAR | Status: DC
  Administered 2018-07-22: 05:00:00 via INTRAVENOUS
  Administered 2018-07-23 (×2): 3 mL via INTRAVENOUS

## 2018-07-21 MED ORDER — AZELASTINE 0.05 % EYE DROPS
0.05 % | OPHTHALMIC | Status: DC
  Administered 2018-07-22: 15:00:00 1 [drp] via OPHTHALMIC

## 2018-07-21 MED ORDER — PROCHLORPERAZINE MALEATE 10 MG TABLET: 10 mg | ORAL | Status: DC | PRN

## 2018-07-21 MED ORDER — BUPIVACAINE (PF) 0.25 % (2.5 MG/ML) INJECTION SOLUTION
2.5 | INTRAMUSCULAR | Status: AC
Start: 2018-07-21 — End: ?

## 2018-07-21 MED ORDER — PROPOFOL 10 MG/ML INTRAVENOUS EMULSION
10 mg/mL | INTRAVENOUS | Status: DC | PRN
  Administered 2018-07-21: 16:00:00 via INTRAVENOUS

## 2018-07-21 MED ORDER — NALOXONE 0.4 MG/ML INJECTION SOLUTION: 0.4 mg/mL | INTRAMUSCULAR | Status: DC | PRN

## 2018-07-21 MED ORDER — ONDANSETRON HCL 4 MG TABLET: 4 mg | ORAL | Status: DC | PRN

## 2018-07-21 MED ORDER — BACLOFEN 10 MG TABLET
10 mg | ORAL | Status: DC | PRN
  Administered 2018-07-23: 01:00:00 via ORAL

## 2018-07-21 MED ORDER — ROCURONIUM 10 MG/ML INTRAVENOUS SOLUTION
10 | INTRAVENOUS | Status: DC | PRN
Start: 2018-07-21 — End: 2018-07-21
  Administered 2018-07-21: 16:00:00 via INTRAVENOUS

## 2018-07-21 MED ORDER — VANCOMYCIN 1,000 MG INTRAVENOUS INJECTION: 1,000 mg | INTRAVENOUS | Status: DC

## 2018-07-21 MED ORDER — GELATIN SPONGE,ABSORBABLE-PORCINE SKIN 12 MM-7 MM TOPICAL SPONGE
12-7 | Freq: Once | TOPICAL | Status: AC
Start: 2018-07-21 — End: 2018-07-21
  Administered 2018-07-21: 18:00:00 via TOPICAL

## 2018-07-21 MED ORDER — VANCOMYCIN 1,000 MG INTRAVENOUS INJECTION
1000 | INTRAVENOUS | Status: AC
Start: 2018-07-21 — End: ?

## 2018-07-21 MED ORDER — OXYCODONE 5 MG TABLET
5 mg | ORAL | Status: DC | PRN
  Administered 2018-07-22 – 2018-07-23 (×8): via ORAL

## 2018-07-21 MED ORDER — MEPERIDINE (PF) 25 MG/ML INJECTION SOLUTION: 25 mg/mL | INTRAMUSCULAR | Status: DC | PRN

## 2018-07-21 MED ORDER — OXYCODONE 5 MG TABLET: 5 mg | ORAL | Status: DC | PRN

## 2018-07-21 MED ORDER — POLYETHYLENE GLYCOL 3350 17 GRAM ORAL POWDER PACKET: 17 gram | ORAL | Status: DC

## 2018-07-21 MED ORDER — DEXAMETHASONE SODIUM PHOSPHATE 4 MG/ML INJECTION SOLUTION
4 mg/mL | INTRAMUSCULAR | Status: DC | PRN
  Administered 2018-07-21: 16:00:00 via INTRAVENOUS

## 2018-07-21 MED ORDER — DOCUSATE SODIUM 250 MG CAPSULE: 250 mg | ORAL | Status: DC

## 2018-07-21 MED ORDER — VANCOMYCIN 1,000 MG INTRAVENOUS INJECTION
1000 | INTRAVENOUS | Status: DC | PRN
Start: 2018-07-21 — End: 2018-07-21
  Administered 2018-07-21: 18:00:00

## 2018-07-21 MED ORDER — BUPIVACAINE-EPINEPHRINE 0.25 %-1:200,000 INJECTION SOLUTION
0.25 | Freq: Once | INTRAMUSCULAR | Status: AC
Start: 2018-07-21 — End: 2018-07-21
  Administered 2018-07-21: 19:00:00 via SUBCUTANEOUS

## 2018-07-21 MED ORDER — SENNOSIDES 8.6 MG TABLET: 8.6 mg | ORAL | Status: DC

## 2018-07-21 MED ORDER — CYANOCOBALAMIN (VIT B-12) 500 MCG TABLET: 500 mcg | ORAL | Status: DC

## 2018-07-21 MED ORDER — BACITRACIN 50,000 UNIT INTRAMUSCULAR SOLUTION
50000 | INTRAMUSCULAR | Status: AC
Start: 2018-07-21 — End: ?

## 2018-07-21 MED ORDER — SODIUM CHLORIDE 0.9 % INTRAVENOUS SOLUTION: 0.9 % | INTRAVENOUS | Status: DC

## 2018-07-21 MED ORDER — FENTANYL FOR ANESTHESIA
Status: DC | PRN
Start: 2018-07-21 — End: 2018-07-21
  Administered 2018-07-21 (×2): via INTRAVENOUS

## 2018-07-21 MED ORDER — LACTATED RINGERS INTRAVENOUS SOLUTION
INTRAVENOUS | Status: DC
Start: 2018-07-21 — End: 2018-07-23
  Administered 2018-07-22: 05:00:00 via INTRAVENOUS

## 2018-07-21 NOTE — Consults (Signed)
[facility]  Clinical Pharmacy  Vancomycin Per Pharmacy Initiation Note    Subjective:    Vancomycin per pharmacy has been initiated for dosing and monitoring of vancomycin for Casey Ali as empiric treatment of surgical prophylaxis with a goal trough of 10-20 mg/L.      Objective:    Estimated Creatinine Clearance: 103.6 mL/min (based on SCr of 0.53 mg/dL).    Temp (24hrs), Avg:36.4 C (97.6 F), Min:36.2 C (97.2 F), Max:36.8 C (98.2 F)    No results found for: WBC    Assessment/Plan:    Taking into account patients total body weight and renal function, plan to initiate vancomycin 1000 mg IV every Q12h. No vancomycin levels have been ordered at this time.    Pharmacy will monitor daily and adjust regimen as appropriate. Thank you for involving [place] in this patients care.    Zakyah Yanes, Pharm.D.  Inpatient Clinical Pharmacist

## 2018-07-21 NOTE — Progress Notes (Signed)
Neurosurgery Progress Note    ID  57 y.o. female w/ cervical radiculopathy and LUE pain/weakness s/p C3 and partial C7 lami, C4-6 laminiplasty, Left side C3-4, C4-5, C5-6 and C6-7 foraminotomies (07/21/18, Tan).    Hospital Course  07/21/18 OR, admit    Vitals  Temp:  [36.2 C (97.2 F)-36.8 C (98.2 F)] 36.5 C (97.7 F)  Heart Rate:  [83-125] 98  *Resp:  [12-19] 16  BP: (122-150)/(66-87) 138/69  Arterial Line BP (mmHg) : (144)/(78) 144/78  SpO2:  [97 %-100 %] 100 %    I/O  10/08 1901 - 10/09 1900  In: 2600 [I.V.:2600]  Out: 730 [Urine:550; Drains/NG:30]     Physical Exam  A+Ox3  PERRL   FS  LUE 4+, RUE 5, BLE 5  SILT    Imaging  No results found.    Labs  No results found for: WBC, HCT, PLT, NA, K, CL, CO2, HCO3, PT, PTT, INR, ESR, CRP    A/P:  Casey Ali [id number]/[id number]  57 y.o. female w/ cervical radiculopathy and LUE pain/weakness s/p C3 and partial C7 lami, C4-6 laminiplasty, Left side C3-4, C4-5, C5-6 and C6-7 foraminotomies (07/21/18, Tan). EXAM: LUE 4+, RUE 5, BLE 5  - location: floor  - imaging: post op AP/lat C spine: ordered  - pain: PCA  - meds of note: hold home ASA  - hemovac to suction:  - abx while drains in place  - PT/OT: ordered  - activity: OOB  - collar/brace: none  - diet: regular  - DVT ppx: SCDs      Contact between 8 am - 5 pm weekdays:  1st call: Spine NP - Baker Janus [phone] or Claiborne Rigg [phone]  2nd call: Resident - Rico Junker [phone]  Urgent/Emergent: HEAD Pager [phone] regardless of time of day    Contact between 5 pm - 8 am weekdays, anytime weekends  Page HEAD Pager [phone]

## 2018-07-21 NOTE — Brief Op Note (Signed)
Brief Operative Note    Surgeon(s) and Role:     * Tammi Klippel, MD - Primary     * Stanton Kidney, MD - Resident - Assisting    Date of Operation: Jul 22, 2018    Pre-Op Diagnosis Codes:     * Cervical myelopathy with cervical radiculopathy [M47.12]    Postoperative Diagnosis: same    Procedure(s) and Anesthesia Type:     * LAMINOPLASTY WITH PLATING, CERVICAL (Medtronic CenterPiece) - Anes-General    Findings: C3 and partial C7 laminectomy, C3-6 laminoplasty, left C3-7 foraminotomy    Fluids: 2.6L crystalloid    Estimated Blood Loss: 150cc    Wound Class:  1 - Clean    Incisional Closure Type: Primary closure (all tissues closed tightly or loosely, drains/hardware may exit)    Implants:   Implant Name Type Inv. Item Serial No. Manufacturer Lot No. LRB No. Used Action   PLATE CERVICAL CENTERPIECE OD LAT HOLE   853014 LH - [id number] Spine PLATE CERVICAL CENTERPIECE OD LAT HOLE   853014 LH    N/A 3 Implanted   SCREW BONE 2.6 X CENTERPIECE   853-467 - [id number] Spine SCREW BONE 2.6 X CENTERPIECE   853-467    N/A 6 Implanted   SCREW BONE 2.6 X CENTERPIECE   853-465 - [id number] Spine SCREW BONE 2.6 X CENTERPIECE   853-465    N/A 6 Implanted   SCREW BONE 3.0 X CENTERPIECE   853-505 - [id number] Spine SCREW BONE 3.0 X CENTERPIECE   853-505    N/A 2 Implanted        Specimens:   Specimens (From admission, onward)    None           Drains: hemovac x1     Disposition and Plan: PACU

## 2018-07-21 NOTE — Interval H&P Note (Signed)
ATTENDING SURGEON PREOPERATIVE NOTE  Casey Ali is a 57 y.o. female with the following pre-op diagnosis: Pre-Op Diagnosis Codes:     * Cervical myelopathy with cervical radiculopathy [M47.12]    Planned procedure: C3 and partial C7 lami, C4-6 laminiplasty, Left side C3-4, C4-5, C5-6 and C6-7 foraminotomies  Indications:  Neck and left arm pain, weakness    INTERVAL HISTORY AND PHYSICAL  The patient's history and physical were reviewed.  I have performed an appropriate, condition-specific physical examination today, and the indication still exists for surgery.     Physical exam done today: AAOx3, LUE 4+/5, RUE 5/5, BLE 5/5    DOCUMENTATION OF INFORMED CONSENT  I have discussed the risks, benefits, and alternatives of the procedure with the patient and/or the patient's medical decision-maker.  This discussion included, but was not limited to, the risk of bleeding, infection, damage to anatomical structures, need for reoperation, or even death.  The patient and/or the patient's medical decision maker understands, has had all of his/her questions answered, and desires to proceed.  Informed consent obtained.    SIMULTANEOUS OPERATIONS  If I am attending more than one operation simultaneously, this surgeon is my backup: n/a

## 2018-07-21 NOTE — Anesthesia Post-Procedure Evaluation (Signed)
Anesthesia Post-op Evaluation    Scheduled date of Operation: 07/21/2018    Scheduled Surgeon(s):Lee A Tan, MDThomas Quentin Ore, MD  Scheduled Procedure(s):LAMINOPLASTY WITH PLATING, CERVICAL (Medtronic CenterPiece)    Final Anesthesia Type: general    Assessment  Respiratory Function:      Airway Patency: Excellent      Respiratory Rate: See vitals below      SpO2: See vitals below      Overall Respiratory Assessment: Stable  Cardiovascular Function:      Pulse Rate: See Vitals Below      Blood Pressure: See Vitals Below      Cardiac status: Stable  Mental Status:      RASS Score: -1 Drowsy, Not fully alert, but has sustained (more than 10 seconds) awakening, with eye contact, to voice  Temperature: Normothermic  Pain Control: Adequate  Nausea and Vomiting: Absent  Fluids/Hydration Status: Euvolemic    Complications (anesthesia/case associated complications, possible complications, and/or significant issues; as of time of note completion: No apparent complications      Plan  Follow-up care: As per primary team    Post-op Note Status: Complete, patient participated in evaluation, which occurred after recovery from anesthesia but prior to 48 hours from end of case      OB Perinatal DB          Recent Pre-op and Post-op Vital Signs  Vitals:    07/21/18 0750   BP: 144/86   Pulse: 83   Resp: 18   Temp: 36.8 C (98.2 F)   TempSrc: Temporal   SpO2: 97%     Last Vital Signs Out of Room to Anesthesia Stop  Vitals Value Taken Time   Pulse 101 07/21/2018 12:33 PM   Resp 10 07/21/2018 12:33 PM   SpO2 100 07/21/2018 12:33 PM     Vitals Value Taken Time   BP     Arterial Line BP (mmHg)      Arterial Line MAP (mmHg)     Arterial Line 2 BP (mmHg)     Arterial Line 2 MAP (mmHg)     Temp     Temp src         Case Tracking Events:  Event Time In Time Out   In Facility 0626    In SWA 0633    In Pre-op 0721    Anesthesia Start 0842    Anesthesia Ready 0911    In Room 0842    Procedure Start 0948    Procedure Finish 1205    PACU Notified  1054    Out of Room 1221    Anesthesia Finish 1224

## 2018-07-21 NOTE — H&P (Signed)
INTERVAL HISTORY AND PHYSICAL and RISKS / BENEFITS NOTE    Planned procedure: C3 and partial C7 lami, C4-6 laminiplasty, Left side C3-4, C4-5, C5-6 and C6-7 foraminotomies  Indications:  Neck and left arm pain    INTERVAL HISTORY AND PHYSICAL  The patient's history and physical were reviewed.  The patient was examined today.  There are no changes in the patient's health history or physical findings since the previously recorded history and physical.    INTERVAL LABS  No results found for: HCT, PLT, INR, PTT    DOCUMENTATION OF INFORMED CONSENT  Risks, benefits, and alternatives of the procedure were discussed with the patient and/or the patient's medical decision-maker.  The patient and/or the patient's medical decision maker understands, has had all of his/her questions answered, and desires to proceed.  Informed consent obtained.

## 2018-07-21 NOTE — Op Note (Signed)
Operative Report - [facility]    Surgeon(s) and Role:     * Tammi Klippel, MD - Primary     * Stanton Kidney, MD - Resident - Assisting    Date of Operation: 2018-08-08    Pre-Op Diagnosis Codes:     * Cervical myelopathy with cervical radiculopathy [M47.12]    Postoperative Diagnosis: same    Procedure(s) and Anesthesia Type:     - Anes-General    1) Left C3-4, C4-5,  C5-6 and C6-7 posterior cerivlca foraminotomies   2) C3 and partial C7 laminectomies  3) C4-6 laminoplasty with mini-plates  4) Utilization of microscope  5) Utilization of Gardner-wells tongs    Clinical Indications   Left side headache due to high cervical radicuopathy, neck and left arm pain    Findings   Left cervical foraminal stenosis from C3-7, 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 Baseline neuromonitoring signals were obtained. Gardner-wells tongs were applied 1cm above the pinna. The patient was then turned carefully into the prone position on the [place] table and 15lbs were applied to the GW tongs to secure the head. All pressure points were checked and well-padded.     The posterior neck was shaved and a midline skin incision from C3 to C7 was marked using anatomical landmark. The neck area was then prepped and draped in the standard fashion.     The microscope was brought into the filed and used throughout the remainder of the procedure. The marked skin incision was opened with a #10 blade through the epidermal layer, and the monopolar cautery was used to carried out the subcutaneous dissection down to the spinous processes. Meticulous dissection technique was used and there was minimal tissue trauma and bleeding.      Further dissection was carried out using monopolar in a subperiosteal fashion. The exposure from bottom of C2 to top and C7  was obtained. Gelpi retractors were used to maintain the exposure.     Left side posterior cervical foraminotomies at C3-4, C4-5,  C5-6 and C6-7  were then performed with high-speed burr and up-going curette by removing lateran lamina and medial facet with good visualization and decompression of the nerve roots at each level.     A complete C3 laminecotomy was performed using the high speed burr. Partial C7 superior laminectomy was performed.    Then, a complete cut trough at the lamina-lateral mass junction was made on the left side from C4 to C6, and a partial depth trough was made on the right from C4-6. The laminae were then hinged open and secured in place with 14mm laminoplasty plates and screws. A burr was used to remove additional bone from the spinous process to prevent bony block during neck extension. This was checked intraoperatively with extending the patients neck with GW tongs. After the laminoplasty was completed, copious irrigation was used and hemostasis was achieved. A syringe with a angiocath tip was used to place 40mg  of depo-medrol in the epidural space under the remaining C7 lamina. Vancomycin 1g was then placed in the wound. A hemovac was placed.    The wound was then closed in a meticulous manner using vicryl sutures in multiple layers with attention to minimize tissue trauma and potential dead space. The skin was closed with 4-0 monocryl followed by  steristrips.  Dressing was applied and the drain was secured. All counts were correct at the end of the procedure and a debriefing session was done with the OR staff.    The patient was then turn supine onto a hospital bed and extubated without any issue. The patient was transferred to PACU in stable condition.        Estimated Blood Loss: 150cc    Specimens to Pathology: none    Condition at end of operation: stable    Disposition and Plan: PACU    The primary surgeon (listed above) was scrubbed and present throughout the entire surgical  procedure. If the assistant surgeon was other than a qualified resident, I certify that the services were medically necessary and there was no qualified resident available to perform the services.

## 2018-07-22 DIAGNOSIS — Z4789 Encounter for other orthopedic aftercare: Secondary | ICD-10-CM

## 2018-07-22 DIAGNOSIS — Z9889 Other specified postprocedural states: Secondary | ICD-10-CM

## 2018-07-22 DIAGNOSIS — M47812 Spondylosis without myelopathy or radiculopathy, cervical region: Secondary | ICD-10-CM

## 2018-07-22 LAB — COMPLETE BLOOD COUNT WITH DIFF
Abs Basophils: 0.02 10*9/L (ref 0.0–0.1)
Abs Eosinophils: 0.01 10*9/L (ref 0.0–0.4)
Abs Imm Granulocytes: 0.05 10*9/L (ref <0.1)
Abs Lymphocytes: 1.17 10*9/L (ref 1.0–3.4)
Abs Monocytes: 0.73 10*9/L (ref 0.2–0.8)
Abs Neutrophils: 6.68 10*9/L (ref 1.8–6.8)
Hematocrit: 33.4 % — ABNORMAL LOW (ref 36–46)
Hemoglobin: 11.1 g/dL — ABNORMAL LOW (ref 12.0–15.5)
MCH: 29.7 pg (ref 26–34)
MCHC: 33.2 g/dL (ref 31–36)
MCV: 89 fL (ref 80–100)
Platelet Count: 229 10*9/L (ref 140–450)
RBC Count: 3.74 10*12/L — ABNORMAL LOW (ref 4.0–5.2)
WBC Count: 8.7 10*9/L (ref 3.4–10)

## 2018-07-22 LAB — PROTHROMBIN TIME
Int'l Normaliz Ratio: 1.1 (ref 0.9–1.2)
PT: 14.1 s (ref 11.8–14.8)

## 2018-07-22 LAB — BASIC METABOLIC PANEL (NA, K,
Anion Gap: 8 (ref 4–14)
Anion Gap: 9 (ref 4–14)
Calcium, total, Serum / Plasma: 8.3 mg/dL — ABNORMAL LOW (ref 8.8–10.3)
Calcium, total, Serum / Plasma: 8.3 mg/dL — ABNORMAL LOW (ref 8.8–10.3)
Carbon Dioxide, Total: 25 mmol/L (ref 22–32)
Carbon Dioxide, Total: 25 mmol/L (ref 22–32)
Chloride, Serum / Plasma: 110 mmol/L — ABNORMAL HIGH (ref 97–108)
Chloride, Serum / Plasma: 109 mmol/L — ABNORMAL HIGH (ref 97–108)
Creatinine: 0.53 mg/dL (ref 0.44–1.00)
Creatinine: 0.55 mg/dL (ref 0.44–1.00)
Glucose, non-fasting: 123 mg/dL (ref 70–199)
Glucose, non-fasting: 147 mg/dL (ref 70–199)
Potassium, Serum / Plasma: 4.1 mmol/L (ref 3.5–5.1)
Potassium, Serum / Plasma: 3.5 mmol/L (ref 3.5–5.1)
Sodium, Serum / Plasma: 143 mmol/L (ref 135–145)
Sodium, Serum / Plasma: 143 mmol/L (ref 135–145)
Urea Nitrogen, Serum / Plasma: 9 mg/dL (ref 6–22)
Urea Nitrogen, Serum / Plasma: 9 mg/dL (ref 6–22)
eGFR - high estimate: >120 mL/min (ref >60)
eGFR - high estimate: >120 mL/min (ref >60)
eGFR - low estimate: 107 mL/min (ref >60)
eGFR - low estimate: 106 mL/min (ref >60)

## 2018-07-22 LAB — ACTIVATED PARTIAL THROMBOPLAST: Activated Partial Thromboplast: 25.2 s (ref 21.9–32.3)

## 2018-07-22 MED ORDER — BACLOFEN 10 MG TABLET
10 | Freq: Three times a day (TID) | ORAL | Status: DC
Start: 2018-07-22 — End: 2018-07-22
  Administered 2018-07-22: 18:00:00 5 mg via ORAL

## 2018-07-22 MED ORDER — ACETAMINOPHEN 500 MG TABLET
500 | Freq: Three times a day (TID) | ORAL | Status: DC
Start: 2018-07-22 — End: 2018-07-23
  Administered 2018-07-23 (×3): via ORAL

## 2018-07-22 MED ORDER — LORATADINE 10 MG TABLET
10 | Freq: Every day | ORAL | Status: DC
Start: 2018-07-22 — End: 2018-07-23
  Administered 2018-07-23: 16:00:00 via ORAL

## 2018-07-22 MED ORDER — BISACODYL 10 MG RECTAL SUPPOSITORY
10 | Freq: Once | RECTAL | Status: DC
Start: 2018-07-22 — End: 2018-07-23

## 2018-07-22 MED ORDER — HYDROMORPHONE (PF) 0.5 MG/0.5 ML INJECTION SYRINGE
0.5 | INTRAMUSCULAR | Status: DC | PRN
Start: 2018-07-22 — End: 2018-07-23

## 2018-07-22 MED ORDER — ACETAMINOPHEN 500 MG TABLET
500 | Freq: Three times a day (TID) | ORAL | Status: DC
Start: 2018-07-22 — End: 2018-07-22
  Administered 2018-07-22 (×2): via ORAL

## 2018-07-22 MED ORDER — GABAPENTIN 300 MG CAPSULE
300 | Freq: Three times a day (TID) | ORAL | Status: DC | PRN
Start: 2018-07-22 — End: 2018-07-23

## 2018-07-22 MED ORDER — HEPARIN, PORCINE (PF) 5,000 UNIT/0.5 ML INJECTION SOLUTION
5000 | Freq: Two times a day (BID) | INTRAMUSCULAR | Status: DC
Start: 2018-07-22 — End: 2018-07-23
  Administered 2018-07-23 (×2): via SUBCUTANEOUS

## 2018-07-22 NOTE — Interdisciplinary (Signed)
Fit and delivered a universal size Soft CO. Patient instructed on proper care and use of device. Further instruction to be delivered by medical team.  Left at patients bed side because she was not up to having the collar fit to her at the time of her visit.

## 2018-07-22 NOTE — Consults (Signed)
OCCUPATIONAL THERAPY EVALUATION / RE-EVALUATION     This note does not include all documentation from the Occupational Therapy session. For a full view of the OT documentation, please see the age appropriate OT Day by Day report found in the Index.    The following documentation is for a: OT Charting Type: Initial Evaluation    INPATIENT RECOMMENDATIONS    Inpatient Recommendations  Inpatient Recommendations: OOb to chair for meals, ambulate to bathroom     DISCHARGE RECOMMENDATIONS    Discharge Recommendations  Recommended Discharge Disposition: Home   Level of Independence Needed to Return to Baseline Disposition: Minimal assist  Anticipated Assistance Available at Baseline Disposition: Spouse/Significant other;Family  Barriers to Discharge: Medical issues;Insufficient physical ability  Patients current functional ability appropriate for D/C recommendations: No    Anticipated Assistance Available at Baseline Disposition: Spouse/Significant other;Family    Discharge DME Needs  Discharge DME Recommendations: No Occupational Therapy DME needs    ASSESSMENT AND TREATMENT PLAN    Occupational Therapy Assessment  OT Referral Received: Eval and Treat  Patient's Medical Diagnosis, Past Medical History, Medications and other OT pertinent information have been reviewed.: Yes  Diagnosis and brief medical history Comment: 57 y.o. female w/ cervical radiculopathy and LUE pain/weakness s/p C3 and partial C7 lami, C4-6 laminiplasty, Left side C3-4, C4-5, C5-6 and C6-7 foraminotomies   Maximal level of assist needed at time of Initial Evaluation : Minimal assist   Occupational Therapy Initial Assessment Summary: Pt is a 57 yo with medical course noted above.Pt lives in a 2 story house with FOS to access bedroom and bathroom. pt is independent in all aspects of self care and does not use assistive device. pt works full time and enjoys Archivistknitting, hiking and spending time with her family. Pt is currently limited in terms of  occupational performance by neck pain and was symptomatic in stand. limiting progress. Pt supporting head in LUE to decrease pain, but when unsupported, able to maintain head in midline but limited d/t pain. Pt requring assistance for bimanual tasks in functional upright posture but anticipate when pain is better controlled, will be able to complete basic self care without assistance. Pt's performance in stand and in activities at EOB suggests she will be able to ambulate to bathroom when BP tolerates. Education provided on compensatory strategies for ADLs. Anticipate pt will be able to discharge home with good family support. Pt would benefit from one follow up OT session while in-house.   Planned OT Interventions: Activities of Daily Living retraining;Joint protection education;Pain Managment;Patient/Family/Caregiver education;Functional transfer training;Energy conservation education  OT Prognosis for Goals: Good  OT Frequency: Eval and one follow up session  OT Duration: 2;Visits  Patient/Caregiver Agreeable to OT Plan of Care: Yes    OT Follow-up Assessment  OT Interventions Provided: Pt seen today for evaluation. Pt participated in ADL activities with education on compensatory strategies         OBJECTIVE FINDINGS AND INTERVENTIONS    Precautions  General Precautions: Universal precautions;Fall risk  Orthopedic/Spine Precautions: None  Medical/Surgical Precautions: None    Current Areas of Occupation Basic  Grooming and Light Hygiene: Minimal assistance  Where Assessed: Bed level;Edge of bed  Grooming and Light Hygiene Comment: at EOB, pt needing to support head witih LUE, needing assistance for bimanual tasks. Pt able to complete tasks at bed level with head supported against pillow. Pt has had experience with soft collar previously and found them ineffective  Self Feeding: Set up  Where Assessed - Self  Feeding: Bed level  Toileting Comment: orthostatic and symptomatic in stand limiting ability to ambulate to  bathroom. Pt's performance within these limitations suggests she will be able to ambulate to bathroom with min A  Lower Body Dressing Comment: Pt's performance limited by needing to support head with LUE, otherwise pain limiting performance.     Functional Transfers   Bed Mobility from: Supine  Bed Mobility to: Sitting EOB  Level of Assist for Bed Mobility: Minimal assistance  Transfer from: Bed;Sit  Transfer to: Stand;Sit;Bed  Transfer Level of Assistance: Contact guard;Minimal verbal cues    Cognition  Overall Cognitive Status: WFL's  Orientation Level: Oriented X4  Arousal/Alertness: Appropriate responses to stimuli  Attention Span: WFL's  Memory: WFL's  Follow Commands: WFL's  Safety Judgement: Good awareness of safety precautions                                                      Static Sitting Balance  Static Sitting Balance Impaired: No  Support Required for Balance: No UE support;Feet supported  Level of Assistance: Supervision    Dynamic Sitting Balance  Dynamic Sitting Balance Impaired: No  Support Required for Balance: No UE support  Level of Assistance: Supervision    Static Standing Balance  Static Standing Balance Impaired: Yes  Support Required for Balance: No UE support  Level of Assistance: Contact guard assist    Dynamic Standing Balance  Dynamic Standing Balance Impaired: Yes  Support Required for Balance: No UE support  Level of Assistance: Contact guard assist    Hand Dominance: Right                                 PRIOR LIVING DISPOSITION, LEVEL OF FUNCTION AND SOCIAL SUPPORT    Prior Living Environment   Residence Type Prior to Admit: House  Residence Layout: Two level  Prior Living Environment Comments: FOS to access bedroom and bathroom     Living Environment Detail  Bathroom Toilet: Standard  Bathroom Accessibility: Accessible    Equipment  Patient Owned Mobility Equipment: None  Patient Owned ADL/IADL Equipment : None    Prior Function  ADL's  General Level of Assistance:  Independent  Lives With: Family  Receives Help From: No one  Self-feeding: Independent  Grooming: Independent  Dressing: Independent  Toileting: Independent  Bathing: Independent  Prior Function Basic Comment: Pt lives with her husband and two teenage boys in a 2 story house with FOS to access bedroom and bathroom. Pt has been independent in all aspects of self care and does not use assistive device.     Prior Function IADL's  Vocation: Full time employment  Vocational Comments: faculty at [employer]  Instrumental ADL Comment: Pt and family share household tasks. Pt enjoys knitting, hiking spending with family.       Benjiman Core, OT    07/22/2018

## 2018-07-22 NOTE — Consults (Signed)
Neurosurgery Clinical Pharmacy Admission Note    ID: Casey Ali is a 57 y.o. female w/ cervical radiculopathy and LUE pain/weakness s/p C3 and partial C7 lami, C4-6 laminiplasty, Left side C3-4, C4-5, C5-6 and C6-7 foraminotomies (07/21/18, Tan).    CSN:  [id number]    Information provided by: patient    Allergies/Contraindications   Allergen Reactions    Shellfish Containing Products Anaphylaxis    Peanut Swelling     Low allergy but patient can eat 1/2 peanut and be ok    Pollen Extracts     Suture      Patient reports allergy to cat gut suture- reaction not specified.     Penicillins Hives and Rash       Medications Prior to Admission   Medication Sig Note    aspirin 325 mg buffered tablet Take 650 mg by mouth every 6 (six) hours as needed for Pain.      cyanocobalamin, Vitamin B12, 500 mcg tablet Take 500 mcg by mouth every other day.     ketotifen (ZADITOR) 0.025 % (0.035 %) ophthalmic solution Place 1 drop into both eyes 2 (two) times daily.     loratadine (CLARITIN ORAL) Take 5 mg by mouth daily as needed (allergies).  05/20/2018: PRN     Discrepancies: None    Preferred Pharmacy:    [id number], [place] - [address] AT [facility] OF [facility] [address]  [address]  [id number]  [place] [address]  Phone: [phone] Fax: [phone]      Discharge Pharmacy:  [id number], [place] - [address] AT [facility] [address] [facility] [address]  [address]  [id number]  [place] [address]  Phone: [phone] Fax: [phone]    Current Pain:  7/10 with movement in the back of the neck. Pt was unable to characterize the pain.      Notes: Pt states aspirin worked very well for pre-op pain. She could not recall effectiveness of other pain meds she had used in the past.    N/V: Had some nausea with movement in the early am but have since subsided  Void: Denies  Gas: Patient denies passing flatus  BM: Denies    Social history:    Caffeine: 2-3 cups of coffee per day  EtOH: 1 glass of wine per week  Tobacco: Denies  Cannabinoids: Denies    [X]  I have compared and reconciled the list of medications in the physician H&P and those prescribed at admission and I have the following recommendations:    Recommendations:  1) Continue home maintenance medications, except as noted; formulary substitution as clinically appropriate      - hold aspirin/NSAIDs until approved by surgeon to resume      - may resume OTC nutritional supplements on discharge    2) Pain management:      - acetaminophen 500 mg PO Q8H       - oxycodone 5-10 mg PO Q4H PRN moderate pain      - lidocaine 5% patch(es) for incisional pain > dc per patient's request      - gabapentin 300 mg PO TID > change to 300 mg TID PRN  3) Spasm management:      - Baclofen 5 mg PO Q6H PRN  4) Nausea management:       - Ondansetron 4 mg IV/PO Q6H PRN  5) Bowel regimen management:      -  Docusate 250 mg PO BID      - Senna 17.2 mg PO Q bedtime      - Polyethylene glycol (Miralax) 17 gm PO Daily      - Bisacodyl suppository 10 mg PR Q12H PRN if no bowel movement in 2 days      - Fleet enema PR Daily PRN constipation not relieved by other laxatives    Will follow    Zara Chess, PharmD  Clinical Pharmacist

## 2018-07-22 NOTE — Progress Notes (Signed)
Neurosurgery Progress Note    ID  57 y.o. female w/ cervical radiculopathy and LUE pain/weakness s/p C3 and partial C7 lami, C4-6 laminiplasty, Left side C3-4, C4-5, C5-6 and C6-7 foraminotomies (07/21/18, Tan).    Hospital Course  07/21/18 OR, admit    Vitals  Temp:  [36.2 C (97.2 F)-37 C (98.6 F)] 36.9 C (98.4 F)  Heart Rate:  [76-125] 76  *Resp:  [12-19] 18  BP: (93-150)/(59-87) 93/59  Arterial Line BP (mmHg) : (144)/(78) 144/78  SpO2:  [97 %-100 %] 99 %    I/O  10/09 0701 - 10/10 0700  In: 3092.4 [P.O.:240; I.V.:2602.4]  Out: 2886 [Urine:2650; Drains/NG:86]     Physical Exam  A+Ox3  PERRL   FS  LUE 4+, RUE 5, BLE 5  SILT    Imaging  No results found.    Labs  Lab Results   Component Value Date/Time    NA 143 07/21/2018 06:35 PM    K 4.1 07/21/2018 06:35 PM    CL 110 (H) 07/21/2018 06:35 PM    CO2 25 07/21/2018 06:35 PM       A/P:  Maricsa Sammons [id number]  57 y.o. female w/ cervical radiculopathy and LUE pain/weakness s/p C3 and partial C7 lami, C4-6 laminiplasty, Left side C3-4, C4-5, C5-6 and C6-7 foraminotomies (07/21/18, Tan). EXAM: LUE 4+, RUE 5, BLE 5  - location: floor  - imaging: post op AP/lat C spine: ordered  - pain: PCA  - meds of note: hold home ASA  - hemovac to suction: 86cc  - abx while drains in place  - PT/OT: ordered  - activity: OOB  - collar/brace: none  - diet: regular  - DVT ppx: SCDs  PLAN: wean PCA, cont drain, post op XR, SQH tonight, PT/OT    Contact between 8 am - 5 pm weekdays:  1st call: Spine NP - Lemmie Evens [phone] or Townsend Roger [phone]  2nd call: Resident - Nyra Capes [phone]  Urgent/Emergent: HEAD Pager [phone] regardless of time of day    Contact between 5 pm - 8 am weekdays, anytime weekends  Page HEAD Pager [phone]

## 2018-07-22 NOTE — Consults (Signed)
PHYSICAL THERAPY CANCELED SESSION NOTE     Charting Type: Initial Evaluation  Cancellation: Cancelled session  PT session missed for the following reasons (S): Patient requests deferring treatment  Missed Reason Comment: pt deferring session 2/2 pain and fatigue. Will attempt eval tomorrow as appropriate.    Crist Infante [place], PT  07/22/2018

## 2018-07-23 ENCOUNTER — Inpatient Hospital Stay: Admit: 2018-07-23 | Discharge: 2018-07-23 | Payer: BLUE CROSS/BLUE SHIELD

## 2018-07-23 LAB — BASIC METABOLIC PANEL (NA, K,
Anion Gap: 7 (ref 4–14)
Calcium, total, Serum / Plasma: 8.2 mg/dL — ABNORMAL LOW (ref 8.8–10.3)
Carbon Dioxide, Total: 24 mmol/L (ref 22–32)
Chloride, Serum / Plasma: 110 mmol/L — ABNORMAL HIGH (ref 97–108)
Creatinine: 0.55 mg/dL (ref 0.44–1.00)
Glucose, non-fasting: 120 mg/dL (ref 70–199)
Potassium, Serum / Plasma: 3.9 mmol/L (ref 3.5–5.1)
Sodium, Serum / Plasma: 141 mmol/L (ref 135–145)
Urea Nitrogen, Serum / Plasma: 8 mg/dL (ref 6–22)
eGFR - high estimate: >120 mL/min (ref >60)
eGFR - low estimate: 106 mL/min (ref >60)

## 2018-07-23 MED ORDER — ACETAMINOPHEN 500 MG TABLET
500.00 mg | ORAL_TABLET | ORAL | Status: DC
Start: 2018-07-23 — End: 2018-07-23

## 2018-07-23 MED ORDER — DOCUSATE SODIUM 250 MG CAPSULE
250.00 mg | ORAL_CAPSULE | ORAL | Status: DC
Start: 2018-07-23 — End: 2018-07-23

## 2018-07-23 MED ORDER — AZELASTINE 0.05 % EYE DROPS
1.00 [drp] | OPHTHALMIC | Status: DC
Start: 2018-07-23 — End: 2018-07-23

## 2018-07-23 MED ORDER — HYDROMORPHONE (PF) 0.5 MG/0.5 ML INJECTION SYRINGE
0.20 mg | INJECTION | INTRAMUSCULAR | Status: DC
Start: ? — End: 2018-07-23

## 2018-07-23 MED ORDER — SENNOSIDES 8.6 MG TABLET
17.20 mg | ORAL_TABLET | ORAL | Status: DC
Start: 2018-07-23 — End: 2018-07-23

## 2018-07-23 MED ORDER — SODIUM CHLORIDE 0.9 % (FLUSH) INJECTION SYRINGE
3.00 mL | INJECTION | INTRAMUSCULAR | Status: DC
Start: ? — End: 2018-07-23

## 2018-07-23 MED ORDER — BACLOFEN 10 MG TABLET
5.00 mg | ORAL_TABLET | ORAL | Status: DC
Start: ? — End: 2018-07-23

## 2018-07-23 MED ORDER — POLYETHYLENE GLYCOL 3350 17 GRAM ORAL POWDER PACKET
17.00 g | ORAL | Status: DC
Start: 2018-07-24 — End: 2018-07-23

## 2018-07-23 MED ORDER — OXYCODONE 5 MG TABLET
5.00 mg | ORAL_TABLET | ORAL | Status: DC
Start: ? — End: 2018-07-23

## 2018-07-23 MED ORDER — LORATADINE 10 MG TABLET
10.00 mg | ORAL_TABLET | ORAL | Status: DC
Start: 2018-07-24 — End: 2018-07-23

## 2018-07-23 MED ORDER — NALOXONE 0.4 MG/ML INJECTION SOLUTION
0.10 mg | INTRAMUSCULAR | Status: DC
Start: ? — End: 2018-07-23

## 2018-07-23 MED ORDER — SODIUM PHOSPHATES 19 GRAM-7 GRAM/118 ML ENEMA
1.00 | ENEMA | RECTAL | Status: DC
Start: ? — End: 2018-07-23

## 2018-07-23 MED ORDER — GABAPENTIN 300 MG CAPSULE
300.00 mg | ORAL_CAPSULE | ORAL | Status: DC
Start: ? — End: 2018-07-23

## 2018-07-23 MED ORDER — SODIUM CHLORIDE 0.9 % (FLUSH) INJECTION SYRINGE
3.00 mL | INJECTION | INTRAMUSCULAR | Status: DC
Start: 2018-07-23 — End: 2018-07-23

## 2018-07-23 MED ORDER — HEPARIN, PORCINE (PF) 5,000 UNIT/0.5 ML INJECTION SOLUTION
5000.00 [IU] | INTRAMUSCULAR | Status: DC
Start: 2018-07-23 — End: 2018-07-23

## 2018-07-23 MED ORDER — GENERIC EXTERNAL MEDICATION
4.00 mg | Status: DC
Start: ? — End: 2018-07-23

## 2018-07-23 MED ORDER — GENERIC EXTERNAL MEDICATION
5.00 mg | Status: DC
Start: ? — End: 2018-07-23

## 2018-07-23 MED ORDER — CYANOCOBALAMIN (VIT B-12) 500 MCG TABLET
500.00 ug | ORAL_TABLET | ORAL | Status: DC
Start: 2018-07-23 — End: 2018-07-23

## 2018-07-23 MED ORDER — OXYCODONE 5 MG TABLET
5 | ORAL_TABLET | ORAL | 0 refills | Status: DC | PRN
Start: 2018-07-23 — End: 2018-07-23

## 2018-07-23 MED ORDER — SENNOSIDES 8.6 MG TABLET
8.6 | Freq: Every day | ORAL | Status: AC
Start: 2018-07-23 — End: ?

## 2018-07-23 MED ORDER — ACETAMINOPHEN 325 MG TABLET
325 | Freq: Three times a day (TID) | ORAL | Status: AC
Start: 2018-07-23 — End: ?

## 2018-07-23 MED ORDER — OXYCODONE 5 MG TABLET
5 | ORAL_TABLET | ORAL | 0 refills | Status: AC | PRN
Start: 2018-07-23 — End: ?

## 2018-07-23 MED ORDER — POLYETHYLENE GLYCOL 3350 17 GRAM ORAL POWDER PACKET
17 | PACK | Freq: Every day | ORAL | 2 refills | Status: AC
Start: 2018-07-23 — End: ?

## 2018-07-23 MED ORDER — GABAPENTIN 300 MG CAPSULE
300 | ORAL_CAPSULE | Freq: Three times a day (TID) | ORAL | 1 refills | Status: AC | PRN
Start: 2018-07-23 — End: ?

## 2018-07-23 MED ORDER — ASPIRIN 325 MG TABLET,DELAYED RELEASE
325 | ORAL | Status: DC | PRN
Start: 2018-07-23 — End: 2018-07-23

## 2018-07-23 MED ORDER — ASPIRIN 325 MG TABLET,DELAYED RELEASE: 325 mg | ORAL | Status: AC | PRN

## 2018-07-23 MED ORDER — BACLOFEN 10 MG TABLET
10 | ORAL_TABLET | Freq: Four times a day (QID) | ORAL | 0 refills | Status: AC | PRN
Start: 2018-07-23 — End: ?

## 2018-07-23 MED ORDER — NALOXONE 4 MG/ACTUATION NASAL SPRAY
4 | Freq: Once | NASAL | 0 refills | Status: AC | PRN
Start: 2018-07-23 — End: ?

## 2018-07-23 MED ORDER — ONDANSETRON HCL 4 MG TABLET
4 | ORAL_TABLET | Freq: Four times a day (QID) | ORAL | 0 refills | Status: AC | PRN
Start: 2018-07-23 — End: ?

## 2018-07-23 MED ORDER — FLU VACCINE QS 2019-20(6MOS UP)(PF) 60 MCG(15 MCGX4)/0.5 ML IM SYRINGE
60 | Freq: Once | INTRAMUSCULAR | Status: DC
Start: 2018-07-23 — End: 2018-07-23

## 2018-07-23 NOTE — Progress Notes (Signed)
Neurosurgery Progress Note    ID  57 y.o. female w/ cervical radiculopathy and LUE pain/weakness s/p C3 and partial C7 lami, C4-6 laminiplasty, Left side C3-4, C4-5, C5-6 and C6-7 foraminotomies (07/21/18, Tan).    Hospital Course  07/21/18 OR, admit    Vitals  Temp:  [36.5 C (97.7 F)-37.4 C (99.3 F)] 37.1 C (98.7 F)  Heart Rate:  [73-93] 78  *Resp:  [16-18] 16  BP: (98-146)/(58-83) 137/80  SpO2:  [95 %-99 %] 95 %    I/O  10/10 0701 - 10/11 0700  In: 730 [P.O.:480]  Out: 42 [Urine:2; Drains/NG:40]     Physical Exam  A+Ox3  PERRL   FS  LUE 4+, RUE 5, BLE 5  SILT    Imaging  No results found.    Labs  Lab Results   Component Value Date/Time    WBC 8.7 07/22/2018 05:58 AM    HCT 33.4 (L) 07/22/2018 05:58 AM    PLT 229 07/22/2018 05:58 AM    NA 141 07/22/2018 05:06 PM    K 3.9 07/22/2018 05:06 PM    CL 110 (H) 07/22/2018 05:06 PM    CO2 24 07/22/2018 05:06 PM    PT 14.1 07/22/2018 05:58 AM    PTT 25.2 07/22/2018 05:58 AM    INR 1.1 07/22/2018 05:58 AM       A/P:  Koa Palla [id number]  57 y.o. female w/ cervical radiculopathy and LUE pain/weakness s/p C3 and partial C7 lami, C4-6 laminiplasty, Left side C3-4, C4-5, C5-6 and C6-7 foraminotomies (07/21/18, Tan). EXAM: LUE 4+, RUE 5, BLE 5  - location: floor  - imaging: post op AP/lat C spine: complete  - meds of note: hold home ASA  - hemovac to suction: 40cc  - abx while drains in place  - PT/OT: ordered  - activity: OOB  - collar/brace: none  - diet: regular  - DVT ppx: SCDs, SQH  PLAN: DC drain, PT/OT, dispo home today    Contact between 8 am - 5 pm weekdays:  1st call: Spine NP - Lemmie Evens [phone] or Townsend Roger [phone]  2nd call: Resident - Nyra Capes [phone]  Urgent/Emergent: HEAD Pager [phone] regardless of time of day    Contact between 5 pm - 8 am weekdays, anytime weekends  Page HEAD Pager [phone]

## 2018-07-23 NOTE — Discharge Instructions (Signed)
WHAT TO DO AFTER NEUROLOGICAL SPINE SURGERY    Caring for your Incision (Site of your surgery)  - Inspect and check the dressing/bandage daily for signs of infection.  If needed, contact your doctor as noted below.    Bandages/Dressings:   - Keep the dressings on your incision clean and dry.   - Change the dressing if your surgeon told you to, or if it gets wet or soiled.   - If  Changing the dressings:  o Wash your hands well with soap and water before touching the dressings.  o Remove the dressing carefully. If you need to, soak some of the dressing with sterile water or saline to help loosen it.  o Apply a new dressing every day or every other day.    If you have dissolvable Sutures with steri-strips on your incision:  - On day 10 after your surgery, you may remove the dressing and leave the incision open to the air, unless instructed otherwise by your doctor.  - Keep the wound dry for 14 days.  You may get the incision wet after 14 days but do not soak in water for 4 weeks after surgery (including swimming pools, Jacuzzis, or bathtubs). The steristrips will fall off by themselves and the sutures underneath will absorb as the wound heals.         Pets   - Do not allow pets to sleep with you until your wound is completely healed and the stitches/staples are removed.   - Do not allow pets to lick you or your wounds  - Cover sofas or chairs with a clean sheet before sitting or lying on them.   - Wash your hands immediately with soap after touching your pet.    Showering   - Do not soak the wound in a bathtub, a spa, or enter swimming pool or ocean for at least 4 weeks after surgery  - If you are not able to securely cover the dressing, do not shower. Try sponge or towel bathing instead.  - If you can keep your wound and dressing dry, you may start showering.  - Securely cover the dressing with plastic food wrap or a freezer bag with edges secured tightly using medical tape to prevent water entry.  - Medical tape can  be bought at any local drugstore  - After showering, remove the plastic wrap. If the dressing has become damp, change it to a new dressing.  - If your incision gets wet, be sure to PAT DRY, or use a blow dryer on COOL setting to air dry, but DO NOT RUB.    Pain:  - It is normal to have some discomfort or pain at the surgical site during activity and at night for a few weeks.   - Using an icepack for 10 to 15 minutes may relieve pain at the surgical site.   - Take medicines as told by your doctor.   - Rest in a comfortable position as told by your doctor.   - Check with your caregiver if the pain gets worse or does not go away as expected.    Contact your doctor immediately if any of the following happens:  - Increased redness, swelling, pain, discharge or warmth around the incision   - Incision opens  - Fluid drains from pin sites or incision   - Foul-smelling discharge from the incision  - Temperature higher than 101F (38.3 C)  - Shaking, chills  - Severe or increasing   pain that does not get better with rest or pain medications   - New or increased numbness or weakness in arms, legs, or torso   - Difficulty speaking or swallowing, arm weakness, facial droop  - Difficulty emptying your bladder or controlling your bowels  - Burning or pain on urination  - New shortness of breath/chest pain (Go to Emergency Room)    For 6 weeks after surgery:   Activities after surgery:   Do not lift, pull, or push more than 5-10 lbs.    Do not bend or twist your back    Gradually increase amount of time for sitting, standing and walking    You may walk for 5-15 minutes three times a day     Medications   A pharmacist or nurse will discuss your medications with you    If you are taking strong pain medications or muscle relaxants, please avoid driving and operating heavy machinery. If your pain is manageable, do not stop your medications abruptly - wean off your medications over weeks.    Pain medications may cause constipation.  To prevent constipation, drink a lot of water, eat a lot of fruits and vegetables and use stool softeners/laxatives as prescribed    FOR REFILLS: Call pharmacy or spine clinic coordinator at least one week in advance for medication refills    No refills are done on the weekend    Remember your surgeon will only prescribe medications to you up to six weeks after surgery, starting August 1st, 2019. You will then have to go to your primary care provider or pain provider for pain medications     Narcotic prescriptions, such as Oxycontin, Oxycodone and Percocet require an MD written prescription that must be hand carried to a pharmacy   DO NOT take ibuprofen (Advil/Motrin), naproxen (Aleve), celecoxib (Celebrex), other non-steroidal anti-inflammatory drugs (NSAIDs), or more than 81 mg per day of aspirin until your surgeon  approves. Acetaminophen (Tylenol) is okay for mild/minor pain.   DO NOT take any medications for osteoporosis (e.g., Fosamax) without discussing with your spine surgeon.   Calcium (1000-1500 mg daily) and vitamin D (1000-2000 units) are recommended to promote bone healing, either in the diet (e.g., skim milk, yogurt) and/or as nutritional supplements. These may be purchased without a prescription.    Driving   Depending on your individual surgery, you may resume driving 2-6 weeks after surgery    Pain medications may impair your ability to drive safely    It is OK to ride in a car as a passenger    If you have a long ride, ask your driver to stop every 45-50 minutes. Get out of the car for 5 minutes to stand or walk, and then continue the ride        Follow up appointments   Call as soon as possible for your follow-up appointments with your surgeon    Schedule your first appointment 10-14 days after surgery at the [facility], to have your incision site checked by an NP or MD. Otherwise you can schedule this appointment with your local primary care provider if you live far    The  second appointment will be 6-8  weeks after surgery, where your surgeon will discuss your return to work, physical therapy and gradual increase in activities    Questions  Call the neurosurgery spine clinic nurse practitioners for health problems and concerns or refills, during business hours, Monday to Friday 8:00 am to 4:00 pm              Surgeon's Clinic Coordinators:  [phone] Dr. Ames patients  [phone] Dr. Mummanenis patients   [phone] Dr. Chous patients   [phone] Dr. Dhalls patients  [phone] Dr. Quattrochi's patients  [phone] Dr. Clark's patients  [phone] Dr. Tan's patients    After hours/weekends: Call [phone] or [phone]        Using your Electronic Medical Record:    With [facility] MyChart, you can obtain online access to your lab tests, view many of your medical records, stay in touch with your provider, request appointments, view recent and upcoming appointments as well as request medication refills. Unlike regular email, the information is safe password-protected via an encrypted connection. It is easy to get started  just ask us and we will provide you with an activation code. To get signed up for [facility] MyChart, please email [email or web address]     Also, please note that only non-urgent patient related electronic communication should be directed to [facility] MyChart. [facility] MyChart is checked at least every 24-48 hours during regular business days. If you have an urgent or after hours issue that cannot wait until the next business day to be addressed, please call the office at [phone]. Thank you for your cooperation.      Other Considerations:  Do not smoke cigarettes or use nicotine-containing products (e.g., nicotine gum and nicotine patches). Limit or avoid exposure to second-hand smoke.      For more information visit our website: [email or web address]

## 2018-07-23 NOTE — Interdisciplinary (Signed)
CM Progress Notes    Per H&P: 57 y.o. female w/ cervical radiculopathy and LUE pain/weakness s/p C3 and partial C7 lami, C4-6 laminiplasty, Left side C3-4, C4-5, C5-6 and C6-7 foraminotomies (07/21/18, Tan).    Spouse by bed side. Lives with spouse does not use any DME. Independent with ADL. Spouse will transport.     As a result of interdisciplinary rounds and chart review this patient has been identified as having no Case Management / discharge needs at this time.  CM will continue to follow for any changes to plan.    Briant Cedar RN CM   [place]: [phone]

## 2018-07-23 NOTE — Consults (Signed)
Physical Therapy Problem Focused Evaluation Note      PROBLEM FOCUSED EVALUATION    Charting Type: Initial Evaluation  Treatment Number: 1  Patient's Medical Diagnosis, Past Medical History, Medications and other PT pertinent information have been reviewed.: Yes  Medical Review Comment: 57 y.o. female w/ cervical radiculopathy and LUE pain/weakness s/p C3 and partial C7 lami, C4-6 laminiplasty, Left side C3-4, C4-5, C5-6 and C6-7 foraminotomies (07/21/18, [place]).    Prior Level of Function- Information attained from  : Chart;Patient  Patient is functioning at or near baseline level of function: Yes  Exceptions are as noted (objective findings): At baseline, pt lives with her husband and 2 teenage sons in a 2 story home with FOS to the level of the bedroom/bathroom.  No equipment in the home.  She was independent at baseline for community distances, no assist needed for ADLs, sharing iADLs with her husband.  She is able to drive and denies falls.  Pt works as a Airline pilot at [employer].  Enjoy knitting and hiking at baseline.  She will be staying locally with her husband in a hotel that does not have any stairs.       ASSESSMENT    Assessment: Patient tolerated PT treatment session well and was able to perform all functional mobility with assistance from her husband at [facility] level assist.  Patient demonstrates understanding of her cervical precautions, does not wish to use the soft collar (for comfort) as it creates more pain.  Pt instructed in cervical precautions and technquefor bed mobility, transfers, and gait.  She was able to ambulate without use of AD x200 ft at SBA level with guarded gait.  Instructed also in stair climbing technique but did not wish to try right now as she will be discharging to a hotel with elevator access.  Overall this patient is safe to dc home with assistance from her husband as needed with follow up with OPPT when clear by MD.     Currently in Pain  Currently in Pain: Yes  Pain Location:  Neck;Shoulder;Upper Arm  Pain Description: Acute;Surgical  Pain Onset: Continuous  Pain Alleviated By: Medication;Rest;Repositioning  Pain Aggravated By: Movement  Other Pain Descriptor/Comments: increased pain when not supporting head         Physical Therapist Global Assessment of Mobility  Mobility Score: 8: Walking 5-200 ft with any device or any level of assistance    [employer] AM-PAC "6 Clicks" Basic Mobility Outcome Measure  Difficulty turning over in bed (including adjusting bedclothes, sheets and blankets)?: A Little  Difficulty sitting down on and standing up from a chair with arms (e.g., wheelchair, bedside commode, etc,.)?: A Little  Difficulty moving from lying on back to sitting on the side of the bed? : A Little  Help needed moving to and from a bed to chair (including a wheelchair)?: None  Help needed walking in hospital room?: A Little  Help needed climbing 3-5 steps with a railing? : A Little  AM-PAC "6 Clicks" Basic Mobility Score: 19      RECOMMENDATIONS    Plan: rec return home with assist from husband as needed, follow up with OPPT when medically clear by PT    Physical Therapy Discharge Disposition Recommendation: Home;OP PT         PT recommendations for mobilization with nursing: HHA or SBA for ambulation, no AD    Assist Level: one person    Device: None        Campbell Stall,  PT    07/23/2018

## 2018-07-23 NOTE — Plan of Care (Signed)
Patient's VSS, pain controlled, mobilizing well with inpatient rehab, tolerating PO intake, and passed a BM. Plan for discharge to home with husband (staying locally for 6 days post-op). Patient and husband agree(s) with plan.    Discharge neurosurgical spine surgery teaching reviewed w/ patient. Post-op course, activity, meds, precautions, hygiene, bowel care, signs and symptoms to be on the lookout for, and incision care discussed. All questions answered.    Steristrips intact. No redness swelling or drainage seen. Incision should be checked 2 weeks after surgery date. Instructed patient to return to [facility] for follow-up with post-operative check within 6-8 weeks. Dressing supplies provided and technique reviewed with patient and husband.

## 2018-07-23 NOTE — Consults (Signed)
OCCUPATIONAL THERAPY PROGRESS NOTE     This note does not include all documentation from the Occupational Therapy session. For a full view of the OT documentation, please see the age appropriate OT Day by Day report found in the Index.    The following documentation is for a: OT Charting Type: Treatment    INPATIENT RECOMMENDATIONS    Inpatient Recommendations  Inpatient Recommendations: OOb to chair for meals, ambulate to bathroom     DISCHARGE RECOMMENDATIONS    Discharge Recommendations  Recommended Discharge Disposition: Home   Level of Independence Needed to Return to Baseline Disposition: Minimal assist  Anticipated Assistance Available at Baseline Disposition: Spouse/Significant other;Family  Barriers to Discharge: Medical issues  Patients current functional ability appropriate for D/C recommendations: Yes    Anticipated Assistance Available at Baseline Disposition: Spouse/Significant other;Family    Discharge DME Needs  Discharge DME Recommendations: No Occupational Therapy DME needs    ASSESSMENT AND TREATMENT PLAN         OT Follow-up Assessment  OT Interventions Provided: pt education, home safety/accessability training  OT Progress Summary: Pt received supine in bed at start ofsession with husband at bedside. Pt and husband receptive to therapy intervention and training today. OT educated pts husband on hands on technqiue to assist with bed mobility wiht positive response from pt. OT instructed pt on home accessability and safety with young children. Pt able to verbalize understanding of education adn able to verbalize good insight into abilities and limitations. All OT related questions and concerns regarding self care, home care, and caregiver assist addressed. Recommend pt DC Home with family support when medically cleared.   Progress with OT: Good progress  Current maximal level of assist needed: Minimal assist  Current OT Assessment/Plan of Care still applicable: Yes (continue with POC)  OT  Follow-up Assessment Summary: No further skilled OT indicated         OBJECTIVE FINDINGS AND INTERVENTIONS    Precautions  Precautions/WB  Comment: no brace    Current Areas of Occupation Basic  Grooming and Light Hygiene: Minimal assistance  Where Assessed: Standing at [employer]  Self Feeding: Modified independent  Where Assessed - Self Feeding: Chair  Toileting level of assist: Contact guard assistance  Where Assessed - Toileting: Toilet  Upper Body Dressing: Minimal assistance  Where Assessed - Upper Body Dressing: Standing  Lower Body Dressing: Minimal assistance  Where Assessed - Lower Body Dressing: Chair  Lower Body Dressing Comment: husband able to assist    Functional Transfers   Bed Mobility from: Supine  Bed Mobility to: Sitting EOB  Level of Assist for Bed Mobility: Minimal assistance  Transfer from: Bed;Sit  Transfer to: Stand;Sit;Bed  Transfer Level of Assistance: Contact guard  Transfer - Technique: Walk  Functional Transfer Comment: ambulated in room without assist device                 Static Standing Balance  Static Standing Balance Impaired: No    Dynamic Standing Balance  Dynamic Standing Balance Impaired: Yes  Level of Assistance: Contact guard assist;Supervision            Oren Beckmann, OT    07/23/2018

## 2018-07-23 NOTE — Consults (Signed)
CLINICAL PHARMACY DISCHARGE NOTE       Service: Neurospine    Patient Name: Casey Ali  MRN: [id number]  CSN: [id number]    Admit Date: 05/23/2015  Discharge Date:     Allergies/Contraindications   Allergen Reactions    Shellfish Containing Products Anaphylaxis    Peanut Swelling     Low allergy but patient can eat 1/2 peanut and be ok    Pollen Extracts     Suture      Patient reports allergy to cat gut suture- reaction not specified.     Penicillins Hives and Rash       Patient being discharged to: Home    Pharmacy:     [id number] [place], [place] - [address] AT [facility] [address] [facility] [address]  [address]  [id number]  [place] [address]  Phone: [phone] Fax: [phone]    [id number] - [address] [place], [place] - [address]  [address]  [place] [address]  Phone: [phone] Fax: [phone]        Medications Upon Discharge:      Daily Medication List      Take these medications at their scheduled times      Comments   acetaminophen 325 mg tablet  Commonly known as:  TYLENOL  Notes to patient:  FOR: mild pain or fever. Do not take more than 4000 mg of acetaminophen per day in all medicines containing this ingredient. Avoid alcohol         Instructions:  Take 2 tablets (650 mg total) by mouth 3 (three) times daily. For 2 weeks. Then take every 8 hours as needed for mild pain     cyanocobalamin (Vitamin B12) 500 mcg tablet  Notes to patient:  FOR: dietary vitamin supplementation. Take with food to decrease stomach upset         Instructions:  Take 500 mcg by mouth every other day.     ketotifen 0.025 % (0.035 %) ophthalmic solution  Commonly known as:  ZADITOR  Notes to patient:  Wash hands thoroughly before and after each use. Do not touch dropper to any surface, including the eye. If you use more than one type of eye drop, wait 5-10 minutes between each medication.         Instructions:  Place 1 drop  into both eyes 2 (two) times daily.     polyethylene glycol 17 gram packet  Commonly known as:  MIRALAX  Notes to patient:  FOR: Constipation. Dissolve powder in 8 ounces of water, juice, cola, or tea. Do not take if you are experiencing loose stools         Instructions:  Take 1 packet (17 g total) by mouth Daily. HOLD for loose stool     senna 8.6 mg tablet  Commonly known as:  SENOKOT  Notes to patient:  FOR: constipation. Hold if you have loose stools. Drink plenty of fluids.These are available over the counter         Instructions:  Take 2 tablets (17.2 mg total) by mouth nightly at bedtime. HOLD for loose stool        Take these medications as needed      Comments   aspirin 325 mg EC tablet  Start taking on:  07/29/2018  Notes to patient:  Hold for 7 days from date of surgery. May resume on  07/29/18.         Instructions:  Take 1 tablet (325 mg total) by mouth every 6 (six) hours as needed for Pain.     baclofen 10 mg tablet  Commonly known as:  LIORESAL  Notes to patient:  Take with food or milk. May cause drowsiness, dizziness or muscular weakness. Avoid alcohol. If you have been taking regularly/daily for more than a few weeks, do not stop it abruptly but gradually taper off         Instructions:  Take 0.5-1 tablets (5-10 mg total) by mouth every 6 (six) hours as needed (muscle spasm).     CLARITIN ORAL  Notes to patient:  For: Allergies, itching or sleep. May cause drowsiness, dizziness or dry mouth         Instructions:  Take 5 mg by mouth daily as needed (allergies).     gabapentin 300 mg capsule  Commonly known as:  NEURONTIN  Notes to patient:  FOR: nerve pain. May cause drowsiness, dizziness, leg swelling or weight gain. Do not stop this medicine abruptly, but slowly taper to lowest effective dose once your pain improves         Instructions:  Take 1 capsule (300 mg total) by mouth 3 (three) times daily as needed (shooting pain).     naloxone 4 mg/actuation Spraynaero  Notes to patient:  FOR: Opioid  reversal (if too drowsy/unresponsive or experiencing slowed or labored breathing). May repeat every 2 to 3 minutes in alternating nostrils until medical assistance becomes available. Use as instructed. If used, 911 should be called for further medical care.         Instructions:  1 spray by Nasal route once as needed (suspected overdose). Call 911. Repeat if needed     ondansetron 4 mg tablet  Commonly known as:  ZOFRAN  Notes to patient:  FOR: nausea. May cause headache or dizziness         Instructions:  Take 1 tablet (4 mg total) by mouth every 6 (six) hours as needed for Nausea.     oxyCODONE 5 mg tablet  Commonly known as:  ROXICODONE  Notes to patient:  May cause drowsiness, dizziness, stomach upset or constipation. Avoid alcohol. May cause physical or psychological dependence. It is recommended that you keep naloxone (a reversal agent) on hand at home in case you ever respond too strongly to this medicine, such as reduced breathing depth or frequency.          Instructions:  Take 1-2 tablets (5-10 mg total) by mouth every 4 (four) hours as needed (Moderate to severe pain).  Additional Comments:  ICD 10 [G89.18]           Patient states she has adequate quantities of other medicines at home.    Reviewed discharge medications with patient. Patient understands appropriate use of medication and potential side effects; is aware of habituating potential of some medications, including opiates. Provided with written medication information and instructions, as well as instructions on how to obtain medications.    Patient instructed on guidelines for tapering analgesic medicines by either reducing the number of tablets taken per day or by increasing the interval between doses.    It was recommended to separate sedating medicines (e.g., muscle relaxants, opioids, sedative, hypnotics) by at least 1-2 hours, if possible.    Patient advised to avoid NSAIDs or medications that affect osteogenesis and/or hemostasis  until Tammi Klippel, MD gives approval for their use. Patient counseled  to avoid nicotine products/exposure.    Patient cautioned that pet hair or dander can transmit infection until wound is completely healed, and advised to keep pets off of bedding or furniture until staples or stitches are removed.    CS Rx provided for oxycodone 5 mg qty 100 (340b pricing $20) Rx printed and sent to [facility]. Baclofen, gabapentin, naloxone, ondansetron prescription(s) sent to pharmacy ([facility] in [place]) as specified above.   Patient was very satisfied with her hospitalization    *I have compared the pre-admission medication list to the discharge medications and any differences have been reconciled.      *Reviewed discharge medications with patient. Patient understands appropriate use of medication and potential side effects. All questions were answered.     CURES database was reviewed and consistent with expected prescribing.    I reviewed the patients substance use history as well as current medication list.  Patient was prescribed naloxone. Use of naloxone was reviewed with the patient and/or caregiver and educational materials were provided.    Zara Chess, PharmD  Clinical Pharmacist
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
## 2018-07-25 NOTE — Telephone Encounter (Signed)
I received a call from Adele Schilder:   89F w/ cervical radiculopathy and LUE pain/weakness s/p C3 and partial C7 lami, C4-6 laminiplasty, Left side C3-4, C4-5, C5-6 and C6-7 foraminotomies (07/21/18)    She called to confirm it was okay to start taking ASA POD#7, and l confirmed this after seeing your email chain documenting as much.  She asked about Ibuprofen or NSAIDS and I said also okay to start at POD#7.  She does report ongoing neck pain, and I told her it would take time for this to get better--she denies worsening or any new neurologic deficits.  She asked if it was okay to shower as well, and since she is POD#4 now, I said okay--no bath, no submersion of incision    August Luz, MD

## 2018-07-26 NOTE — Telephone Encounter (Signed)
Called patient to discuss postoperative care and questions.     28F w/ cervical radiculopathy and LUE pain/weakness s/p C3 and partial C7 lami, C4-6 laminiplasty, Left side C3-4, C4-5, C5-6 and C6-7 foraminotomies (07/21/18). Denies any new/worsening symptoms such as fever, chills, increased pain, weakness, numbness, or tingling. Patient reports dressing C/D/I, without any s/s of infection.     Patient reports pain is well controlled on regimen  -Some posterior neck pain with extension, discussed pain control - decreasing oxy as tolerated  - Reviewed wound care and return precautions.     Wanted to provide feed back about hospital stay-  Noted severe issues with the new hospital bed, hard to get out of and hard to use.   Unable to sleep due to humming noise and increased muscle pain with trying to pull her up out of bed.

## 2018-07-28 NOTE — Addendum Note (Signed)
Addendum  created 07/28/18 1721 by Joanne Gavel, MD    Attestation recorded in Intraprocedure, Intraprocedure Attestations filed

## 2018-08-02 ENCOUNTER — Ambulatory Visit: Payer: Self-pay | Admitting: Neurological Surgery

## 2018-08-26 ENCOUNTER — Ambulatory Visit: Admit: 2018-08-26 | Discharge: 2018-08-26 | Payer: BLUE CROSS/BLUE SHIELD

## 2018-08-26 DIAGNOSIS — Z9889 Other specified postprocedural states: Secondary | ICD-10-CM

## 2018-08-26 DIAGNOSIS — Z48811 Encounter for surgical aftercare following surgery on the nervous system: Secondary | ICD-10-CM

## 2018-08-26 NOTE — Progress Notes (Signed)
[facility]    36F s/p c3 and partial C7 lami, C4-6 laminoplasty, and left side C4-7 foraminotomies presents for 6 week follow up visit.    The patient is progressing as expected 6 weeks out from surgery. She still has some occasional left arm pain but not constant. Her incision is well healed.       Physical Exam     AAOx3  MAE 5/5 except for left triceps 4+/5  Sensation grossly full    DIAGNOSIS:   S/P spinal surgery  Cervical foraminal stenosis  Cervical stenosis    ASSESSMENT AND PLAN: 36F s/p c3 and partial C7 lami, C4-6 laminoplasty, and left side C4-7 foraminotomies presents for 6 week follow up visit.    The patient can start physical therapy, and take NSAIDS as needed for pain. I will see her back at 6 months post-op.

## 2018-08-27 ENCOUNTER — Ambulatory Visit: Admit: 2018-08-27 | Discharge: 2018-08-27 | Payer: BLUE CROSS/BLUE SHIELD

## 2018-09-15 ENCOUNTER — Telehealth: Payer: Self-pay | Admitting: Neurological Surgery

## 2018-09-15 NOTE — Telephone Encounter (Signed)
EMR review seen by Dr. Maudie Mercury on 05/17/18 with f/u if pt wishes surgery.    07/21/18 care everywhere [place]:  1) Left C3-4, C4-5, C5-6 and C6-7 posterior cerivlca foraminotomies   2) C3 and partial C7 laminectomies  3) C4-6 laminoplasty with mini-plates    Spoke to Lincoln Maxin, will forward to her.    Norva Karvonen, RN, BSN  [place]  [phone]

## 2018-09-15 NOTE — Telephone Encounter (Signed)
S/w [name] verified name, DOB, MRN and/or address at initiation of call.    Your provider:Dr. Maudie Mercury    In your own words what is the chief concern you would like addressed by the doctor today: Please call pt she hs some question about spinal cord injury.     What is a good contact number: [phone] call before 4pm any day.    If you are not available, is it ok to leave a detailed message on your voicemail:  Yes       Telephone calls are triaged; Urgent issues are triaged first then in the order received. Our goal is to contact every patient within 24 hours of the initial call; however we have up to 48 hours for routine calls and up to 72 hours for medication refills.  If YOU feel it's an EMERGENCY and you don't think you can wait for a response, then hang up and go to your nearest EMERGENCY room or dial 911.

## 2018-09-15 NOTE — Telephone Encounter (Signed)
Patient identified x 3; Lat Name: Pietsch -DOB:  Jul 11, 1961- Address: [address] [address]       needs some questions answered by Dr Maudie Mercury.  Will fax paperwork to [phone]  Please give to Dr Maudie Mercury on Monday 09/20/2018.    FYI had cervical surgery done at  by Dr Durward Mallard  07/21/2018

## 2018-09-16 NOTE — Telephone Encounter (Signed)
Paperwork has not been received, will call pt.      Called the patient, no answer, left voice message for the  patient to fax the paperwork at [phone] and if any questions to call back the clinic at [phone] and speak with the advise nurse.    Norva Karvonen, RN, [place]  [phone]

## 2018-09-20 ENCOUNTER — Ambulatory Visit: Payer: BC Managed Care – PPO | Admitting: Neurological Surgery

## 2018-10-15 DIAGNOSIS — S8265XA Nondisplaced fracture of lateral malleolus of left fibula, initial encounter for closed fracture: Secondary | ICD-10-CM

## 2018-10-15 NOTE — ED Provider Notes (Signed)
ED First Attending   Linde Gillis    History     Chief Complaint   Patient presents with    Ankle Injury     Ankle injury s/p trip and fall. +swelling to left ankle. +CMS +pulses. Denies blood thinners.      This is an independent visit and my supervising provider is Dr. Marny Lowenstein      History provided by:  Pt   History limited by: nothing    HPI  Casey Ali is a 58 y.o. female the presents the emergency department complaining of left ankle pain. Incident occurred just prior to arrival with mechanism accidental ankle roll in the setting of hiking and tripped on rock, left ankle inversion and right knee hit ground. Quality of pain is dull and aching without any radiation. Symptoms are moderate and exacerbated with weightbearing and use of extremity. Pain has been gradually worsening since onset and is constant in nature. Patient denies previous injury to this location. Symptoms are mildly relieved with rest and ice. No known associated symptoms. Patient denies numbness, tingling, weakness or stiffness of extremity. There is mild associated swelling. Patient denies any recent illness, bone disorder, or frequent fractures. There is no concern for nonaccidental trauma at this time.    tdap status unknown, pt to check w PCP.     Allergies/Contraindications   Allergen Reactions    Shellfish Containing Products Anaphylaxis    Peanut Swelling     Low allergy but patient can eat 1/2 peanut and be ok    Pollen Extracts     Suture      Patient reports allergy to cat gut suture- reaction not specified.     Penicillins Hives and Rash       Previous Medications    ACETAMINOPHEN (TYLENOL) 325 MG TABLET    Take 2 tablets (650 mg total) by mouth 3 (three) times daily. For 2 weeks. Then take every 8 hours as needed for mild pain    ASPIRIN 325 MG EC TABLET    Take 1 tablet (325 mg total) by mouth every 6 (six) hours as needed for Pain.    BACLOFEN (LIORESAL) 10 MG TABLET    Take 0.5-1 tablets (5-10 mg total) by mouth every 6  (six) hours as needed (muscle spasm).    CYANOCOBALAMIN, VITAMIN B12, 500 MCG TABLET    Take 500 mcg by mouth every other day.    GABAPENTIN (NEURONTIN) 300 MG CAPSULE    Take 1 capsule (300 mg total) by mouth 3 (three) times daily as needed (shooting pain).    KETOTIFEN (ZADITOR) 0.025 % (0.035 %) OPHTHALMIC SOLUTION    Place 1 drop into both eyes 2 (two) times daily.    LORATADINE (CLARITIN ORAL)    Take 5 mg by mouth daily as needed (allergies).     NALOXONE 4 MG/ACTUATION SPRAYNAERO    1 spray by Nasal route once as needed (suspected overdose). Call 911. Repeat if needed    ONDANSETRON (ZOFRAN) 4 MG TABLET    Take 1 tablet (4 mg total) by mouth every 6 (six) hours as needed for Nausea.    OXYCODONE (ROXICODONE) 5 MG TABLET    Take 1-2 tablets (5-10 mg total) by mouth every 4 (four) hours as needed (Moderate to severe pain).    POLYETHYLENE GLYCOL (MIRALAX) 17 GRAM PACKET    Take 1 packet (17 g total) by mouth Daily. HOLD for loose stool    SENNA (SENOKOT) 8.6 MG TABLET  Take 2 tablets (17.2 mg total) by mouth nightly at bedtime. HOLD for loose stool       Past Medical History   Diagnosis Date    Adverse effect of anesthesia     Asthma     PONV (postoperative nausea and vomiting)        Past Surgical History:   Procedure Laterality Date    CESAREAN SECTION      X2    CYST REMOVAL      WISDOM TOOTH EXTRACTION         Social History     Socioeconomic History    Marital status: Married     Spouse name: Not on file    Number of children: Not on file    Years of education: Not on file    Highest education level: Not on file   Occupational History    Not on file   Social Needs    Financial resource strain: Not on file    Food insecurity:     Worry: Not on file     Inability: Not on file    Transportation needs:     Medical: Not on file     Non-medical: Not on file   Tobacco Use    Smoking status: Never Smoker    Smokeless tobacco: Never Used   Substance and Sexual Activity    Alcohol use: Yes      Alcohol/week: 1.0 standard drinks     Types: 1 Glasses of wine per week    Drug use: Not Currently    Sexual activity: Not on file   Lifestyle    Physical activity:     Days per week: Not on file     Minutes per session: Not on file    Stress: Not on file   Relationships    Social connections:     Talks on phone: Not on file     Gets together: Not on file     Attends religious service: Not on file     Active member of club or organization: Not on file     Attends meetings of clubs or organizations: Not on file     Relationship status: Not on file    Intimate partner violence:     Fear of current or ex partner: Not on file     Emotionally abused: Not on file     Physically abused: Not on file     Forced sexual activity: Not on file   Other Topics Concern    Not on file   Social History Narrative    Not on file       Family History   Problem Relation Name Age of Onset    Bleeding disorder Neg Hx      Anesth problems Neg Hx         History, Medications and Nursing Notes were reviewed by Silver Huguenin   Review of Systems     Review of Systems   Constitutional: Positive for activity change (of affected area). Negative for appetite change and fever.   Eyes: Negative for visual disturbance.   Respiratory: Negative for wheezing and stridor.    Cardiovascular: Negative for leg swelling.   Musculoskeletal: Positive for gait problem (antalgic) and joint swelling. Negative for arthralgias, neck pain and neck stiffness.   Skin: Negative for color change, rash and wound.   Neurological: Negative for dizziness, weakness, light-headedness, numbness and headaches.   Hematological: Does not bruise/bleed  easily.   Psychiatric/Behavioral: Negative for confusion.       Physical Exam   Triage Vital Signs:  BP: 188/86, Pulse - Palpated/Pleth: 102, *Resp: 18, SpO2: 98 %    Physical Exam  Vitals signs and nursing note reviewed.   Constitutional:       General: She is not in acute distress.     Appearance: She is well-developed.    HENT:      Head: Normocephalic and atraumatic.   Eyes:      Conjunctiva/sclera: Conjunctivae normal.   Neck:      Musculoskeletal: Normal range of motion.   Cardiovascular:      Rate and Rhythm: Normal rate.      Comments: Intact pedal pulses  Pulmonary:      Effort: Pulmonary effort is normal.   Musculoskeletal:      Comments: Left ankle with swelling over the lateral malleoli. Pain w inversion and eversion. No tibial bony tenderness. Nl rom of the toes and knee.     Skin:     General: Skin is warm.      Capillary Refill: Capillary refill takes less than 2 seconds.      Comments: Capillary refill < 3 seconds.    Neurological:      Mental Status: She is alert and oriented to person, place, and time.      Comments: Intact distal sensation.            Interpretations:  Lab, Imaging, EKG & Rhythm Strip   Xray reviewed contemporaneously by me. Distal fib avulsion fracture     ED Course (Document differential diagnosis, ED treatment, response to treatment, reasons for choice of disposition, and whether further outpatient workup is needed or if patient is going to OR or ICU)     ED Course as of Oct 16 2031   Sheryle SprayLisette Ann Toretto Tingler's Documentation   Fri Oct 15, 2018   2010 Ortho paged for distal fib avulsion fracture        Differential diagnosis considered, but not limited to fracture, tendon/ligament injury, muscular strain, disslocation and less likely malignancy, rhabdomyolysis, Paget's disease.        Reassessment   stable    Coding and Billing Info     MDM  Definitive fracture care performred for the lateral malleoli. This included analgesia in the ED w ice (nsaids office, pt declined) and OP orthopedic referral.  I have counseled the pt on possible complications of the fractures and signs and symptoms which would mandate return for further care as well as the utility of RICE therapy. The patient has expressed their understanding.                 Sheryle SprayLisette Ann [place]az, [place]  10/15/18 2033       Linde GillisKavita Gandhi, MD  10/28/18  1120

## 2018-10-16 ENCOUNTER — Emergency Department: Admit: 2018-10-16 | Discharge: 2018-10-16 | Payer: BLUE CROSS/BLUE SHIELD

## 2018-10-18 ENCOUNTER — Telehealth: Payer: Self-pay | Admitting: Family Medicine

## 2018-10-18 DIAGNOSIS — S82832A Other fracture of upper and lower end of left fibula, initial encounter for closed fracture: Secondary | ICD-10-CM

## 2018-10-18 NOTE — Telephone Encounter (Signed)
Rebecca Warner is a 58yr old adult  3 patient identifiers used.  Per:   patient  Clear Triage Note:  Major neuro surgery 2.5 months ago.   Last Friday she fell and fractured her ankle in [place].  Avulsion fracture of distal fibula.  Was sent home with boot and cane.  Borrowed crutches but hurts her neck.   Would like a referral to ortho in [place].  Borrowed manual wheel chair and she is unable to use it due to upper body strength.    Protocol Used: PCP Call - No Triage (Adult)  Protocol-Based Disposition: Call PCP within 24 Hours    Positive Triage Question:  * [1] Follow-up call from patient regarding patient's clinical status AND [2] information NON-URGENT  Disposition: Consult with MD  Wants ortho referral in [place].  Also requesting knee scooter?  Please advise..  Per:   patient verbalizes agreement to plan. Agrees to callback with any increase in symptoms/concerns or questions.    Clista Bernhardt  RN  PCN Triage  [phone]

## 2018-10-18 NOTE — Telephone Encounter (Signed)
Patient notified, given number to orthopedics and referrals.  Evie Lacks, MA1

## 2018-10-18 NOTE — Telephone Encounter (Signed)
Referral to orthopedics and DME placed today.

## 2018-10-21 ENCOUNTER — Telehealth: Payer: Self-pay | Admitting: Family Medicine

## 2018-10-21 NOTE — Telephone Encounter (Signed)
Patient called in asking for her ORTHO referral to be changed to URGENT she is still waiting to hear back from ortho and is upset it is taking so long. They are in triage process right now and she has been waiting for a week to be seen. - referral placed by Dr Ginette Pitman     Please contact patient once this is completed     [facility]

## 2018-10-21 NOTE — Telephone Encounter (Signed)
I left a message to call office.  OPERATOR:  Please inform patient referral updated to urgent, but all urgent requests are reviewed by ortho and they determine how soon patient needs to be seen. Evie Lacks, MA1

## 2018-10-21 NOTE — Telephone Encounter (Signed)
Orthopedic referral changed to urgent

## 2018-10-21 NOTE — Addendum Note (Signed)
Addended by: Vita Erm on: 10/21/2018 09:56 AM     Modules accepted: Orders

## 2018-10-26 NOTE — Telephone Encounter (Signed)
Patient seen by ortho yesterday. Evie Lacks, MA1

## 2018-12-06 ENCOUNTER — Other Ambulatory Visit: Payer: Self-pay

## 2018-12-14 ENCOUNTER — Other Ambulatory Visit: Payer: Self-pay

## 2018-12-14 DIAGNOSIS — Z1239 Encounter for other screening for malignant neoplasm of breast: Principal | ICD-10-CM

## 2018-12-31 ENCOUNTER — Other Ambulatory Visit: Payer: Self-pay | Admitting: Family Medicine

## 2018-12-31 ENCOUNTER — Other Ambulatory Visit: Payer: Self-pay | Admitting: Rheumatology

## 2018-12-31 DIAGNOSIS — M5412 Radiculopathy, cervical region: Principal | ICD-10-CM

## 2018-12-31 MED ORDER — SPIRONOLACTONE 25 MG TABLET: ORAL_TABLET | ORAL | 0 refills | Status: DC

## 2018-12-31 MED ORDER — ALBUTEROL SULFATE HFA 90 MCG/ACTUATION AEROSOL INHALER: 1.0000 | INHALATION_SPRAY | RESPIRATORY_TRACT | 3 refills | Status: DC | PRN

## 2018-12-31 NOTE — Telephone Encounter (Signed)
Refill Request    Patient can be reached at: [phone] (home)     Patient is requesting refills for Spironolactone (ALDACTONE) 25 mg Tablet.    Patient has 0 days remaining.  Last filled on: 03/29/18  Patient's date of last appointment 05/28/18.    Pharmacy:     [id number] - [place], [address], [facility] [phone] [place] [address]  Phone: [phone] Fax: [phone]      No future appointments.     Patient has been instructed to call Pharmacy to follow up on the refill request.    Manteca III  Patient [place]  [phone]) [phone]

## 2018-12-31 NOTE — Telephone Encounter (Signed)
Refill Request    Patient can be reached at: [phone] (home)     Patient is requesting refills for Albuterol (PROAIR HFA, PROVENTIL HFA, VENTOLIN HFA) 90 mcg/actuation inhaler.    Patient has 0 days remaining.  Last filled on: N/A  Patient's date of last appointment n/a.    Pharmacy:     [id number] - [place], [address], [place] [facility] [phone] [place] [address]  Phone: [phone] Fax: [phone]      No future appointments.     Patient has been instructed to call Pharmacy to follow up on the refill request.    Shel[place] III  Patient [place]  [phone]) [phone]

## 2019-01-11 IMAGING — MG DIGITAL DIAGNOSTIC UNILATERAL RIGHT MAMMOGRAM WITH TOMO AND CAD
6 series · 6 of 18 positions shown · non-contrast
Comparison: Previous exam(s).

CLINICAL DATA: Patient was called back from screening mammogram for
a possible asymmetry and mass in the right breast.

EXAM:
DIGITAL DIAGNOSTIC UNILATERAL RIGHT MAMMOGRAM WITH CAD AND TOMO

[R MLO synth-2D (1 of 2)]
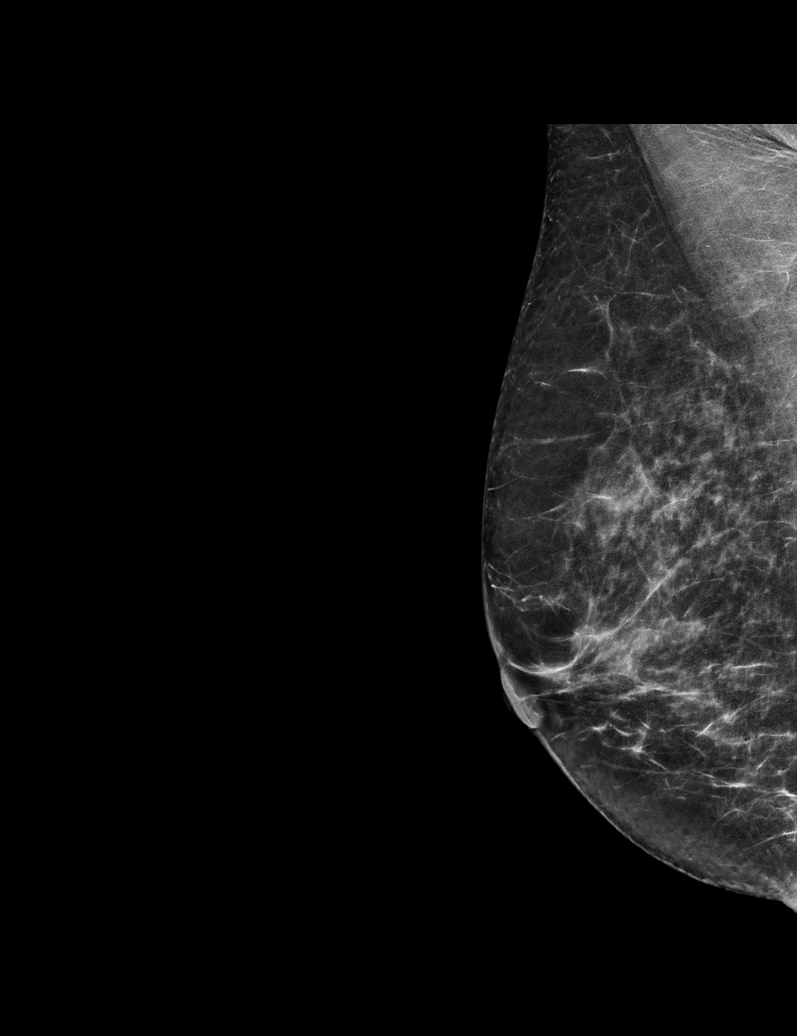

[R MLO synth-2D (2 of 2)]
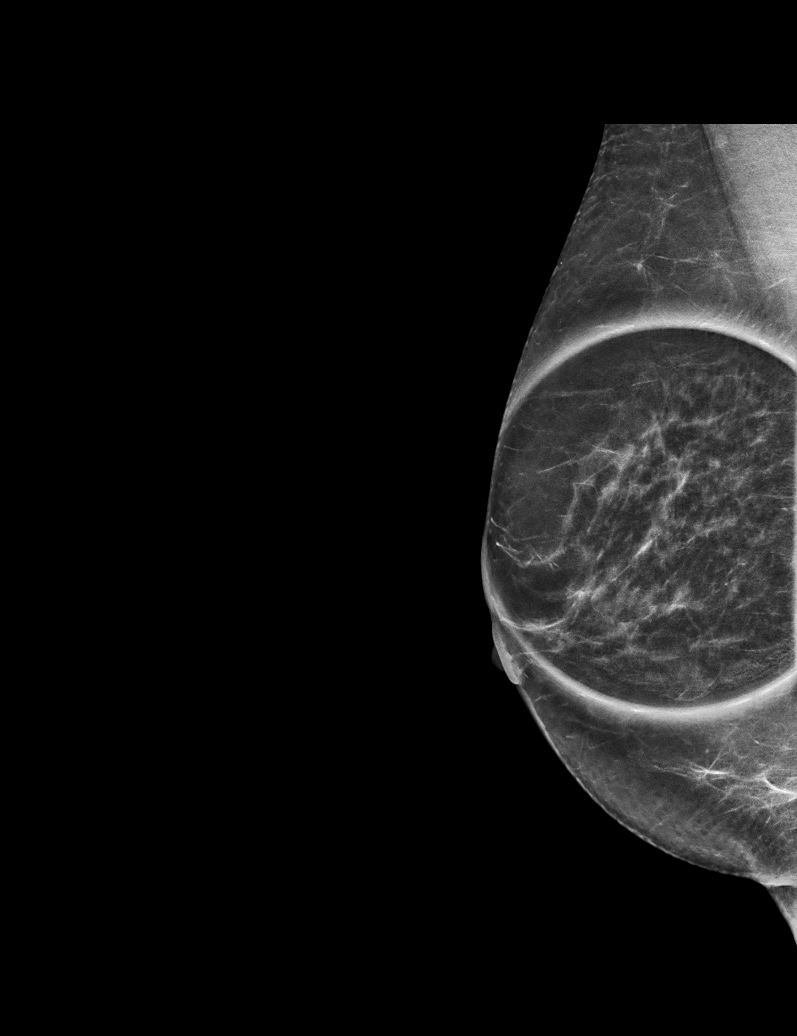

[R CC synth-2D]
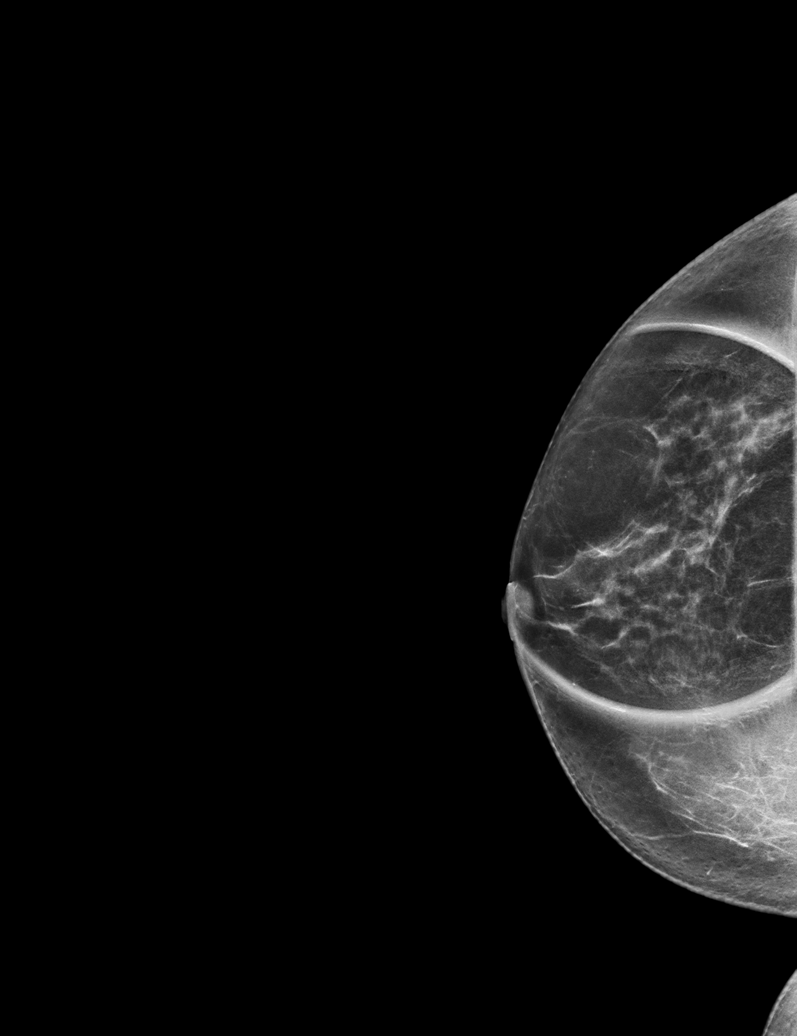

[R MLO tomo (1 of 2) · tomo slice 30/59.0]
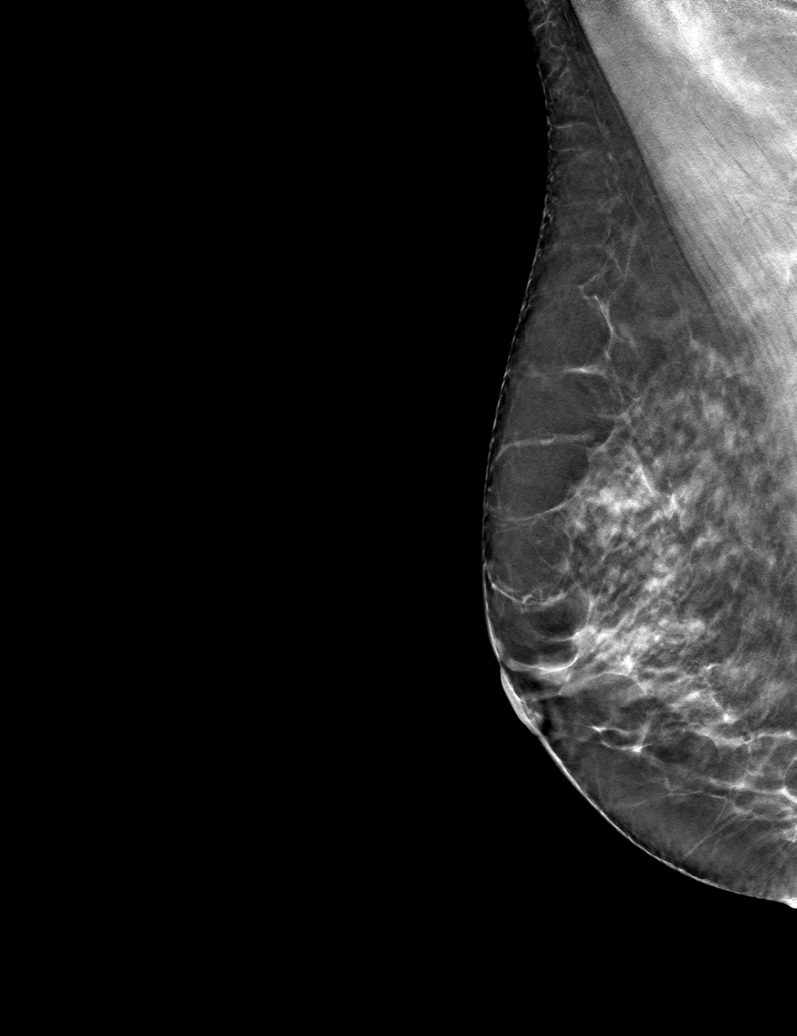

[R MLO tomo (2 of 2) · tomo slice 29/57.0]
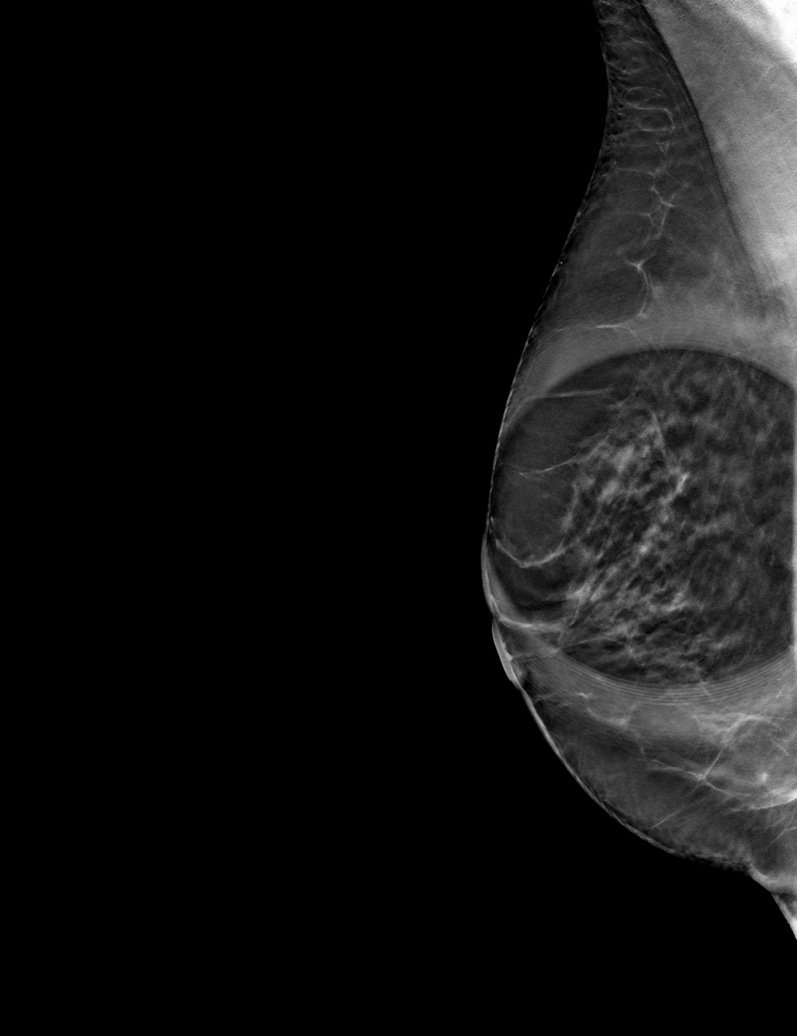

[R CC tomo · tomo slice 29/58.0]
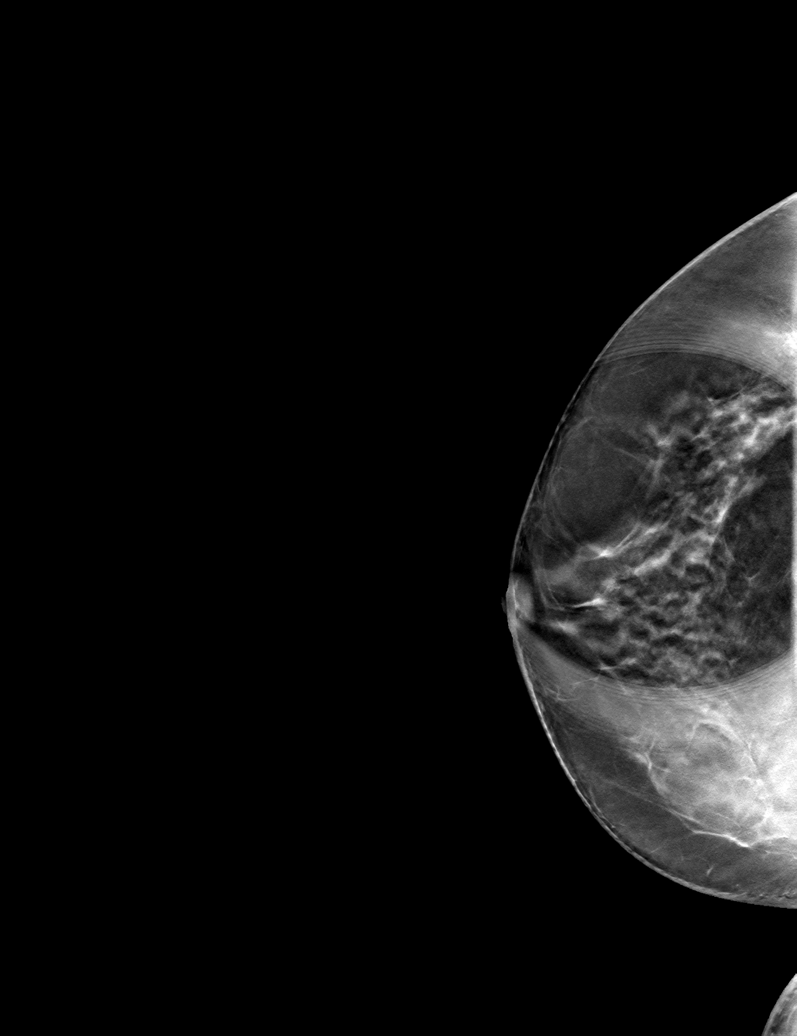

[6 of 18 positions shown; findings below may reference images not displayed]

ACR Breast Density Category c: The breast tissue is heterogeneously
dense, which may obscure small masses.
FINDINGS: Additional imaging of the right breast was performed. No suspicious
mass, malignant type microcalcifications or distortion detected.

Mammographic images were processed with CAD.
IMPRESSION: No evidence of malignancy in the right breast.

RECOMMENDATION:
Bilateral screening mammogram in 1 year is recommended.

I have discussed the findings and recommendations with the patient.
Results were also provided in writing at the conclusion of the
visit. If applicable, a reminder letter will be sent to the patient
regarding the next appointment.

BI-RADS CATEGORY  1: Negative.

## 2019-02-01 ENCOUNTER — Encounter: Payer: Self-pay | Admitting: Family Medicine

## 2019-02-07 ENCOUNTER — Telehealth: Payer: Self-pay

## 2019-02-07 NOTE — Telephone Encounter (Signed)
Wells Guiles from [employer] who states she has mailed a letter to Dr Maudie Mercury twice in December with no response.  Informed Wells Guiles that no letter was received.  Wells Guiles states she will e-mail letter to me so I can forward to Dr Maudie Mercury.

## 2019-03-14 ENCOUNTER — Telehealth: Payer: Self-pay | Admitting: Family Medicine

## 2019-03-14 NOTE — Telephone Encounter (Signed)
I left a message to call office. OPERATOR:  Please verify if patient has changed her PCP to Dr. Ginette Pitman, if so, please update in EMR.  Patient is due for physical with PCP, please assist with scheduling. Thank you, Evie Lacks, MA1

## 2019-03-15 NOTE — Telephone Encounter (Signed)
Rebecca Warner is a 58yr old adult  3 patient identifiers used  Patient said she does not know who Dr. Ginette Pitman is, and she is happy with Dr. Sarita Bottom and would like stay with her. Patient also declined a physical at this time.     [name], RN  PCN Triage

## 2019-04-21 ENCOUNTER — Ambulatory Visit: Admit: 2019-04-21 | Discharge: 2019-04-21 | Payer: BLUE CROSS/BLUE SHIELD

## 2019-04-21 DIAGNOSIS — M4802 Spinal stenosis, cervical region: Secondary | ICD-10-CM

## 2019-04-21 DIAGNOSIS — Z9889 Other specified postprocedural states: Secondary | ICD-10-CM

## 2019-04-21 DIAGNOSIS — Z09 Encounter for follow-up examination after completed treatment for conditions other than malignant neoplasm: Secondary | ICD-10-CM

## 2019-04-21 NOTE — Progress Notes (Signed)
I saw Casey Ali today at [facility].    The patient requested this visit. I obtained consent from the patient to conduct this visit by telephone only. I spent a total of 11-20 minutes in audio communication with this patient.    51F s/p c3 and partial C7 lami, C4-6 laminoplasty, and left side C4-7 foraminotomies presents for 7 months follow up visit.    The patient is doing well over, but had some setback since her ankle fracture in January 2020. She feels that she had made more progress over the last few months when she bough an elliptical bike for exercise. She is ambulating well.    She denies any weakness.            DIAGNOSIS:  EncounterDiagnoses    S/P spinal surgery     Cervical foraminal stenosis  Cervical stenosis    ASSESSMENT AND PLAN: 51F s/p c3 and partial C7 lami, C4-6 laminoplasty, and left side C4-7 foraminotomies presents for 7 months follow up visit.    Doing well overall, plan to follow up at 1 year. Get 29-month post-op xrays locally.    I spent a total of 15 minutes face-to-face with the patient and >50% of that time was spent counseling regarding the symptoms, treatment plan, risks and/or therapeutic options for the diagnoses above.

## 2019-07-11 ENCOUNTER — Telehealth: Payer: Self-pay

## 2019-07-11 NOTE — Telephone Encounter (Signed)
Called and spoke with the patient and she confirmed her appointment tomorrow 07/12/2019 at 0900 with Dr. Harrington Challenger at [facility].    Maryln Gottron, RN

## 2019-07-11 NOTE — Telephone Encounter (Signed)
M.D. Name:  Beeper#:   Date: 07/11/2019    Last Appt:  Next Appt:   Referring Physician:  Beeper#:     Eye Problem: OD - right eye  How long? yesterday  Describe: Pt has a 30 year history of benign floaters.  Yesterday, Pt noticed when she looked up-down-side by side, she would experience a pretty black and white bright ring.  Flashes when Pt moves eye, goes away when eye is still.  Today, Pt is seeing flashes more frequently today.  Number of floaters have increased as well as the size.  Pt declines dark curtain but says there is a shadow that comes and goes.    Any pain (stabbing, dull, achy) or gritty feelings in eye(s)? no  Describe:     Current Symptoms: flashes  floaters  increase in floaters    Any change in vision?  Yes (flashes and floaters.)  Has patient been seen by physician for this problem?  no.  When:     Any treatment or prescription given to patient for this problem (eye drops, ointments, vision checked)?  no.  If yes, treatment/Rx:   Has patient ever had eye surgery?  no  Does patient wear glasses, bifocals or contact lens? Contact lens  Presence of systemic disease such as hypertension, diabetes or coronary artery disease? no - If yes, which:     Currently using eye med? yes - If yes, medication: antihistamine allergy eye drops (ketotifen fumarade) [facility]    Additional information:

## 2019-07-12 ENCOUNTER — Ambulatory Visit: Payer: BC Managed Care – PPO | Admitting: OPTOMETRIST

## 2019-07-12 DIAGNOSIS — H43811 Vitreous degeneration, right eye: Secondary | ICD-10-CM

## 2019-07-12 NOTE — Progress Notes (Signed)
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      Today patient reports x 3 days ago while was walking noticed very contrasted concentric circles in vision OD when moved eye quickly up and down. Pt states now sees flashes off to the temporal side of vision and superiorly OD.     *Note: a mask was worn by both provider and patient throughout the duration of the examination    Ocular Medications  None    Past Ocular History  None    See exam template    Patient Active Problem List   Diagnosis    Rosacea    Low vitamin B12 level    Degenerative cervical disc    Cervical radicular pain    Allergic rhinitis due to allergen    Acute atopic conjunctivitis    Asthma, cough variant    Allergy to mites    Dermatochalasis of both eyelids    Brow ptosis    Facial aging    Dermatochalasis of eyelids of both eyes    Lateral epicondylitis of right elbow    Trochanteric bursitis of right hip    Allergic conjunctivitis     Impression/Plan:  1. Vitreous degeneration OD  -   Pt reports flashes OD x 3 days. Denies new floaters. H/o longstanding floaters. No frank Weiss ring. No tears, holes or RD OU. Discussed condition, including s/sx of RD. RTC immediately if experience worsening s/sx.     RTC in 1 month for medical FUAP:  DFE and Optos.      Trina Ao, OD, MS, FAAO  Senior Optometrist  [facility]  Department of Ophthalmology and [employer]

## 2019-07-12 NOTE — Procedures (Signed)
Fundus photos were taken OU using Optos at the request of Dr. Ross.  Everli Rother, Technician

## 2019-07-12 NOTE — Procedures (Signed)
Ordered Optos:    Diagnosis: H43.811    Interpretation:  OD: Vitreous degeneration. No tears, holes or RD.  OS: WNL. No tears, holes or RD.

## 2019-07-12 NOTE — Nursing Note (Signed)
Patient was wearing a surgical mask.  Droplet precautions were followed when caring for the patient.   PPE used by me during encounter: Surgical mask and eye protection     Zineb Glade, Technician

## 2019-07-19 ENCOUNTER — Other Ambulatory Visit: Payer: Self-pay | Admitting: Obstetrics and Gynecology

## 2019-07-19 DIAGNOSIS — Z1231 Encounter for screening mammogram for malignant neoplasm of breast: Secondary | ICD-10-CM

## 2019-07-21 ENCOUNTER — Ambulatory Visit: Admit: 2019-07-21 | Discharge: 2019-07-21 | Payer: BLUE CROSS/BLUE SHIELD

## 2019-07-21 DIAGNOSIS — Z48811 Encounter for surgical aftercare following surgery on the nervous system: Secondary | ICD-10-CM

## 2019-07-21 DIAGNOSIS — Z09 Encounter for follow-up examination after completed treatment for conditions other than malignant neoplasm: Secondary | ICD-10-CM

## 2019-07-21 NOTE — Other (Signed)
Dr. Edd Arbour attempted to reach patient by phone to discuss follow up as scheduled.   No answer, left voice mail to return call.

## 2019-07-21 NOTE — Progress Notes (Signed)
I saw Casey Ali today at [facility].    The patient requested this visit. I obtained consent from the patient to conduct this visit by telephone only. I spent a total of 11-20 minutes in audio communication with this patient.      33F s/p c3 and partial C7 lami, C4-6 laminoplasty, and left side C4-7 foraminotomies presents for 1 year post-op visit.    The patient is doing reasonably well, still feels that she has to hold her head up after prolonged activities. Her last set of xrays in Nov 2019 showed good cervical alignment, no recent imaging. She denies any new weakness.            DIAGNOSIS:  EncounterDiagnoses   S/P spinal surgery     Cervical foraminal stenosis  Cervical stenosis    ASSESSMENT AND PLAN:33F s/p c3 and partial C7 lami, C4-6 laminoplasty, and left side C4-7 foraminotomies presents for 1 year follow up visit.    Stable, plan to get follow up xrays. Follow up at 2 years post-op.    I spent a total of 15 minutes face-to-face with the patient and >50% of that time was spent counseling regarding the symptoms, treatment plan, risks and/or therapeutic options for the diagnoses above.

## 2019-07-27 ENCOUNTER — Telehealth: Payer: Self-pay | Admitting: Family Medicine

## 2019-07-27 NOTE — Telephone Encounter (Signed)
I left a message to call office. OPERATOR:  Please inform patient that Dr. Sarita Bottom requests she schedule a well woman exam as she is past due for pap.  Please assist with scheduling. Evie Lacks, MA1

## 2019-08-09 ENCOUNTER — Ambulatory Visit: Payer: Self-pay | Admitting: OPTOMETRIST

## 2019-08-10 NOTE — Telephone Encounter (Signed)
Letter mailed to patient asking if she has changed PCP, if not, due for pap. Evie Lacks, MA1

## 2019-08-15 ENCOUNTER — Ambulatory Visit: Payer: BC Managed Care – PPO | Admitting: OPTOMETRIST

## 2019-08-15 DIAGNOSIS — H43811 Vitreous degeneration, right eye: Secondary | ICD-10-CM

## 2019-08-15 DIAGNOSIS — H1045 Other chronic allergic conjunctivitis: Secondary | ICD-10-CM

## 2019-08-15 DIAGNOSIS — H0288A Meibomian gland dysfunction right eye, upper and lower eyelids: Secondary | ICD-10-CM

## 2019-08-15 DIAGNOSIS — H02883 Meibomian gland dysfunction of right eye, unspecified eyelid: Secondary | ICD-10-CM

## 2019-08-15 DIAGNOSIS — H02886 Meibomian gland dysfunction of left eye, unspecified eyelid: Secondary | ICD-10-CM

## 2019-08-15 MED ORDER — OLOPATADINE 0.2 % EYE DROPS: 1.0000 [drp] | Freq: Every day | OPHTHALMIC | 3 refills | Status: DC

## 2019-08-15 NOTE — Procedures (Signed)
Fundus photos were taken OU using Optos at the request of Dr. Ross.  Gennavieve Huq, Technician

## 2019-08-15 NOTE — Nursing Note (Signed)
Patient was wearing a surgical mask.  Droplet precautions were followed when caring for the patient.   PPE used by me during encounter: Surgical mask and eye protection     Zakhai Meisinger, Technician

## 2019-08-15 NOTE — Procedures (Signed)
Ordered Optos:    Diagnosis: H43.811    Interpretation:  OD: PVD. No tears, holes or RD.  OS: WNL. No tears, holes or RD.

## 2019-08-15 NOTE — Progress Notes (Signed)
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    Today patient reports Pt states flashes seems less prevalent, not noticing as often. Pt states vision stable since last visit. Denies pain.     *Note: a mask was worn by both provider and patient throughout the duration of the examination    Ocular Medications  Ketotifen PRN OU    Past Ocular History  None    See exam template        Patient Active Problem List   Diagnosis    Rosacea    Low vitamin B12 level    Degenerative cervical disc    Cervical radicular pain    Allergic rhinitis due to allergen    Acute atopic conjunctivitis    Asthma, cough variant    Allergy to mites    Dermatochalasis of both eyelids    Brow ptosis    Facial aging    Dermatochalasis of eyelids of both eyes    Lateral epicondylitis of right elbow    Trochanteric bursitis of right hip    Allergic conjunctivitis     Impression/Plan:  1. PVD OD  -   Ordered Optos. Reviewed imaging.  -   Pt reports improved s/sx OD. Weiss ring OD. No tears, holes or RD OU. Discussed condition, including s/sx of RD.     2. Perennial allergic conjunctivitis OU  -   Current tx: 1) 1/2 Clarain daily PO (Zyrtec during allergy season), 2) Ketotifen PRN OU. H/o seasonal allergies. Discussed condition. DC Ketotifen. Rx Pataday daily OU. Cont 1/2 Clarain daily PO (Zyrtec during allergy season). Consider Pazeo, if symptoms persist. Monitor.    3. Mild MGD OU  -   Discussed condition. Rx warm compress (Bruder mask) daily OU. Rx facial and eyelid hygiene daily OU. Consider lipid based AT's, Restasis/Xiidra. Monitor.    RTC PRN      Trina Ao, OD, MS, FAAO  Senior Optometrist  [facility]  Department of Ophthalmology and [employer]

## 2019-09-01 ENCOUNTER — Other Ambulatory Visit: Payer: Self-pay

## 2019-09-01 ENCOUNTER — Ambulatory Visit
Admission: RE | Admit: 2019-09-01 | Discharge: 2019-09-01 | Disposition: A | Discharge status: Home / Self Care | Payer: BC Managed Care – PPO | Source: Ambulatory Visit | Attending: Obstetrics and Gynecology | Admitting: Obstetrics and Gynecology

## 2019-09-01 DIAGNOSIS — Z1231 Encounter for screening mammogram for malignant neoplasm of breast: Secondary | ICD-10-CM

## 2019-09-21 ENCOUNTER — Encounter: Payer: Self-pay | Admitting: Family Medicine

## 2019-09-21 NOTE — Telephone Encounter (Signed)
Received letter from patient dated 08/10/19 and is doing well.  Declines PE at this time due to pandemic  Is past due for colonoscopy and pap/HPV -   Adjusted HM modifier that patient persistently declines and is recovering from neck surgery .    Will update after pandemic when patient is comfortable in return to care

## 2019-10-21 NOTE — Telephone Encounter (Signed)
17 min call    Casey Ali is a 59 y.o. female s/p c3 and partial C7 lami, C4-6 laminoplasty, and left side C4-7 foraminotomies done on 07/21/18. Reached patient by phone to discuss recent mychart message and questions of prognosis/recovery or coordination changes.  Describes ongoing issues with balance (single stand on left leg) and with left arm coordination (setting items down hard when trying to set down softly). She notes mild improvement in coordination concerns since surgery. She has had good improvement in neck pain and good strength recovery. 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 Plan office follow up if able at 2 year mark pending pandemic status.

## 2019-10-27 ENCOUNTER — Encounter: Payer: Self-pay | Admitting: Rheumatology

## 2019-12-29 ENCOUNTER — Other Ambulatory Visit: Payer: Self-pay | Admitting: Family Medicine

## 2020-02-07 ENCOUNTER — Emergency Department (HOSPITAL_COMMUNITY)
Admission: EM | Admit: 2020-02-07 | Discharge: 2020-02-07 | Disposition: A | Discharge status: Home / Self Care | Payer: BC Managed Care – PPO | Attending: Emergency Medicine | Admitting: Emergency Medicine

## 2020-02-07 ENCOUNTER — Other Ambulatory Visit: Payer: Self-pay

## 2020-02-07 ENCOUNTER — Emergency Department (HOSPITAL_COMMUNITY): Payer: BC Managed Care – PPO

## 2020-02-07 DIAGNOSIS — R42 Dizziness and giddiness: Secondary | ICD-10-CM | POA: Diagnosis not present

## 2020-02-07 DIAGNOSIS — R002 Palpitations: Secondary | ICD-10-CM | POA: Insufficient documentation

## 2020-02-07 LAB — BASIC METABOLIC PANEL
Anion gap: 10 (ref 5–15)
BUN: 16 mg/dL (ref 6–20)
CO2: 26 mmol/L (ref 22–32)
Calcium: 9.5 mg/dL (ref 8.9–10.3)
Chloride: 105 mmol/L (ref 98–111)
Creatinine, Ser: 0.88 mg/dL (ref 0.44–1.00)
GFR calc Af Amer: >60 mL/min (ref >60)
GFR calc non Af Amer: >60 mL/min (ref >60)
Glucose, Bld: 114 mg/dL — ABNORMAL HIGH (ref 70–99)
Potassium: 3.6 mmol/L (ref 3.5–5.1)
Sodium: 141 mmol/L (ref 135–145)

## 2020-02-07 LAB — TROPONIN I (HIGH SENSITIVITY)
Troponin I (High Sensitivity): 5 ng/L (ref <18)
Troponin I (High Sensitivity): 4 ng/L (ref <18)

## 2020-02-07 LAB — CBC
HCT: 43.4 % (ref 36.0–46.0)
Hemoglobin: 14.5 g/dL (ref 12.0–15.0)
MCH: 32.2 pg (ref 26.0–34.0)
MCHC: 33.4 g/dL (ref 30.0–36.0)
MCV: 96.2 fL (ref 80.0–100.0)
Platelets: 230 10*3/uL (ref 150–400)
RBC: 4.51 MIL/uL (ref 3.87–5.11)
RDW: 11.9 % (ref 11.5–15.5)
WBC: 10.4 10*3/uL (ref 4.0–10.5)
nRBC: 0 % (ref 0.0–0.2)

## 2020-02-07 NOTE — ED Provider Notes (Signed)
[place] DEPT Provider Note   CSN: [id number] Arrival date & time: 02/07/20  1706     History Chief Complaint  Patient presents with  . Palpitations    Monica Dorsey is a 59 y.o. female past medical story of high cholesterol who presents for evaluation of near syncope, palpitations that occurred about 3:30 PM this afternoon while at work.  She reports she was sitting down at her desk and answering the telephone when all of a sudden she started feeling like her heart was racing and she felt lightheaded like she was going to pass out.  She states she never actually lost consciousness.  This sensation lasted for about 30 seconds.  She did not have any associated chest pain, difficulty breathing, nausea/vomiting, diaphoresis.  Patient reports that since then, she has not had any more episodes.  Patient reports that she has had palpitations before and does report that she has recently been stressed at work.  Patient states that she has no personal cardiac history.  Denies any family history of MI before the age of 29.  No history of hypertension or diabetes.  She has not recently been sick with any fevers, chills.  She denies any abdominal pain, nausea/vomiting.  She does not smoke and denies any cocaine, heroin use. She denies any OCP use, recent immobilization, prior history of DVT/PE, recent surgery, leg swelling, or long travel.   The history is provided by the patient.    HPI: A 59 year old patient with a history of hypercholesterolemia presents for evaluation of chest pain. Initial onset of pain was approximately 1-3 hours ago. The patient's chest pain is not worse with exertion. The patient's chest pain is not middle- or left-sided, is not well-localized, is not described as heaviness/pressure/tightness, is not sharp and does not radiate to the arms/jaw/neck. The patient does not complain of nausea and denies diaphoresis. The patient has no history of stroke,  has no history of peripheral artery disease, has not smoked in the past 90 days, denies any history of treated diabetes, has no relevant family history of coronary artery disease (first degree relative at less than age 39), is not hypertensive and does not have an elevated BMI (>=30).   No past medical history on file.  There are no problems to display for this patient.   No past surgical history on file.   OB History   No obstetric history on file.     No family history on file.  Social History   Tobacco Use  . Smoking status: Not on file  Substance Use Topics  . Alcohol use: Not on file  . Drug use: Not on file    Home Medications Prior to Admission medications   Medication Sig Start Date End Date Taking? Authorizing Provider  atorvastatin (LIPITOR) 20 MG tablet Take 20 mg by mouth daily. 11/21/19  Yes [provider]    Allergies    Amoxicillin  Review of Systems   Review of Systems  Constitutional: Negative for fever.  Respiratory: Negative for cough and shortness of breath.   Cardiovascular: Positive for palpitations (resolved). Negative for chest pain.  Gastrointestinal: Negative for abdominal pain, nausea and vomiting.  Genitourinary: Negative for dysuria and hematuria.  Neurological: Positive for light-headedness. Negative for headaches.  All other systems reviewed and are negative.   Physical Exam Updated Vital Signs BP 133/73 (BP Location: Left Arm)   Pulse 70   Temp 97.6 F (36.4 C) (Oral)   Resp  16   Ht 5\' 3"  (1.6 m)   Wt 54 kg   SpO2 100%   BMI 21.08 kg/m   Physical Exam Vitals and nursing note reviewed.  Constitutional:      Appearance: Normal appearance. She is well-developed.  HENT:     Head: Normocephalic and atraumatic.  Eyes:     General: Lids are normal.     Conjunctiva/sclera: Conjunctivae normal.     Pupils: Pupils are equal, round, and reactive to light.     Comments: PERRL. EOMs intact. No nystagmus. No neglect.     Cardiovascular:     Rate and Rhythm: Normal rate and regular rhythm.     Pulses: Normal pulses.     Heart sounds: Normal heart sounds. No murmur. No friction rub. No gallop.   Pulmonary:     Effort: Pulmonary effort is normal.     Breath sounds: Normal breath sounds.     Comments: Lungs clear to auscultation bilaterally.  Symmetric chest rise.  No wheezing, rales, rhonchi. Abdominal:     Palpations: Abdomen is soft. Abdomen is not rigid.     Tenderness: There is no abdominal tenderness. There is no guarding.     Comments: Abdomen is soft, non-distended, non-tender. No rigidity, No guarding. No peritoneal signs.  Musculoskeletal:        General: Normal range of motion.     Cervical back: Full passive range of motion without pain.     Comments: Lateral lower extremities are symmetric in appearance without any overlying warmth, erythema, edema.  Skin:    General: Skin is warm and dry.     Capillary Refill: Capillary refill takes less than 2 seconds.  Neurological:     Mental Status: She is alert and oriented to person, place, and time.     Comments: Cranial nerves III-XII intact Follows commands, Moves all extremities  5/5 strength to BUE and BLE  Sensation intact throughout all major nerve distributions No gait abnormalities  No slurred speech. No facial droop.   Psychiatric:        Speech: Speech normal.     ED Results / Procedures / Treatments   Labs (all labs ordered are listed, but only abnormal results are displayed) Labs Reviewed  BASIC METABOLIC PANEL - Abnormal; Notable for the following components:      Result Value   Glucose, Bld 114 (*)    All other components within normal limits  CBC  TROPONIN I (HIGH SENSITIVITY)  TROPONIN I (HIGH SENSITIVITY)    EKG EKG Interpretation  Date/Time:  Tuesday February 07 2020 18:44:06 EDT Ventricular Rate:  93 PR Interval:    QRS Duration: 93 QT Interval:  334 QTC Calculation: 416 R Axis:   80 Text Interpretation: Sinus  rhythm Borderline short PR interval ST-t wave abnormality Baseline wander Abnormal ECG Confirmed by Carmin Muskrat [phone]) on 02/07/2020 8:49:50 PM   Radiology DG Chest 2 View  Result Date: 02/07/2020 CLINICAL DATA:  59 year old female with history of heart palpitations. EXAM: CHEST - 2 VIEW COMPARISON:  No priors. FINDINGS: Lung volumes are normal. No consolidative airspace disease. No pleural effusions. No pneumothorax. No pulmonary nodule or mass noted. Pulmonary vasculature and the cardiomediastinal silhouette are within normal limits. IMPRESSION: No radiographic evidence of acute cardiopulmonary disease. Electronically Signed   By: Vinnie Langton M.D.   On: 02/07/2020 18:47    Procedures Procedures (including critical care time)  Medications Ordered in ED Medications - No data to display  ED Course  I have reviewed the triage vital signs and the nursing notes.  Pertinent labs & imaging results that were available during my care of the patient were reviewed by me and considered in my medical decision making (see chart for details).    MDM Rules/Calculators/A&P HEAR Score: 7                    58 year old female who presents for evaluation of near syncopal episode, palpitations that occurred acutely at 3:30 PM while at work.  No associated chest pain, difficulty breathing, diaphoresis, nausea/vomiting.  No episodes since then.  Initial ED arrival, she is afebrile, nontoxic-appearing.  Vital signs are stable.  On exam, no neuro deficits.  Consider dehydration versus electrolyte imbalance versus arrhythmia.  Plan to check labs.  Doubt ACS etiology.  Patient with no risk factors for PE.  Do not suspect acute PE as a cause of her symptoms.  Additionally, she is not hypoxic or tachycardic in the ED.  Initial trop negative.  CBC shows no leukocytosis or anemia.  BMP is unremarkable.  Chest x-ray negative for any acute abnormalities.  Patient able to ambulate in the ED with maintaining O2  sats of 97-98.  Patient did not have any symptoms.  Patient's history/risk factors, she has a heart score of 3.  Her delta troponin is negative.  At this time, do not feel that her symptoms represent ACS etiology.  Instructed patient that she will need to follow-up with her primary care doctor as she may need a Holter monitor. At this time, patient exhibits no emergent life-threatening condition that require further evaluation in ED.   Portions of this note were generated with [employer]. Dictation errors may occur despite best attempts at proofreading.  Final Clinical Impression(s) / ED Diagnoses Final diagnoses:  Palpitations  Lightheadedness    Rx / DC Orders ED Discharge Orders    None       Desma Mcgregor 02/07/20 2327    Carmin Muskrat, MD 02/08/20 [phone]

## 2020-02-07 NOTE — ED Notes (Signed)
PA at bedside.

## 2020-02-07 NOTE — ED Notes (Addendum)
Pt did very well while ambulating. Pt did not state or show any signs of dizziness or weakness while ambulating. Oxygen stats stayed at 97-98%. Pulse rate stayed at 100-102. Pt stated she feels fine. [place] notified. Naomie Dean, RN notified.

## 2020-02-07 NOTE — ED Triage Notes (Signed)
Per patient she was at work and felt heart palpitations and felt as if she would pass out. 911 was called, EKG done - NSR - cbg 104, patient denies pain.

## 2020-02-07 NOTE — Discharge Instructions (Signed)
Your work-up today was reassuring and did not show an obvious cause of your symptoms.  It may be related to stress.  If you continue to have symptoms, you will need to follow-up with your primary care doctor as you may need to wear a monitor to see if the palpitations are occurring at a specific time.  Return the emergency department for any worsening palpitations, passing out, chest pain or any other worsening concerning symptoms.

## 2020-02-16 ENCOUNTER — Ambulatory Visit (INDEPENDENT_AMBULATORY_CARE_PROVIDER_SITE_OTHER): Payer: BC Managed Care – PPO | Admitting: Cardiology

## 2020-02-16 ENCOUNTER — Other Ambulatory Visit: Payer: Self-pay

## 2020-02-16 ENCOUNTER — Encounter: Payer: Self-pay | Admitting: Cardiology

## 2020-02-16 VITALS — BP 124/84 | HR 96 | Ht 63.0 in | Wt 116.0 lb

## 2020-02-16 DIAGNOSIS — E782 Mixed hyperlipidemia: Secondary | ICD-10-CM

## 2020-02-16 DIAGNOSIS — R002 Palpitations: Secondary | ICD-10-CM

## 2020-02-16 DIAGNOSIS — E785 Hyperlipidemia, unspecified: Secondary | ICD-10-CM | POA: Insufficient documentation

## 2020-02-16 HISTORY — DX: Palpitations: R00.2

## 2020-02-16 MED ORDER — METOPROLOL TARTRATE 25 MG PO TABS
ORAL_TABLET | ORAL | 0 refills | Status: DC
Start: 2020-02-16 — End: 2020-03-26

## 2020-02-16 MED ORDER — ALPRAZOLAM 0.25 MG PO TABS: 0.1250 mg | ORAL_TABLET | Freq: Every evening | ORAL | 0 refills | Status: AC | PRN

## 2020-02-16 NOTE — Patient Instructions (Signed)
Medication Instructions:  Your physician recommends that you return for lab work in:   Take metoprolol tartrate 25 mg as needed for rapid heart rate. Take xanax 0.125 mg as needed for anxiety.   *If you need a refill on your cardiac medications before your next appointment, please call your pharmacy*   Lab Work: None ordered If you have labs (blood work) drawn today and your tests are completely normal, you will receive your results only by: Marland Kitchen MyChart Message (if you have MyChart) OR . A paper copy in the mail If you have any lab test that is abnormal or we need to change your treatment, we will call you to review the results.   Testing/Procedures: Your physician has requested that you have an echocardiogram. Echocardiography is a painless test that uses sound waves to create images of your heart. It provides your doctor with information about the size and shape of your heart and how well your heart's chambers and valves are working. This procedure takes approximately one hour. There are no restrictions for this procedure.    Follow-Up: At [facility], you and your health needs are our priority.  As part of our continuing mission to provide you with exceptional heart care, we have created designated Provider Care Teams.  These Care Teams include your primary Cardiologist (physician) and Advanced Practice Providers (APPs -  Physician Assistants and Nurse Practitioners) who all work together to provide you with the care you need, when you need it.  We recommend signing up for the patient portal called "MyChart".  Sign up information is provided on this After Visit Summary.  MyChart is used to connect with patients for Virtual Visits (Telemedicine).  Patients are able to view lab/test results, encounter notes, upcoming appointments, etc.  Non-urgent messages can be sent to your provider as well.   To learn more about what you can do with MyChart, go to [email or web address].    Your  next appointment:   6 week(s)  The format for your next appointment:   In Person  Provider:   Jyl Heinz, MD   Other Instructions  Echocardiogram An echocardiogram is a procedure that uses painless sound waves (ultrasound) to produce an image of the heart. Images from an echocardiogram can provide important information about:  Signs of coronary artery disease (CAD).  Aneurysm detection. An aneurysm is a weak or damaged part of an artery wall that bulges out from the normal force of blood pumping through the body.  Heart size and shape. Changes in the size or shape of the heart can be associated with certain conditions, including heart failure, aneurysm, and CAD.  Heart muscle function.  Heart valve function.  Signs of a past heart attack.  Fluid buildup around the heart.  Thickening of the heart muscle.  A tumor or infectious growth around the heart valves. Tell a health care provider about:  Any allergies you have.  All medicines you are taking, including vitamins, herbs, eye drops, creams, and over-the-counter medicines.  Any blood disorders you have.  Any surgeries you have had.  Any medical conditions you have.  Whether you are pregnant or may be pregnant. What are the risks? Generally, this is a safe procedure. However, problems may occur, including:  Allergic reaction to dye (contrast) that may be used during the procedure. What happens before the procedure? No specific preparation is needed. You may eat and drink normally. What happens during the procedure?   An IV tube may be inserted into  one of your veins.  You may receive contrast through this tube. A contrast is an injection that improves the quality of the pictures from your heart.  A gel will be applied to your chest.  A wand-like tool (transducer) will be moved over your chest. The gel will help to transmit the sound waves from the transducer.  The sound waves will harmlessly bounce off  of your heart to allow the heart images to be captured in real-time motion. The images will be recorded on a computer. The procedure may vary among health care providers and hospitals. What happens after the procedure?  You may return to your normal, everyday life, including diet, activities, and medicines, unless your health care provider tells you not to do that. Summary  An echocardiogram is a procedure that uses painless sound waves (ultrasound) to produce an image of the heart.  Images from an echocardiogram can provide important information about the size and shape of your heart, heart muscle function, heart valve function, and fluid buildup around your heart.  You do not need to do anything to prepare before this procedure. You may eat and drink normally.  After the echocardiogram is completed, you may return to your normal, everyday life, unless your health care provider tells you not to do that. This information is not intended to replace advice given to you by your health care provider. Make sure you discuss any questions you have with your health care provider. Document Revised: 01/20/2019 Document Reviewed: 11/01/2016 Elsevier Patient Education  [place].  Alprazolam tablets What is this medicine? ALPRAZOLAM (al PRAY zoe lam) is a benzodiazepine. It is used to treat anxiety and panic attacks. This medicine may be used for other purposes; ask your health care provider or pharmacist if you have questions. COMMON BRAND NAME(S): Xanax What should I tell my health care provider before I take this medicine? They need to know if you have any of these conditions:  an alcohol or drug abuse problem  bipolar disorder, depression, psychosis or other mental health conditions  glaucoma  kidney or liver disease  lung or breathing disease  myasthenia gravis  Parkinson's disease  porphyria  seizures or a history of seizures  suicidal thoughts  an unusual or allergic  reaction to alprazolam, other benzodiazepines, foods, dyes, or preservatives  pregnant or trying to get pregnant  breast-feeding How should I use this medicine? Take this medicine by mouth with a glass of water. Follow the directions on the prescription label. Take your medicine at regular intervals. Do not take it more often than directed. Do not stop taking except on your doctor's advice. A special MedGuide will be given to you by the pharmacist with each prescription and refill. Be sure to read this information carefully each time. Talk to your pediatrician regarding the use of this medicine in children. Special care may be needed. Overdosage: If you think you have taken too much of this medicine contact a poison control center or emergency room at once. NOTE: This medicine is only for you. Do not share this medicine with others. What if I miss a dose? If you miss a dose, take it as soon as you can. If it is almost time for your next dose, take only that dose. Do not take double or extra doses. What may interact with this medicine? Do not take this medicine with any of the following medications:  certain antiviral medicines for HIV or AIDS like delavirdine, indinavir  certain medicines  for fungal infections like ketoconazole and itraconazole  narcotic medicines for cough  sodium oxybate This medicine may also interact with the following medications:  alcohol  antihistamines for allergy, cough and cold  certain antibiotics like clarithromycin, erythromycin, isoniazid, rifampin, rifapentine, rifabutin, and troleandomycin  certain medicines for blood pressure, heart disease, irregular heart beat  certain medicines for depression, like amitriptyline, fluoxetine, sertraline  certain medicines for seizures like carbamazepine, oxcarbazepine, phenobarbital, phenytoin, primidone  cimetidine  cyclosporine  female hormones, like estrogens or progestins and birth control pills,  patches, rings, or injections  general anesthetics like halothane, isoflurane, methoxyflurane, propofol  grapefruit juice  local anesthetics like lidocaine, pramoxine, tetracaine  medicines that relax muscles for surgery  narcotic medicines for pain  other antiviral medicines for HIV or AIDS  phenothiazines like chlorpromazine, mesoridazine, prochlorperazine, thioridazine This list may not describe all possible interactions. Give your health care provider a list of all the medicines, herbs, non-prescription drugs, or dietary supplements you use. Also tell them if you smoke, drink alcohol, or use illegal drugs. Some items may interact with your medicine. What should I watch for while using this medicine? Tell your doctor or health care professional if your symptoms do not start to get better or if they get worse. Do not stop taking except on your doctor's advice. You may develop a severe reaction. Your doctor will tell you how much medicine to take. You may get drowsy or dizzy. Do not drive, use machinery, or do anything that needs mental alertness until you know how this medicine affects you. To reduce the risk of dizzy and fainting spells, do not stand or sit up quickly, especially if you are an older patient. Alcohol may increase dizziness and drowsiness. Avoid alcoholic drinks. If you are taking another medicine that also causes drowsiness, you may have more side effects. Give your health care provider a list of all medicines you use. Your doctor will tell you how much medicine to take. Do not take more medicine than directed. Call emergency for help if you have problems breathing or unusual sleepiness. What side effects may I notice from receiving this medicine? Side effects that you should report to your doctor or health care professional as soon as possible:  allergic reactions like skin rash, itching or hives, swelling of the face, lips, or tongue  breathing  problems  confusion  loss of balance or coordination  signs and symptoms of low blood pressure like dizziness; feeling faint or lightheaded, falls; unusually weak or tired  suicidal thoughts or other mood changes Side effects that usually do not require medical attention (report to your doctor or health care professional if they continue or are bothersome):  dizziness  dry mouth  nausea, vomiting  tiredness This list may not describe all possible side effects. Call your doctor for medical advice about side effects. You may report side effects to FDA at 1-800-FDA-1088. Where should I keep my medicine? Keep out of the reach of children. This medicine can be abused. Keep your medicine in a safe place to protect it from theft. Do not share this medicine with anyone. Selling or giving away this medicine is dangerous and against the law. Store at room temperature between 20 and 25 degrees C (68 and 77 degrees F). This medicine may cause accidental overdose and death if taken by other adults, children, or pets. Mix any unused medicine with a substance like cat litter or coffee grounds. Then throw the medicine away in a sealed container  like a sealed bag or a coffee can with a lid. Do not use the medicine after the expiration date. NOTE: This sheet is a summary. It may not cover all possible information. If you have questions about this medicine, talk to your doctor, pharmacist, or health care provider.  2020 Elsevier/Gold Standard (2015-06-28 13:47:25)  Metoprolol Tablets What is this medicine? METOPROLOL (me TOE proe lole) is a beta blocker. It decreases the amount of work your heart has to do and helps your heart beat regularly. It is used to treat high blood pressure and/or prevent chest pain (also called angina). It is also used after a heart attack to prevent a second one. This medicine may be used for other purposes; ask your health care provider or pharmacist if you have  questions. COMMON BRAND NAME(S): Lopressor What should I tell my health care provider before I take this medicine? They need to know if you have any of these conditions:  diabetes  heart or vessel disease like slow heart rate, worsening heart failure, heart block, sick sinus syndrome or Raynaud's disease  kidney disease  liver disease  lung or breathing disease, like asthma or emphysema  pheochromocytoma  thyroid disease  an unusual or allergic reaction to metoprolol, other beta-blockers, medicines, foods, dyes, or preservatives  pregnant or trying to get pregnant  breast-feeding How should I use this medicine? Take this drug by mouth with water. Take it as directed on the prescription label at the same time every day. You can take it with or without food. You should always take it the same way. Keep taking it unless your health care provider tells you to stop. Talk to your health care provider about the use of this drug in children. Special care may be needed. Overdosage: If you think you have taken too much of this medicine contact a poison control center or emergency room at once. NOTE: This medicine is only for you. Do not share this medicine with others. What if I miss a dose? If you miss a dose, take it as soon as you can. If it is almost time for your next dose, take only that dose. Do not take double or extra doses. What may interact with this medicine? This medicine may interact with the following medications:  certain medicines for blood pressure, heart disease, irregular heart beat  certain medicines for depression like monoamine oxidase (MAO) inhibitors, fluoxetine, or paroxetine  clonidine  dobutamine  epinephrine  isoproterenol  reserpine This list may not describe all possible interactions. Give your health care provider a list of all the medicines, herbs, non-prescription drugs, or dietary supplements you use. Also tell them if you smoke, drink alcohol,  or use illegal drugs. Some items may interact with your medicine. What should I watch for while using this medicine? Visit your doctor or health care professional for regular check ups. Contact your doctor right away if your symptoms worsen. Check your blood pressure and pulse rate regularly. Ask your health care professional what your blood pressure and pulse rate should be, and when you should contact them. You may get drowsy or dizzy. Do not drive, use machinery, or do anything that needs mental alertness until you know how this medicine affects you. Do not sit or stand up quickly, especially if you are an older patient. This reduces the risk of dizzy or fainting spells. Contact your doctor if these symptoms continue. Alcohol may interfere with the effect of this medicine. Avoid alcoholic drinks. This medicine  may increase blood sugar. Ask your healthcare provider if changes in diet or medicines are needed if you have diabetes. What side effects may I notice from receiving this medicine? Side effects that you should report to your doctor or health care professional as soon as possible:  allergic reactions like skin rash, itching or hives  cold or numb hands or feet  depression  difficulty breathing  faint  fever with sore throat  irregular heartbeat, chest pain  rapid weight gain   signs and symptoms of high blood sugar such as being more thirsty or hungry or having to urinate more than normal. You may also feel very tired or have blurry vision.  swollen legs or ankles Side effects that usually do not require medical attention (report to your doctor or health care professional if they continue or are bothersome):  anxiety or nervousness  change in sex drive or performance  dry skin  headache  nightmares or trouble sleeping  short term memory loss  stomach upset or diarrhea This list may not describe all possible side effects. Call your doctor for medical advice about  side effects. You may report side effects to FDA at 1-800-FDA-1088. Where should I keep my medicine? Keep out of the reach of children and pets. Store at room temperature between 15 and 30 degrees C (59 and 86 degrees F). Protect from moisture. Keep the container tightly closed. Throw away any unused drug after the expiration date. NOTE: This sheet is a summary. It may not cover all possible information. If you have questions about this medicine, talk to your doctor, pharmacist, or health care provider.  2020 Elsevier/Gold Standard (2019-05-12 17:21:17)

## 2020-02-16 NOTE — Progress Notes (Signed)
Cardiology Office Note:    Date:  02/16/2020   ID:  Monica Dorsey, DOB Mar 22, 1961, MRN [id number]  PCP:  Greig Right, MD  Cardiologist:  Jenean Lindau, MD   Referring MD: Esperanza Richters, NP    ASSESSMENT:    1. Mixed hyperlipidemia   2. Palpitations    PLAN:    In order of problems listed above:  1. Primary prevention stressed with the patient.  Importance of compliance with diet and medication stressed and she vocalized understanding.  I told her to walk at least half an hour a day on a regular basis without any interruption at least 5 days a week and she promises to do so. 2. Palpitations: They are unremarkable.  They appear very benign and she has not had the symptoms.  TSH was fine according to the patient and therefore I do not see any value for event monitoring at this time.  I discussed this with her if her symptoms recur we will definitely consider this. 3. Mixed dyslipidemia: Diet was discussed she will have blood work in the next few days at her primary care doctor's office and send me a copy. 4. Cardiac murmur: Echocardiogram will be done to assess murmur heard on auscultation. 5. In view of palpitations I gave her a prescription for metoprolol tartrate 25 mg to be used on a as needed basis.  I also told her that she can takes alprazolam 0.125 mg on a as needed basis.  Precautions explained including drowsiness she understands 6. Patient will be seen in follow-up appointment in 6 weeks or earlier if the patient has any concerns    Medication Adjustments/Labs and Tests Ordered: Current medicines are reviewed at length with the patient today.  Concerns regarding medicines are outlined above.  No orders of the defined types were placed in this encounter.  No orders of the defined types were placed in this encounter.    History of Present Illness:    Monica Dorsey is a 59 y.o. female who is being seen today for the evaluation of palpitations at the request of  Wilburn, Nita Sells, NP.  Patient is a pleasant 59 year old female.  She has past medical history of mixed dyslipidemia.  She is an active lady.  She mentions to me that work has been very stressful and she is very overwhelmed at work.  She was doing her usual work and had palpitations and was very concerned and went to the emergency room.  I reviewed the emergency room records extensively.  Lab work was reviewed EKG was reviewed she also mentions to me that her thyroid testing recently was unremarkable.  Since that event she has not had any palpitations.  No chest pain orthopnea or PND.  Time even on exertion she has more chest pain.  Past Medical History:  Diagnosis Date  . Hyperlipidemia     History reviewed. No pertinent surgical history.  Current Medications: Current Meds  Medication Sig  . atorvastatin (LIPITOR) 20 MG tablet Take 20 mg by mouth daily.     Allergies:   Amoxicillin   Social History   Socioeconomic History  . Marital status: Married    Spouse name: Not on file  . Number of children: Not on file  . Years of education: Not on file  . Highest education level: Not on file  Occupational History  . Not on file  Tobacco Use  . Smoking status: Never Smoker  . Smokeless tobacco: Never Used  Substance and Sexual Activity  . Alcohol use: Not Currently  . Drug use: Never  . Sexual activity: Not on file  Other Topics Concern  . Not on file  Social History Narrative  . Not on file   Social Determinants of Health   Financial Resource Strain:   . Difficulty of Paying Living Expenses:   Food Insecurity:   . Worried About Charity fundraiser in the Last Year:   . Arboriculturist in the Last Year:   Transportation Needs:   . Film/video editor (Medical):   Marland Kitchen Lack of Transportation (Non-Medical):   Physical Activity:   . Days of Exercise per Week:   . Minutes of Exercise per Session:   Stress:   . Feeling of Stress :   Social Connections:   . Frequency of  Communication with Friends and Family:   . Frequency of Social Gatherings with Friends and Family:   . Attends Religious Services:   . Active Member of Clubs or Organizations:   . Attends Archivist Meetings:   Marland Kitchen Marital Status:      Family History: The patient's family history includes Hypercholesterolemia in her mother; Hypertension in her mother.  ROS:   Please see the history of present illness.    All other systems reviewed and are negative.  EKGs/Labs/Other Studies Reviewed:    The following studies were reviewed today: EKG reveals sinus rhythm and nonspecific ST-T changes   Recent Labs: 02/07/2020: BUN 16; Creatinine, Ser 0.88; Hemoglobin 14.5; Platelets 230; Potassium 3.6; Sodium 141  Recent Lipid Panel No results found for: CHOL, TRIG, HDL, CHOLHDL, VLDL, LDLCALC, LDLDIRECT  Physical Exam:    VS:  BP 124/84   Pulse 96   Ht 5\' 3"  (1.6 m)   Wt 116 lb (52.6 kg)   SpO2 99%   BMI 20.55 kg/m     Wt Readings from Last 3 Encounters:  02/16/20 116 lb (52.6 kg)  02/07/20 119 lb (54 kg)     GEN: Patient is in no acute distress HEENT: Normal NECK: No JVD; No carotid bruits LYMPHATICS: No lymphadenopathy CARDIAC: S1 S2 regular, 2/6 systolic murmur at the apex. RESPIRATORY:  Clear to auscultation without rales, wheezing or rhonchi  ABDOMEN: Soft, non-tender, non-distended MUSCULOSKELETAL:  No edema; No deformity  SKIN: Warm and dry NEUROLOGIC:  Alert and oriented x 3 PSYCHIATRIC:  Normal affect    Signed, Jenean Lindau, MD  02/16/2020 9:33 AM    [place]

## 2020-03-06 ENCOUNTER — Ambulatory Visit (INDEPENDENT_AMBULATORY_CARE_PROVIDER_SITE_OTHER): Payer: BC Managed Care – PPO

## 2020-03-06 ENCOUNTER — Other Ambulatory Visit: Payer: Self-pay

## 2020-03-06 DIAGNOSIS — R002 Palpitations: Secondary | ICD-10-CM | POA: Diagnosis not present

## 2020-03-06 NOTE — Progress Notes (Signed)
2D echocardiogram has been performed. 

## 2020-03-25 ENCOUNTER — Other Ambulatory Visit: Payer: Self-pay | Admitting: Cardiology

## 2020-03-29 ENCOUNTER — Other Ambulatory Visit: Payer: Self-pay

## 2020-03-29 ENCOUNTER — Ambulatory Visit (INDEPENDENT_AMBULATORY_CARE_PROVIDER_SITE_OTHER): Payer: BC Managed Care – PPO | Admitting: Cardiology

## 2020-03-29 ENCOUNTER — Encounter: Payer: Self-pay | Admitting: Cardiology

## 2020-03-29 VITALS — BP 108/70 | HR 80 | Ht 63.0 in | Wt 118.0 lb

## 2020-03-29 DIAGNOSIS — R002 Palpitations: Secondary | ICD-10-CM

## 2020-03-29 DIAGNOSIS — E782 Mixed hyperlipidemia: Secondary | ICD-10-CM | POA: Diagnosis not present

## 2020-03-29 NOTE — Patient Instructions (Signed)
Medication Instructions:  No medication changes. *If you need a refill on your cardiac medications before your next appointment, please call your pharmacy*   Lab Work: None ordered If you have labs (blood work) drawn today and your tests are completely normal, you will receive your results only by: Marland Kitchen MyChart Message (if you have MyChart) OR . A paper copy in the mail If you have any lab test that is abnormal or we need to change your treatment, we will call you to review the results.   Testing/Procedures: None ordered   Follow-Up: At [facility], you and your health needs are our priority.  As part of our continuing mission to provide you with exceptional heart care, we have created designated Provider Care Teams.  These Care Teams include your primary Cardiologist (physician) and Advanced Practice Providers (APPs -  Physician Assistants and Nurse Practitioners) who all work together to provide you with the care you need, when you need it.  We recommend signing up for the patient portal called "MyChart".  Sign up information is provided on this After Visit Summary.  MyChart is used to connect with patients for Virtual Visits (Telemedicine).  Patients are able to view lab/test results, encounter notes, upcoming appointments, etc.  Non-urgent messages can be sent to your provider as well.   To learn more about what you can do with MyChart, go to [email or web address].    Your next appointment:   1 year(s)  The format for your next appointment:   In Person  Provider:   Jyl Heinz, MD   Other Instructions NA

## 2020-03-29 NOTE — Progress Notes (Signed)
Cardiology Office Note:    Date:  03/29/2020   ID:  Monica Dorsey, DOB 1961-07-06, MRN [id number]  PCP:  Greig Right, MD  Cardiologist:  Jenean Lindau, MD   Referring MD: Greig Right, MD    ASSESSMENT:    1. Mixed hyperlipidemia   2. Palpitations    PLAN:    In order of problems listed above:  1. Primary prevention stressed with the patient.  Importance of compliance with diet medication stressed and she vocalized understanding.  Importance of regular exercise stressed and she is walking half an hour every day 5-6 times a week. 2. Mixed dyslipidemia: Diet was emphasized she recently had blood work and I reviewed her blood work including lipids and TSH from the [facility] months and they are fine.  This will be followed by primary care physician. 3. Patient will be seen in follow-up appointment in 12 months or earlier if the patient has any concerns    Medication Adjustments/Labs and Tests Ordered: Current medicines are reviewed at length with the patient today.  Concerns regarding medicines are outlined above.  No orders of the defined types were placed in this encounter.  No orders of the defined types were placed in this encounter.    No chief complaint on file.    History of Present Illness:    Monica Dorsey is a 59 y.o. female.  Patient has past medical history of palpitations and hyperlipidemia.  She denies any problems at this time and takes care of activities of daily living.  No chest pain orthopnea or PND.  The last time I saw her she was under significant amount of stress and had palpitations she still tells me that her workplace environment has gotten much better.  She feels much better.  No chest pain orthopnea or PND.  At the time of my evaluation, the patient is alert awake oriented and in no distress.  Past Medical History:  Diagnosis Date  . Hyperlipidemia     History reviewed. No pertinent surgical history.  Current Medications: Current Meds    Medication Sig  . ALPRAZolam (XANAX) 0.25 MG tablet Take 0.5 tablets (0.125 mg total) by mouth at bedtime as needed for anxiety.  Marland Kitchen atorvastatin (LIPITOR) 20 MG tablet Take 20 mg by mouth daily.  . metoprolol tartrate (LOPRESSOR) 25 MG tablet TAKE AS NEEDED FOR RAPID HEART RATE.     Allergies:   Amoxicillin   Social History   Socioeconomic History  . Marital status: Married    Spouse name: Not on file  . Number of children: Not on file  . Years of education: Not on file  . Highest education level: Not on file  Occupational History  . Not on file  Tobacco Use  . Smoking status: Never Smoker  . Smokeless tobacco: Never Used  Substance and Sexual Activity  . Alcohol use: Not Currently  . Drug use: Never  . Sexual activity: Not on file  Other Topics Concern  . Not on file  Social History Narrative  . Not on file   Social Determinants of Health   Financial Resource Strain:   . Difficulty of Paying Living Expenses:   Food Insecurity:   . Worried About Charity fundraiser in the Last Year:   . Arboriculturist in the Last Year:   Transportation Needs:   . Film/video editor (Medical):   Marland Kitchen Lack of Transportation (Non-Medical):   Physical Activity:   . Days of Exercise  per Week:   . Minutes of Exercise per Session:   Stress:   . Feeling of Stress :   Social Connections:   . Frequency of Communication with Friends and Family:   . Frequency of Social Gatherings with Friends and Family:   . Attends Religious Services:   . Active Member of Clubs or Organizations:   . Attends Archivist Meetings:   Marland Kitchen Marital Status:      Family History: The patient's family history includes Hypercholesterolemia in her mother; Hypertension in her mother.  ROS:   Please see the history of present illness.    All other systems reviewed and are negative.  EKGs/Labs/Other Studies Reviewed:    The following studies were reviewed today: IMPRESSIONS    1. Left ventricular  ejection fraction, by estimation, is 55 to 60%. The  left ventricle has normal function. The left ventricle has no regional  wall motion abnormalities. Left ventricular diastolic parameters are  consistent with Grade I diastolic  dysfunction (impaired relaxation).  2. Right ventricular systolic function is normal. The right ventricular  size is normal.  3. The mitral valve is normal in structure. No evidence of mitral valve  regurgitation. No evidence of mitral stenosis.  4. The aortic valve is tricuspid. Aortic valve regurgitation is not  visualized. No aortic stenosis is present.  5. The inferior vena cava is normal in size with greater than 50%  respiratory variability, suggesting right atrial pressure of 3 mmHg.   Recent Labs: 02/07/2020: BUN 16; Creatinine, Ser 0.88; Hemoglobin 14.5; Platelets 230; Potassium 3.6; Sodium 141  Recent Lipid Panel No results found for: CHOL, TRIG, HDL, CHOLHDL, VLDL, LDLCALC, LDLDIRECT  Physical Exam:    VS:  BP 108/70   Pulse 80   Ht 5\' 3"  (1.6 m)   Wt 118 lb (53.5 kg)   SpO2 98%   BMI 20.90 kg/m     Wt Readings from Last 3 Encounters:  03/29/20 118 lb (53.5 kg)  02/16/20 116 lb (52.6 kg)  02/07/20 119 lb (54 kg)     GEN: Patient is in no acute distress HEENT: Normal NECK: No JVD; No carotid bruits LYMPHATICS: No lymphadenopathy CARDIAC: Hear sounds regular, 2/6 systolic murmur at the apex. RESPIRATORY:  Clear to auscultation without rales, wheezing or rhonchi  ABDOMEN: Soft, non-tender, non-distended MUSCULOSKELETAL:  No edema; No deformity  SKIN: Warm and dry NEUROLOGIC:  Alert and oriented x 3 PSYCHIATRIC:  Normal affect   Signed, Jenean Lindau, MD  03/29/2020 9:04 AM    [place]

## 2020-04-29 ENCOUNTER — Other Ambulatory Visit: Payer: Self-pay | Admitting: Cardiology

## 2020-07-24 ENCOUNTER — Other Ambulatory Visit: Payer: Self-pay | Admitting: Obstetrics and Gynecology

## 2020-07-24 DIAGNOSIS — Z1231 Encounter for screening mammogram for malignant neoplasm of breast: Secondary | ICD-10-CM

## 2020-09-03 ENCOUNTER — Other Ambulatory Visit: Payer: Self-pay

## 2020-09-03 ENCOUNTER — Ambulatory Visit
Admission: RE | Admit: 2020-09-03 | Discharge: 2020-09-03 | Disposition: A | Discharge status: Home / Self Care | Payer: BC Managed Care – PPO | Source: Ambulatory Visit | Attending: Obstetrics and Gynecology | Admitting: Obstetrics and Gynecology

## 2020-09-03 DIAGNOSIS — Z1231 Encounter for screening mammogram for malignant neoplasm of breast: Secondary | ICD-10-CM

## 2021-04-26 ENCOUNTER — Other Ambulatory Visit: Payer: Self-pay

## 2021-04-29 ENCOUNTER — Encounter: Payer: Self-pay | Admitting: Cardiology

## 2021-04-29 ENCOUNTER — Ambulatory Visit (INDEPENDENT_AMBULATORY_CARE_PROVIDER_SITE_OTHER): Payer: BC Managed Care – PPO | Admitting: Cardiology

## 2021-04-29 ENCOUNTER — Other Ambulatory Visit: Payer: Self-pay

## 2021-04-29 VITALS — BP 114/74 | HR 87 | Ht 63.0 in | Wt 124.2 lb

## 2021-04-29 DIAGNOSIS — E782 Mixed hyperlipidemia: Secondary | ICD-10-CM | POA: Diagnosis not present

## 2021-04-29 DIAGNOSIS — R002 Palpitations: Secondary | ICD-10-CM

## 2021-04-29 DIAGNOSIS — I700 Atherosclerosis of aorta: Secondary | ICD-10-CM | POA: Diagnosis not present

## 2021-04-29 NOTE — Progress Notes (Signed)
Cardiology Office Note:    Date:  04/29/2021   ID:  [name], DOB [birth year], MRN [id number]  PCP:  Greig Right, MD  Cardiologist:  Jenean Lindau, MD   Referring MD: Greig Right, MD    ASSESSMENT:    1. Mixed hyperlipidemia   2. Palpitations    PLAN:    In order of problems listed above:  Primary prevention stressed with the patient.  Importance of compliance with diet medication stressed and she vocalized understanding.  She was advised to walk at least half an hour a day 5 days a week and she promises to do so. Mixed dyslipidemia: Diet was emphasized.  Lipids from [facility] were reviewed. Palpitations: These have resolved and the patient is happy about it.  She wants a refill of metoprolol as needed prescription and we will do this for her. Coronary risk stratification: Calcium scoring was advised and she agrees to do this. Patient will be seen in follow-up appointment in 12 months or earlier if the patient has any concerns    Medication Adjustments/Labs and Tests Ordered: Current medicines are reviewed at length with the patient today.  Concerns regarding medicines are outlined above.  No orders of the defined types were placed in this encounter.  No orders of the defined types were placed in this encounter.    No chief complaint on file.    History of Present Illness:    Monica Dorsey is a 60 y.o. female.  Patient has past medical history of palpitations and mixed dyslipidemia.  She denies any problems at this time and takes care of activities of daily living.  No chest pain orthopnea or PND.  She has not had any palpitations.  She is happy about it.  At the time of my evaluation, the patient is alert awake oriented and in no distress.  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   Cholecalciferol (VITAMIN D) 50 MCG (2000 UT) CAPS Take 2,000 Units by mouth daily.   metoprolol tartrate (LOPRESSOR) 25 MG tablet Take 25 mg by mouth as needed for heart rate control.   Multiple Vitamin (MULTIVITAMIN) tablet Take 1 tablet by mouth daily.   pravastatin (PRAVACHOL) 10 MG tablet Take 10 mg by mouth daily.     Allergies:   Amoxicillin   Social History   Socioeconomic History   Marital status: Married    Spouse name: Not on file   Number of children: Not on file   Years of education: Not on file   Highest education level: Not on file  Occupational History   Not on file  Tobacco Use   Smoking status: Never   Smokeless tobacco: Never  Substance and Sexual Activity   Alcohol use: Not Currently   Drug use: Never   Sexual activity: Not on file  Other Topics Concern   Not on file  Social History Narrative   Not on file  Social Determinants of Health   Financial Resource Strain: Not on file  Food Insecurity: Not on file  Transportation Needs: Not on file  Physical Activity: Not on file  Stress: Not on file  Social Connections: Not on file     Family History: The patient's family history includes Hypercholesterolemia in her mother; Hypertension in her mother. There is no history of Breast cancer.  ROS:   Please see the history of present illness.    All other systems reviewed and are negative.  EKGs/Labs/Other Studies Reviewed:    The following studies were reviewed today: I discussed my findings with the patient at length and reviewed lipids.  EKG reveals sinus rhythm and nonspecific ST-T changes.   Recent Labs: No results  found for requested labs within last 8760 hours.  Recent Lipid Panel No results found for: CHOL, TRIG, HDL, CHOLHDL, VLDL, LDLCALC, LDLDIRECT  Physical Exam:    VS:  BP 114/74   Pulse 87   Ht 5\' 3"  (1.6 m)   Wt 124 lb 3.2 oz (56.3 kg)   SpO2 99%   BMI 22.00 kg/m     Wt Readings from Last 3 Encounters:  04/29/21 124 lb 3.2 oz (56.3 kg)  03/29/20 118 lb (53.5 kg)  02/16/20 116 lb (52.6 kg)     GEN: Patient is in no acute distress HEENT: Normal NECK: No JVD; No carotid bruits LYMPHATICS: No lymphadenopathy CARDIAC: Hear sounds regular, 2/6 systolic murmur at the apex. RESPIRATORY:  Clear to auscultation without rales, wheezing or rhonchi  ABDOMEN: Soft, non-tender, non-distended MUSCULOSKELETAL:  No edema; No deformity  SKIN: Warm and dry NEUROLOGIC:  Alert and oriented x 3 PSYCHIATRIC:  Normal affect   Signed, Jenean Lindau, MD  04/29/2021 2:09 PM    [place]

## 2021-04-29 NOTE — Addendum Note (Signed)
Addended by: Truddie Hidden on: 04/29/2021 02:23 PM   Modules accepted: Orders

## 2021-04-29 NOTE — Patient Instructions (Signed)
Medication Instructions:  No medication changes. *If you need a refill on your cardiac medications before your next appointment, please call your pharmacy*   Lab Work: None ordered If you have labs (blood work) drawn today and your tests are completely normal, you will receive your results only by: [place] (if you have MyChart) OR A paper copy in the mail If you have any lab test that is abnormal or we need to change your treatment, we will call you to review the results.   Testing/Procedures:  We will order CT coronary calcium score. It will cost $99.00 and is not covered by insurance.  Please call [phone] to schedule.   [facility]  [address], [place] [address]    Follow-Up: At [facility], you and your health needs are our priority.  As part of our continuing mission to provide you with exceptional heart care, we have created designated Provider Care Teams.  These Care Teams include your primary Cardiologist (physician) and Advanced Practice Providers (APPs -  Physician Assistants and Nurse Practitioners) who all work together to provide you with the care you need, when you need it.  We recommend signing up for the patient portal called "MyChart".  Sign up information is provided on this After Visit Summary.  MyChart is used to connect with patients for Virtual Visits (Telemedicine).  Patients are able to view lab/test results, encounter notes, upcoming appointments, etc.  Non-urgent messages can be sent to your provider as well.   To learn more about what you can do with MyChart, go to [email or web address].    Your next appointment:   12 month(s)  The format for your next appointment:   In Person  Provider:   Jyl Heinz, MD   Other Instructions  Coronary Calcium Scan A coronary calcium scan is an imaging test used to look for deposits of plaque in the inner lining of the blood vessels of the heart (coronary arteries).  Plaque is made up of calcium, protein, and fatty substances. These deposits of plaque can partly clog and narrow the coronary arteries without producing any symptoms or warning signs. This puts a person at risk for a heart attack. This test is recommended for people who are at moderate risk for heart disease. The test can find plaque deposits before symptoms develop. Tell a health care provider about: Any allergies you have. All medicines you are taking, including vitamins, herbs, eye drops, creams, and over-the-counter medicines. Any problems you or family members have had with anesthetic medicines. Any blood disorders you have. Any surgeries you have had. Any medical conditions you have. Whether you are pregnant or may be pregnant. What are the risks? Generally, this is a safe procedure. However, problems may occur, including: Harm to a pregnant woman and her unborn baby. This test involves the use of radiation. Radiation exposure can be dangerous to a pregnant woman and her unborn baby. If you are pregnant or think you may be pregnant, you should not have this procedure done. Slight increase in the risk of cancer. This is because of the radiation involved in the test. What happens before the procedure? Ask your health care provider for any specific instructions on how to prepare for this procedure. You may be asked to avoid products that contain caffeine, tobacco, or nicotine for 4 hours before the procedure. What happens during the procedure? You will undress and remove any jewelry from your neck or chest. You will put on  a hospital gown. Sticky electrodes will be placed on your chest. The electrodes will be connected to an electrocardiogram (ECG) machine to record a tracing of the electrical activity of your heart. You will lie down on a curved bed that is attached to the [place]. You may be given medicine to slow down your heart rate so that clear pictures can be created. You will be  moved into the CT scanner, and the CT scanner will take pictures of your heart. During this time, you will be asked to lie still and hold your breath for 2-3 seconds at a time while each picture of your heart is being taken. The procedure may vary among health care providers and hospitals.    What happens after the procedure? You can get dressed. You can return to your normal activities. It is up to you to get the results of your procedure. Ask your health care provider, or the department that is doing the procedure, when your results will be ready. Summary A coronary calcium scan is an imaging test used to look for deposits of plaque in the inner lining of the blood vessels of the heart (coronary arteries). Plaque is made up of calcium, protein, and fatty substances. Generally, this is a safe procedure. Tell your health care provider if you are pregnant or may be pregnant. Ask your health care provider for any specific instructions on how to prepare for this procedure. A CT scanner will take pictures of your heart. You can return to your normal activities after the scan is done. This information is not intended to replace advice given to you by your health care provider. Make sure you discuss any questions you have with your health care provider. Document Revised: 04/19/2019 Document Reviewed: 04/19/2019 Elsevier Patient Education  [place].

## 2021-05-01 ENCOUNTER — Inpatient Hospital Stay: Admit: 2021-05-01 | Payer: PRIVATE HEALTH INSURANCE | Primary: Physician

## 2021-05-01 ENCOUNTER — Ambulatory Visit: Admit: 2021-05-01 | Discharge: 2021-05-08 | Attending: Physician | Primary: Physician

## 2021-05-01 DIAGNOSIS — Z09 Encounter for follow-up examination after completed treatment for conditions other than malignant neoplasm: Secondary | ICD-10-CM

## 2021-05-01 MED ORDER — CHOLECALCIFEROL (VITAMIN D3) 25 MCG (1,000 UNIT) TABLET
1000 UNITS | ORAL | Status: AC
Start: 2021-05-01 — End: ?

## 2021-05-01 NOTE — Progress Notes (Signed)
I saw Casey Ali today in the [facility]    [phone] s/p cervical laminoplasty presents for 3 years post-op visit.    The patient is doing well overall, but still have some neck and left arm/hand discomfort with over activities. Her cervical xrays showed good cervical lordosis and stable laminplasty implants.       Physical Exam     AAOx3  MAE 5/5  Incision CID      DIAGNOSIS:   Surgery follow-up    ASSESSMENT AND PLAN: Casey Ali s/p cervical laminoplasty presents for 3 years post-op visit.    Doing well, can continue PT for neck extensor muscle strengthening. Follow up 5 years post-op.    I spent a total of 20 minutes on this patient's care on the day of their visit excluding time spent related to any billed procedures. This time includes time spent with the patient as well as time spent documenting in the medical record, reviewing patient's records and tests, obtaining history, placing orders, communicating with other healthcare professionals, counseling the patient, family, or caregiver, and/or care coordination for the diagnoses above.

## 2021-07-02 ENCOUNTER — Ambulatory Visit: Payer: Vision Other Private Insurance | Admitting: OPTOMETRIST

## 2021-07-02 DIAGNOSIS — H524 Presbyopia: Secondary | ICD-10-CM

## 2021-07-02 DIAGNOSIS — H02886 Meibomian gland dysfunction of left eye, unspecified eyelid: Secondary | ICD-10-CM

## 2021-07-02 DIAGNOSIS — H02883 Meibomian gland dysfunction of right eye, unspecified eyelid: Secondary | ICD-10-CM

## 2021-07-02 DIAGNOSIS — L718 Other rosacea: Secondary | ICD-10-CM

## 2021-07-02 DIAGNOSIS — H0288B Meibomian gland dysfunction left eye, upper and lower eyelids: Secondary | ICD-10-CM

## 2021-07-02 DIAGNOSIS — H1045 Other chronic allergic conjunctivitis: Secondary | ICD-10-CM

## 2021-07-02 NOTE — Progress Notes (Addendum)
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      Today patient reports here to check vision. Pt states that vision has decreased.    *Note: a mask was worn by both provider and patient throughout the duration of the examination    Ocular Medications:  Pataday daily OU  Warm compress daily OU  Eyelid hygiene daily OU    Past Ocular History:  PVD OD  Allergic conjunctivitis OU  Mild ocular rosacea and MGD OU    See exam template    Patient Active Problem List   Diagnosis    Rosacea    Low vitamin B12 level    Degenerative cervical disc    Cervical radicular pain    Allergic rhinitis due to allergen    Acute atopic conjunctivitis    Asthma, cough variant    Allergy to mites    Dermatochalasis of both eyelids    Brow ptosis    Facial aging    Dermatochalasis of eyelids of both eyes    Lateral epicondylitis of right elbow    Trochanteric bursitis of right hip    Allergic conjunctivitis     Impression/Plan:  1. Presbyopia  -   Rx spectacles (SVD and SV Computer). +1.50 ADD. Adaptation expected.  -   Pt prefers to remove srx for near.  -   Rx Oasys for Presbyopia (Med ADD OD/OS) OU. No rx change. Cont Clear Care.  -   Pt is also 1-Day Moist Multifocal (Med ADD OD/OS) CL wearer OU. Pt prefers to switch between 2 week and DD replacement. Pt requests to trial alternative DD CL. Discussed CL Options. Dispensed trial DT1 Multifocal (Med ADD OD/OS) OU. No rx change.    Finalized CL's:  OD: Oasys for Presbyopia / 8.4 / 14.3 / -3.50 DS, Med ADD   OS: Oasys for Presbyopia / 8.4 / 14.3 / -3.50 DS, Med ADD     AND    OD: 1-Day Moist Multifocal / 8.4 / 14.3 / -3.50 DS, Med ADD   OS: 1-Day Moist Multifocal / 8.4 / 14.3 / -3.50 DS, Med ADD    ----------------    Dispensed trial:  OD: DT1 Multifocal / 8.5 / 14.1 / -3.50 DS, Med ADD   OS: DT1 Multifocal / 8.5 / 14.1 / -3.50 DS, Med ADD     *Pt will cont w/  Oasys for Presbyopia (2 week)  *Pt will trial DT1 (DD) and contact Dr. Harrington Challenger if she prefers to switch from Oasys 1-Day to [place].    ------------------    2. Mild ocular rosacea and MGD OU  -    Current tx: see ocular meds (above). Discussed condition. Cont current tx. Consider lipid based AT's, oral doxycycline/azithromycin, Restasis/Xiidra. RTC if symptoms worsen.    *Confirmed rosacea per dermatology    3. Perennial allergic conjunctivitis OU  -   Current tx: see ocular meds (above). Discussed condition. Cont current tx. Consider Pataday extra strength. RTC if symptoms worsen.    Follow-Up:  1. 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  *Former Nurse, learning disability, [place], [place], FAAO  Principal Optometrist  Diplomate I Tax adviser of [place] [facility] I Department of Ophthalmology    ------------------------  Addendum: 07/15/2021  Pt requests to finalize 3 CL's rxs:    OD: Oasys for Presbyopia / 8.4 / 14.3 / -3.50 DS, Med ADD   OS: Oasys for Presbyopia / 8.4 / 14.3 / -3.50 DS, Med ADD     AND    OD: 1-Day Moist Multifocal / 8.4 / 14.3 / -3.50 DS, Med ADD   OS: 1-Day Moist Multifocal / 8.4 / 14.3 / -3.50 DS, Med ADD    AND    OD: DT1 Multifocal / 8.5 / 14.1 / -3.50 DS, Med ADD   OS: DT1 Multifocal / 8.5 / 14.1 / -3.50 DS, Med ADD     RTC PRN      Trina Ao, OD, MS, FAAO  Principal Optometrist  Diplomate I Tax adviser of Optometry  [place] [employer] I Department of Ophthalmology

## 2021-07-25 ENCOUNTER — Ambulatory Visit (INDEPENDENT_AMBULATORY_CARE_PROVIDER_SITE_OTHER)
Admission: RE | Admit: 2021-07-25 | Discharge: 2021-07-25 | Disposition: A | Discharge status: Home / Self Care | Payer: Self-pay | Source: Ambulatory Visit | Attending: Cardiology | Admitting: Cardiology

## 2021-07-25 ENCOUNTER — Other Ambulatory Visit: Payer: Self-pay

## 2021-07-25 DIAGNOSIS — E782 Mixed hyperlipidemia: Secondary | ICD-10-CM

## 2021-07-29 NOTE — Addendum Note (Signed)
Addended by: Truddie Hidden on: 07/29/2021 09:49 AM   Modules accepted: Orders

## 2021-08-06 ENCOUNTER — Other Ambulatory Visit: Payer: Self-pay | Admitting: Obstetrics and Gynecology

## 2021-08-06 DIAGNOSIS — Z1231 Encounter for screening mammogram for malignant neoplasm of breast: Secondary | ICD-10-CM

## 2021-09-13 ENCOUNTER — Ambulatory Visit
Admission: RE | Admit: 2021-09-13 | Discharge: 2021-09-13 | Disposition: A | Discharge status: Home / Self Care | Payer: BC Managed Care – PPO | Source: Ambulatory Visit | Attending: Obstetrics and Gynecology | Admitting: Obstetrics and Gynecology

## 2021-09-13 DIAGNOSIS — Z1231 Encounter for screening mammogram for malignant neoplasm of breast: Secondary | ICD-10-CM

## 2022-07-30 ENCOUNTER — Telehealth: Payer: Self-pay | Admitting: Family Medicine

## 2022-07-30 DIAGNOSIS — Z0000 Encounter for general adult medical examination without abnormal findings: Secondary | ICD-10-CM

## 2022-07-30 NOTE — Telephone Encounter (Signed)
Ok to re-est care

## 2022-07-30 NOTE — Telephone Encounter (Addendum)
General Advice / Message to MD:    Patient is calling wanting to know if Dr. Sarita Bottom would be ok to re-establish with patient since last appointment with her was 05/28/18.    She was informed she is closed to new pt's but she would like to know since she says she feels comfortable going with Dr. Sarita Bottom.    Pt requests a phone call as mychart is not active.    Ph# [phone]    [place] [facility]  Patient Rebecca Warner
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
## 2022-08-01 NOTE — Telephone Encounter (Signed)
Left message to call back to relay Dr. Sonia Side message:       Fasting labs ordered     Please relay message if she calls back.      Sander Radon, LVN

## 2022-08-01 NOTE — Telephone Encounter (Signed)
Fasting labs ordered. Please inform patient.

## 2022-08-04 NOTE — Telephone Encounter (Signed)
Left 2nd message to call back to relay Dr. Sonia Side message:         Fasting labs ordered      Please relay message if she calls back.        Sander Radon, LVN

## 2022-08-07 ENCOUNTER — Other Ambulatory Visit: Payer: Self-pay | Admitting: Obstetrics and Gynecology

## 2022-08-07 DIAGNOSIS — Z1231 Encounter for screening mammogram for malignant neoplasm of breast: Secondary | ICD-10-CM

## 2022-09-23 ENCOUNTER — Ambulatory Visit: Payer: BC Managed Care – PPO | Admitting: Family Medicine

## 2022-10-01 ENCOUNTER — Ambulatory Visit
Admission: RE | Admit: 2022-10-01 | Discharge: 2022-10-01 | Disposition: A | Discharge status: Home / Self Care | Payer: BC Managed Care – PPO | Source: Ambulatory Visit | Attending: Obstetrics and Gynecology | Admitting: Obstetrics and Gynecology

## 2022-10-01 DIAGNOSIS — Z1231 Encounter for screening mammogram for malignant neoplasm of breast: Secondary | ICD-10-CM

## 2022-10-21 ENCOUNTER — Ambulatory Visit: Payer: No Typology Code available for payment source | Attending: Family Medicine | Admitting: Family Medicine

## 2022-10-21 ENCOUNTER — Ambulatory Visit (INDEPENDENT_AMBULATORY_CARE_PROVIDER_SITE_OTHER): Payer: No Typology Code available for payment source

## 2022-10-21 VITALS — BP 170/84 | HR 78 | Resp 16 | Ht 63.5 in | Wt 136.7 lb

## 2022-10-21 DIAGNOSIS — R011 Cardiac murmur, unspecified: Secondary | ICD-10-CM

## 2022-10-21 DIAGNOSIS — Z0000 Encounter for general adult medical examination without abnormal findings: Secondary | ICD-10-CM

## 2022-10-21 DIAGNOSIS — R1013 Epigastric pain: Secondary | ICD-10-CM

## 2022-10-21 DIAGNOSIS — Z124 Encounter for screening for malignant neoplasm of cervix: Secondary | ICD-10-CM

## 2022-10-21 DIAGNOSIS — M5412 Radiculopathy, cervical region: Secondary | ICD-10-CM | POA: Insufficient documentation

## 2022-10-21 DIAGNOSIS — Z23 Encounter for immunization: Secondary | ICD-10-CM | POA: Insufficient documentation

## 2022-10-21 DIAGNOSIS — Z1239 Encounter for other screening for malignant neoplasm of breast: Secondary | ICD-10-CM | POA: Insufficient documentation

## 2022-10-21 LAB — COMPREHENSIVE METABOLIC PANEL
Alanine Transferase (ALT): 13 U/L (ref <=33)
Albumin: 4.7 g/dL (ref 4.0–4.9)
Alkaline Phosphatase (ALP): 43 U/L (ref 35–129)
Anion Gap: 11 mmol/L (ref 7–15)
Aspartate Transaminase (AST): 17 U/L (ref <=41)
Calcium: 9.6 mg/dL (ref 8.6–10.0)
Chloride: 105 mmol/L (ref 98–107)
Potassium: 4.5 mmol/L (ref 3.4–5.1)
Sodium: 141 mmol/L (ref 136–145)

## 2022-10-21 LAB — CBC WITH DIFFERENTIAL
Basophils % Auto: 0.4 %
Basophils Abs Auto: 0 10*3/uL (ref 0.0–0.2)
Eosinophils % Auto: 0.5 %
Eosinophils Abs Auto: 0 10*3/uL (ref 0.0–0.5)
Hematocrit: 42.7 % (ref 36.0–46.0)
Hemoglobin: 14.5 g/dL (ref 12.0–16.0)
Lymphocytes % Auto: 21.1 %
Lymphocytes Abs Auto: 1.2 10*3/uL (ref 1.0–4.8)
MCH: 29.8 pg (ref 27.0–33.0)
MCHC: 34 % (ref 32.0–36.0)
MCV: 87.5 fL (ref 80.0–100.0)
MPV: 8.9 fL (ref 6.8–10.0)
Monocytes % Auto: 6.8 %
Monocytes Abs Auto: 0.4 10*3/uL (ref 0.1–0.8)
Neutrophils % Auto: 71.2 %
Neutrophils Abs Auto: 4 10*3/uL (ref 1.8–7.7)
Platelet Count: 264 10*3/uL (ref 130–400)
RDW: 13 % (ref 0.0–14.7)
Red Blood Cell Count: 4.88 10*6/uL (ref 4.00–5.20)
White Blood Cell Count: 5.7 10*3/uL (ref 4.5–11.0)

## 2022-10-21 LAB — HEPATITIS C AB SCREEN: Hepatitis C Ab Screen: NONREACTIVE

## 2022-10-21 LAB — TSH WITH FREE T4 REFLEX: Thyroid Stimulating Hormone: 1.7 u[IU]/mL (ref 0.27–4.20)

## 2022-10-21 LAB — VITAMIN D, 25 HYDROXY: Vitamin D, 25 Hydroxy: 50.3 ng/mL — ABNORMAL HIGH (ref 10.0–50.0)

## 2022-10-21 MED ORDER — SUCRALFATE 1 GRAM TABLET: ORAL_TABLET | ORAL | 1 refills | Status: DC

## 2022-10-21 MED ORDER — SPIRONOLACTONE 25 MG TABLET: ORAL_TABLET | ORAL | 0 refills | Status: DC

## 2022-10-21 NOTE — Nursing Note (Signed)
Vitals Taken. Launi Asencio, MAII

## 2022-10-21 NOTE — Progress Notes (Signed)
ID/CC:Rebecca Warner is a 62yrold female here for annual.  Last seen 2019         S: patient doing well since [place] C3 and partial C7 lami, C4-6 laminoplasty with left side C3-7 foraminotomies with Dr. TMarti Sleigh2019.  Is having some anterior neck soreness since the surgery and given mother's history of thyroid issues is concerned about her thyroid  She is still having occassional epigastric pain were she takes carafate and would like refills.  Has never used PPI as is so infreq.  She thinks is stress related.  She takes the carafate for about one week and pain resolves althoug sh eis wondering about possible GB etio       Allergies:   Allergies   Allergen Reactions    Amoxicillin Hives    Pcn (Penicillin) [Penicillins] Rash    Peanuts [Peanut] Swelling    Seafood Swelling       Meds: Reviewed and updated in EPIC    PMH: PNA, Rosacea, severe cervical DJD, left ankle fracture -Judi Cong1/2020    [facility] C/s x2 , uterine polyp resection    OB/GYN:  G3P2; SAB1; historically normal paps, last done 2014 with cotesting, last mammo 7/17 - normal; husband with vasectomy    FAm History:  M- thyroid, HTN; F-healthy ; Bx1 - healthy; MGM - colon CA 80s; PGF - MI    Soc History: Married, 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gSurveyor, mining; tPharmacist, 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; nonsmoker; etoh -  light reg; exercise regularly    Colon screening - prior FIT    REVIEW OF SYSTEMS:neg other than subjective    OBJECTIVE:  BP (!) 170/84 (SITE: right arm, Orthostatic Position: sitting, Cuff Size: regular)   Pulse 78   Resp 16   Ht 1.613 m (5' 3.5")   Wt 62 kg (136 lb 11 oz)   LMP 04/07/2016   BMI 23.83 kg/m   GEN: NAD, Nondyspneic, Nonpallor, No juandice,  HEENT:  Head: NCAT  Eyes:PERRLA, EOMi, conj-clear; sclera - clear  Ears: normal auricle and tragus; hearing is grossly normal  Canals: without abnormalities  Tms: normal  Nose: nasal mucus normal, normal septum and turbinates  Mouth: normal appearance of the lips, no  cyanosis, no ulcers or lesions.  Oral mucosa is normal. No abscesses; no obvious caries.  Tongue is normal  Throat: moist without inflammation, hard and soft palates appear normal   Neck: supple without crepitus; symmetrical and trachea is midline.  + tenderness anterior cervical musculature   Thyroid: not enlarged, no nodules   No  Cervical or Supraclavicular LAD  No Carotid Bruits/JVD  Chest: no retractions, clear to ascultation; non dyspneic; no fremitus  Cardiac:  RRR w/+1-2 murmur   Abdomen:  Inspection: nondistended,   Bowel sounds: + throughout   Palpation: soft, nontender; No HSM, no hernias   Masses: none; no pulsatile mass   Murphy's sign; negative  Breasts: not done    Pelvic exam:not done       ASSESSMENT AND PLAN: 30 minutes were spent with patient/family in  face to face, over  50%  of time spent counseling or coordinating care.  Patient counseled regarding consultations and recommendations of such for her cerivcal spine injury  Annual - due for pap/breast exam - patient request gyn referral done. Mammo referral done.  Routine labs ordered.  COVID vaccine given.  Flu vaccine ordered as well as shingrix.  FIT ordered. Patient will fu after labs given  additional concerns beyond routine medical as below  Anterior neck soreness - no thyroid nodule noted.  May be sequela of cervical surgery.  Will check thyroid function.  Follow up afterwards   3. Murmur - not heard in the past - echo ordered   4. Ongoing epigastric pain with occasional need for carafate - refilled and h pylori stool antigen sent and patient follow up - will consider work up for GB if neg   5.  Elevated BP - patient states normotensive at home.  Would however like spironolactone refilled which she takes for fluid retention after eating out at restaurants   I did review patient's past medical and family/social history, no changes noted.    Barriers to Learning assessed: none. Patient verbalizes understanding of teaching and  instructions.    Electronically signed by  Grace Isaac. Theordore Cisnero, [facility]  [address], [place] [address]  [phone]

## 2022-10-21 NOTE — Nursing Note (Signed)
Dr.Henchell reviewed screening questions with patient. COVID-19 vaccine given per Dr. Henchell order. Medication verified by Dr. Henchell prior to administration. Pt instructed to wait in clinic for 15 minuets after receiving vaccine. Brewster Wolters,MAII

## 2022-10-22 LAB — HEMOGLOBIN A1C
Hgb A1C,Glucose Est Avg: 105 mg/dL
Hgb A1C: 5.3 % (ref 3.9–5.6)

## 2022-10-24 LAB — COMPREHENSIVE METABOLIC PANEL
Bilirubin Total: 0.4 mg/dL (ref <=1.2)
Carbon Dioxide Total: 25 mmol/L (ref 22–29)
Creatinine Serum: 0.71 mg/dL (ref 0.51–1.17)
Glucose: 103 mg/dL (ref 74–109)
Protein: 7.8 g/dL (ref 6.6–8.7)
Urea Nitrogen, Blood (BUN): 9 mg/dL (ref 6–20)

## 2022-10-31 ENCOUNTER — Ambulatory Visit: Payer: No Typology Code available for payment source

## 2022-10-31 ENCOUNTER — Ambulatory Visit: Payer: No Typology Code available for payment source | Attending: Family Medicine | Admitting: Family Medicine

## 2022-10-31 VITALS — BP 149/89 | HR 97 | Temp 98.0°F | Resp 16 | Wt 136.0 lb

## 2022-10-31 DIAGNOSIS — R221 Localized swelling, mass and lump, neck: Secondary | ICD-10-CM | POA: Insufficient documentation

## 2022-10-31 DIAGNOSIS — R1013 Epigastric pain: Secondary | ICD-10-CM | POA: Insufficient documentation

## 2022-10-31 DIAGNOSIS — R030 Elevated blood-pressure reading, without diagnosis of hypertension: Secondary | ICD-10-CM | POA: Insufficient documentation

## 2022-10-31 DIAGNOSIS — Z1239 Encounter for other screening for malignant neoplasm of breast: Secondary | ICD-10-CM

## 2022-10-31 DIAGNOSIS — Z0000 Encounter for general adult medical examination without abnormal findings: Secondary | ICD-10-CM

## 2022-10-31 MED ORDER — SUCRALFATE 1 GRAM TABLET: 1.0000 g | ORAL_TABLET | Freq: Four times a day (QID) | ORAL | 1 refills | Status: DC

## 2022-10-31 MED ORDER — SPIRONOLACTONE 25 MG TABLET: 25.0000 mg | ORAL_TABLET | Freq: Every day | ORAL | 1 refills | Status: DC

## 2022-10-31 NOTE — Nursing Note (Signed)
Vital signs taken,tobacco,allergies,pharmacy verified, screened for pain.  Cheralyn Oliver, MA

## 2022-10-31 NOTE — Progress Notes (Signed)
ID/CC:Rebecca Warner is a 62yrold female here for follow up labs          S: patient was [facility] able to [facility]  Not sure why but suspect may have been related to unconventional sig regarding patient use of medications PRN  Will redirect with conventional sig  Still with occassional epigastric pain.  Will last about one week.  Takes carafate and seems to resolve  Not able to assoc with meals.  No change with stooling.  No diarrhea  Is still having ant neck fullness.  Not sure if has thyroid issue or if related to sequela of cerivcal surgery     Home BP ranging 106-155/90-65; average 127/77, feels tha tshe gives herself white coat HTN taking it a home        Allergies:   Allergies   Allergen Reactions    Amoxicillin Hives    Pcn (Penicillin) [Penicillins] Rash    Peanuts [Peanut] Swelling    Seafood Swelling       Meds: Reviewed and updated in EPIC    PMH: PNA, Rosacea, severe cervical DJD, left ankle fracture -Judi Cong1/2020    [facility] , uterine polyp resection    OB/GYN:  G3P2; SAB1; historically normal paps, last done 2014 with cotesting, last mammo 7/17 - normal; husband with vasectomy    FAm History:  M- thyroid, HTN; F-healthy ; Bx1 - healthy; MGM - colon CA 80s; PGF - MI    Soc History: Married, 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gSurveyor, mining; tPharmacist, 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; nonsmoker; etoh -  light reg; exercise regularly    Colon screening - prior FIT    REVIEW OF SYSTEMS:neg other than subjective    OBJECTIVE:  BP (!) 149/89 (SITE: left arm, Orthostatic Position: sitting, Cuff Size: regular)   Pulse 97   Temp 36.7 C (98 F)   Resp 16   Wt 61.7 kg (136 lb)   LMP 04/07/2016   SpO2 100%   BMI 23.71 kg/m   GEN: NAD, Nondyspneic, Nonpallor, No juandice,  HEENT:  Head: NCAT  Thyroid: not enlarged, no nodules   No  Cervical or Supraclavicular LAD  No Carotid Bruits/JVD  Chest: no retractions, clear to ascultation; non dyspneic; no  fremitus  Cardiac:  RRR w/+1-2 murmur   Abdomen:  Inspection: nondistended,   Bowel sounds: + throughout   Palpation: soft, ?tenderness epigastric region;  No HSM, no hernias   Masses: none; no pulsatile mass   Murphy's sign; negative        ASSESSMENT AND PLAN: 30 minutes were spent with patient/family in  face to face, over  50%  of time spent counseling or coordinating care.  Patient counseled regarding consultations and recommendations of such for her cerivcal spine injury  Elevated BP - home measurements are normotensive - suspect mostly white coat HTN.  Once has lipids done can place into ASCVD calc   Anterior neck soreness /fullness- no thyroid goiter or nodule noted.  May be sequela of cervical surgery.  Thyroid function.  [place]   3. Murmur - echo scheduled in Feb   4. Ongoing epigastric pain with occasional need for carafate - h pylori stool antigen.  GGT and pancreatic enzymes ordered.  Will check [place] cholelithiasis     I did review patient's past medical and family/social history, no changes noted.    Barriers to Learning assessed: none. Patient verbalizes understanding  of teaching and instructions.    Electronically signed by  Grace Isaac. Kynnedi Zweig, [facility]  [address], [place] [address]  [phone]

## 2022-11-01 LAB — FECAL BY IMMUNOASSAY (FIT): FECAL IMMUNOCHEMICAL TEST: NEGATIVE

## 2022-11-02 ENCOUNTER — Encounter: Payer: Self-pay | Admitting: Family Medicine

## 2022-11-03 LAB — HELICOBACTER PYLORI AG, FECAL BY IMMUNOASSAY: Helicobacter Pylori Ag, Fecal: NEGATIVE

## 2022-11-10 ENCOUNTER — Telehealth: Payer: Self-pay | Admitting: Family Medicine

## 2022-11-10 ENCOUNTER — Ambulatory Visit: Payer: BC Managed Care – PPO | Admitting: Optometrist

## 2022-11-10 NOTE — Telephone Encounter (Signed)
Reviewed Mammogram Look back report.    Order for Mammogram is current in patient's chart.    I have attempted to contact this patient by phone with the following results: left message to return my call on answering machine.     MOSC:   If patient returns phone call, please give them information below to schedule their mammogram.    [place]:  [address]  [phone]    [facility]: [facility]  [address]  [phone]    [facility]: [place]  [address]  [phone]    [facility]:  [address]  [place] [address]  [phone]    Please document and close encounter.

## 2022-11-26 ENCOUNTER — Ambulatory Visit: Payer: No Typology Code available for payment source

## 2022-11-26 DIAGNOSIS — R011 Cardiac murmur, unspecified: Secondary | ICD-10-CM

## 2022-11-26 LAB — ECHOCARDIOGRAM COMPLETE
IVSD 2D: 0.9 cm (ref 0.6–0.9)
LEFT INTERNAL DIMENSION IN SYSTOLE: 2.7 cm
LEFT VENTRICULAR INTERNAL DIMENSION IN DIASTOLE: 5 cm (ref 3.8–5.2)
POSTERIOR WALL: 0.9 cm (ref 0.6–0.9)
TAPSE: 3.54 cm
TV PEAK SYSTOLIC PULMONARY ARTERY PRESSURE: 19.05 mmHg

## 2022-11-26 NOTE — Progress Notes (Signed)
Echocardiogram performed.     Janzen Sacks, Technician

## 2022-12-08 ENCOUNTER — Ambulatory Visit
Admission: RE | Admit: 2022-12-08 | Discharge: 2022-12-08 | Disposition: A | Discharge status: Home / Self Care | Payer: No Typology Code available for payment source | Source: Ambulatory Visit | Attending: Family Medicine | Admitting: Family Medicine

## 2022-12-08 ENCOUNTER — Ambulatory Visit (INDEPENDENT_AMBULATORY_CARE_PROVIDER_SITE_OTHER): Payer: No Typology Code available for payment source

## 2022-12-08 ENCOUNTER — Ambulatory Visit (INDEPENDENT_AMBULATORY_CARE_PROVIDER_SITE_OTHER)
Admission: RE | Admit: 2022-12-08 | Discharge: 2022-12-08 | Disposition: A | Discharge status: Home / Self Care | Payer: No Typology Code available for payment source | Source: Ambulatory Visit | Attending: Family Medicine | Admitting: Family Medicine

## 2022-12-08 DIAGNOSIS — R1013 Epigastric pain: Secondary | ICD-10-CM

## 2022-12-08 DIAGNOSIS — E041 Nontoxic single thyroid nodule: Secondary | ICD-10-CM

## 2022-12-08 DIAGNOSIS — Z0000 Encounter for general adult medical examination without abnormal findings: Secondary | ICD-10-CM | POA: Insufficient documentation

## 2022-12-08 DIAGNOSIS — R221 Localized swelling, mass and lump, neck: Secondary | ICD-10-CM

## 2022-12-08 LAB — GAMMA GLUTAMYL TRANSFERASE(GGT: Gamma Glutamyl Transferase(GGT): 14 U/L (ref 6–42)

## 2022-12-08 LAB — LIPID PANEL WITH DLDL REFLEX
Cholesterol: 225 mg/dL — ABNORMAL HIGH (ref <200)
HDL Cholesterol: 87 mg/dL (ref >40)
LDL Cholesterol Calculation: 129 mg/dL — ABNORMAL HIGH (ref <100)
Non-HDL Cholesterol: 138 mg/dL (ref <=150)
Total Cholesterol: HDL Ratio: 2.6 (ref <4.0)
Triglyceride: 43 mg/dL (ref <150)

## 2022-12-08 LAB — AMYLASE: Amylase: 45 U/L (ref 28–130)

## 2022-12-08 LAB — LIPASE: Lipase: 29 U/L (ref 13–60)

## 2022-12-08 NOTE — Progress Notes (Signed)
Please advise the ultrasound shows benign appearing nodules that do not need follow up.

## 2022-12-09 ENCOUNTER — Encounter: Payer: Self-pay | Admitting: Family Medicine

## 2023-02-18 ENCOUNTER — Telehealth: Payer: Self-pay | Admitting: Family Medicine

## 2023-02-18 NOTE — Telephone Encounter (Signed)
Results mailed to pts home address

## 2023-02-18 NOTE — Telephone Encounter (Signed)
General Advice / Message to MD:    Patient on 12/08/2022 had labs and thyroid ultrasound competed, she is requesting lab and imaging results sent to her home as they were not sent with the rest of results.    Gari Crown  Patient Services Rep II  Patient Contact Center

## 2023-03-11 ENCOUNTER — Ambulatory Visit: Payer: No Typology Code available for payment source | Admitting: Family Medicine

## 2023-03-27 ENCOUNTER — Encounter: Payer: Self-pay | Admitting: Family Medicine

## 2023-03-27 ENCOUNTER — Ambulatory Visit: Payer: No Typology Code available for payment source | Admitting: Family Medicine

## 2023-03-27 VITALS — BP 122/78 | HR 74 | Temp 97.6°F

## 2023-03-27 DIAGNOSIS — R2241 Localized swelling, mass and lump, right lower limb: Secondary | ICD-10-CM

## 2023-03-27 DIAGNOSIS — E041 Nontoxic single thyroid nodule: Secondary | ICD-10-CM

## 2023-03-27 NOTE — Nursing Note (Signed)
Pt declined vitals at start of appt.

## 2023-03-27 NOTE — Progress Notes (Signed)
ID/CC:Rebecca Warner is a 61yr old female here for follow up labs          S: patient has been noting mass on the distal right lateral leg.  Not tender  No ecchymosis or trauma   Has been wrapping with coban and helps.      Home BP still averaging about mid 120s/70s      Allergies:   Allergies   Allergen Reactions    Amoxicillin Hives    Pcn (Penicillin) [Penicillins] Rash    Peanuts [Peanut] Swelling    Seafood Swelling       Meds: Reviewed and updated in EPIC    PMH: PNA, Rosacea, severe cervical DJD, left ankle fracture Kaylyn Lim 10/2018    PSHx: laminectomy and laminoplasty at [facility] 07/2018, C/s x2 , uterine polyp resection    OB/GYN:  G3P2; SAB1; historically normal paps, last done 2014 with cotesting, last mammo 7/17 - normal; husband with vasectomy    FAm History:  M- thyroid, HTN; F-healthy ; Bx1 - healthy; MGM - colon CA 80s; PGF - MI    Soc History: Married, 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[place] ; Runner, 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; nonsmoker; etoh -  light reg; exercise regularly    Colon screening - prior FIT    REVIEW OF SYSTEMS:neg other than subjective    OBJECTIVE:  LMP 04/07/2016   The 10-year ASCVD risk score (Arnett DK, et al., 2019) is: 3.6%    Values used to calculate the score:      Age: 59 years      Sex: Female      Is Non-Hispanic African American: No      Diabetic: No      Tobacco smoker: No      Systolic Blood Pressure: 149 mmHg      Is BP treated: No      HDL Cholesterol: 87 mg/dL      Total Cholesterol: 225 mg/dL    GEN: NAD, Nondyspneic, Nonpallor, No juandice,  Right leg - distal lateral leg with approx 3cm x2cm non tender soft tissue mass like effect.        ASSESSMENT AND PLAN:   Right distal leg soft tissue mass - suspect lipoma - [place] ordered  Elevated BP - home measurements are normotensive - suspect mostly white coat HTN.   ASCVD calc is low   Anterior neck soreness /fullness- no thyroid goiter or nodule noted.  May be sequela of cervical surgery.  Thyroid function.  [place] ordered last visit - right TR3  nodule which does not warrant further evaluation or follow up.  We may consider [place] q 1-2 yrs for stablilty   3. Murmur - echo trace regurg.  Normal EF and no LVH   4. Ongoing epigastric pain with occasional need for carafate - h pylori stool antigen - neg and was as normal GGT and pancreatic enzyme.  Abd  [place] neg for cholelithiasis     I did review patient's past medical and family/social history, no changes noted.    Barriers to Learning assessed: none. Patient verbalizes understanding of teaching and instructions.    Electronically signed by  Newell Coral. Bindu Docter, D.O.  [facility]  [address]  [place], [place] [address]  [phone]

## 2023-03-31 ENCOUNTER — Telehealth: Payer: Self-pay | Admitting: Family Medicine

## 2023-03-31 ENCOUNTER — Ambulatory Visit
Admission: RE | Admit: 2023-03-31 | Discharge: 2023-03-31 | Disposition: A | Discharge status: Home / Self Care | Payer: No Typology Code available for payment source | Source: Ambulatory Visit | Attending: Family Medicine | Admitting: Family Medicine

## 2023-03-31 DIAGNOSIS — R2241 Localized swelling, mass and lump, right lower limb: Secondary | ICD-10-CM

## 2023-03-31 NOTE — Telephone Encounter (Signed)
Reviewed Mammogram Look back report.    Order for Mammogram is current in patient's chart.    I have attempted to contact this patient by phone with the following results: left message to return my call on answering machine.     MOSC:   If patient returns phone call, please give them information below to schedule their mammogram.    [place]:  [address]  [phone]    [facility]: [facility]  [address]  [phone]    [facility] [place]  [address]  [phone]    [facility]:  [address]  [place] [address]  [phone]    Please document and close encounter.

## 2023-04-02 ENCOUNTER — Telehealth: Payer: Self-pay | Admitting: Family Medicine

## 2023-04-02 NOTE — Telephone Encounter (Signed)
Called pt. No answer. Left voice message asking pt to return call.    PCC: please relay message as per Dr. Zena Amos.

## 2023-04-02 NOTE — Telephone Encounter (Signed)
Please call patient - x-ray as expected was normal  Please advise that she schedule her [place].

## 2023-06-10 ENCOUNTER — Telehealth: Payer: Self-pay | Admitting: Family Medicine

## 2023-06-10 NOTE — Telephone Encounter (Signed)
Referral Request:    Patient is calling to request new referral to medical dermatologist regarding a problem of actinic keratosis . She requests a referral her insurance is [employer]net.         Patient saw her private dermatologist (for rosacea) and was told she needs to be seen by a medical dermatologist because she has 2 actinic keratosis.    PH: [phone]     Lendell Caprice  Patient Services Representative II  [facility]  Patient Contact Center

## 2023-06-10 NOTE — Telephone Encounter (Signed)
Appt made with available physician for evaluation and referral. Pt notified.

## 2023-06-24 ENCOUNTER — Ambulatory Visit: Payer: No Typology Code available for payment source | Admitting: Internal Medicine

## 2023-06-24 VITALS — BP 158/97 | HR 74 | Temp 97.5°F | Ht 63.5 in | Wt 138.2 lb

## 2023-06-24 DIAGNOSIS — L570 Actinic keratosis: Secondary | ICD-10-CM

## 2023-06-24 NOTE — Nursing Note (Signed)
Patient identified by two identifies, name and date of birth. Vitals obtained, allergies verified and pharmacy updated.

## 2023-06-24 NOTE — Progress Notes (Signed)
ASSESSMENT & PLAN           Diagnoses and all orders for this visit:  Actinic keratoses  -     Dermatology Clinic Referral       Discussed likely actinic keratosis, these can potentially precancerous and develop into squamous cell carcinoma.  Recommend follow-up with medical dermatology and referral was placed today along with overall skin check         SUBJECTIVE      Rebecca Warner is a 78yr adult. She had concerns including Skin Problem.      Her cosmetic dermatologist noted are reddish spot on the right temporal area.  Felt it was actinic keratosis and recommend that she see her medical dermatologist along with some of the lesions on her face    OBJECTIVE      Her height is 1.613 m (5' 3.5") and weight is 62.7 kg (138 lb 3.7 oz). Her temporal temperature is 36.4 C (97.5 F). Her blood pressure is 158/97 (abnormal) and her pulse is 74. Her oxygen saturation is 99%.             General Appearance: healthy, alert, no distress, pleasant affect, cooperative.  Skin exam-noted is a pinkish erythematous macular lesion with no significant scaling on the right temporal area.  Mental Status: Appearance/Cooperation: in no apparent distress        Total time I spent in care of this patient today (excluding time spent on other billable services) was 15 minutes.

## 2023-06-24 NOTE — Patient Instructions (Signed)
HI Zeeva  Your referral was placed as requested .  A referral for further evaluation and treatment has been submitted  to the requested department :  [id number], [phone]. You should receive an approval notice by phone, mail or MyChart within 1-2 weeks. You may also contact the general phone number provided for the specialty clinic above if needed as well or call the Central Referral Unit at [phone] if any further issues or problems with your referral.             Burnett Corrente, MD

## 2023-07-01 ENCOUNTER — Telehealth: Payer: Self-pay | Admitting: Family Medicine

## 2023-07-01 NOTE — Telephone Encounter (Signed)
Reviewed Mammogram Look back report.    Order for Mammogram is current in patient's chart.    Telephone call to patient: Three patient identifiers used.    Mammogram not scheduled. Patient declined. Patient will call back and schedule at a later time.     Thank you  Kasandra Knudsen, MA

## 2023-07-31 ENCOUNTER — Other Ambulatory Visit: Payer: Self-pay | Admitting: Obstetrics and Gynecology

## 2023-07-31 DIAGNOSIS — Z1231 Encounter for screening mammogram for malignant neoplasm of breast: Secondary | ICD-10-CM

## 2023-10-09 ENCOUNTER — Ambulatory Visit
Admission: RE | Admit: 2023-10-09 | Discharge: 2023-10-09 | Disposition: A | Discharge status: Home / Self Care | Payer: BC Managed Care – PPO | Source: Ambulatory Visit | Attending: Obstetrics and Gynecology | Admitting: Obstetrics and Gynecology

## 2023-10-09 DIAGNOSIS — Z1231 Encounter for screening mammogram for malignant neoplasm of breast: Secondary | ICD-10-CM

## 2023-11-09 ENCOUNTER — Ambulatory Visit
Payer: No Typology Code available for payment source | Attending: Student in an Organized Health Care Education/Training Program | Admitting: Student in an Organized Health Care Education/Training Program

## 2023-11-09 ENCOUNTER — Encounter: Payer: Self-pay | Admitting: Student in an Organized Health Care Education/Training Program

## 2023-11-09 DIAGNOSIS — D2371 Other benign neoplasm of skin of right lower limb, including hip: Secondary | ICD-10-CM | POA: Insufficient documentation

## 2023-11-09 DIAGNOSIS — L570 Actinic keratosis: Secondary | ICD-10-CM | POA: Insufficient documentation

## 2023-11-09 DIAGNOSIS — D2271 Melanocytic nevi of right lower limb, including hip: Secondary | ICD-10-CM | POA: Insufficient documentation

## 2023-11-09 DIAGNOSIS — L578 Other skin changes due to chronic exposure to nonionizing radiation: Secondary | ICD-10-CM | POA: Insufficient documentation

## 2023-11-09 DIAGNOSIS — D2261 Melanocytic nevi of right upper limb, including shoulder: Secondary | ICD-10-CM | POA: Insufficient documentation

## 2023-11-09 DIAGNOSIS — D225 Melanocytic nevi of trunk: Secondary | ICD-10-CM | POA: Insufficient documentation

## 2023-11-09 DIAGNOSIS — L738 Other specified follicular disorders: Secondary | ICD-10-CM | POA: Insufficient documentation

## 2023-11-09 DIAGNOSIS — D2262 Melanocytic nevi of left upper limb, including shoulder: Secondary | ICD-10-CM | POA: Insufficient documentation

## 2023-11-09 DIAGNOSIS — L821 Other seborrheic keratosis: Secondary | ICD-10-CM | POA: Insufficient documentation

## 2023-11-09 NOTE — Patient Instructions (Addendum)
If a medication is ordered for you at your visit today, please be aware that your insurance plan may require a prior authorization to be completed prior to approving the medication to be dispensed. Prior authorizations may take 24-72 business hours to be processed. We recommend calling your pharmacy to determine if a prior authorization is needed prior to going to the pharmacy.  Please call the office with any questions.    I recommend mineral based, sensitive skin sunscreens with zinc oxide, such as [employer]ep[employer]-McMoRan Copper & Gold.      Sun protection information     Wynelle Link protection is essential for both skin cancer protection and defense against premature aging. This doesn't mean giving up enjoyment of the outdoors. But it does mean picking the combination of protective measures that is right for you (sun avoidance, sun-protective clothing, hats, sunscreens, etc.)     Picking the right sunscreen for you sometimes requires trial and error, so please be patient. I recommend going to a cosmetic store like [employer] to try out different sunscreen and see which one you works best for you.    When choosing a sunscreen, select one that is at least SPF30 and is labeled with the phrase "broad spectrum" with titanium dioxide or zinc oxide base.     Sunscreen: Make sure you wear sunscreen every day on the face.  On the body, apply sunscreen on all sun exposed areas when you are out in the sun.  I recommend sunscreen that has zinc oxide/ titanium oxide as the active ingredient and is SPF 30 or greater.  Some of my favorite facial sunscreens are:    - Company secretary super light SPF 30 (affordable option)  - Cerave AM lotion SPF 30 (affordable option)  - Oil of Olay Complete Daily Moisturizer with Sunscreen Broad Spectrum SPF 30 Sensitive (oil free)  - ColorScience Sunforgettable Total Protection Face Shield Flex SPF 50  - Biossance Squalane + Zinc sheer mineral sunscreen SPF 30  - Tatcha Silken Pore Perfecting sunscreen SPF 35  - Elta MD UV Daily  SPF 40  - Elta MD UV Clear SPF 46  - Alastin Hydratint Mineral Sunscreen SPF 36 (tinted)  - Ronnald Nian Max Mineral Naked Sunscreen SPF 45 (tinted)  - Supergoop! Zinc Screen 100% Mineral Lotion SPF 40 (tinted)  - Supergoop! Matte Screen SPF 40 (tinted)  - Paula's choice sunscreen (tinted)    Chemical facial sunscreen recommendation (less white cast/ feel less greasy)   - La Roche-Posay Double Repair Face Moisturizer UV SPF 30   - [place] Daily Defense Sunscreen SPF 36   - Beauty of Joseon Relief Sun SPF 50    If you want to avoid chemicals in sunscreens:  Some patients prefer to choose a sunscreen that utilizes physical blockers (that deflect or block energy from the sun) rather than chemical blockers (that absorb or scatter energy from the sun). Physical blockers are somewhat more difficult to rub in and may leave a white or purple sheen on the skin, but they are still very effective. Some physical blocking sunscreens include:   Blue Lizard's Sensitive or Baby lines   Neutrogena's Pure & Free Baby Faces line   La Roche-Posay Anthelios 50 Mineral Ultra   Vanicream Sunscreen (SPF 30 or SPF 60)   Aveeno's Baby Natural Protection line   Coppertone's Sensitive Skin line   EltaMD UV Pure or UV Physical   ArvinMeritor Everyday / Year-round     Body sunscreen- Some basic choices that  reduce both UVA and UVB exposure for outdoor activities:   Coppertone's Sport and Coca Cola   La Roche-Posay's Anthelios 40 Sunscreen with Mexoryl   Aveeno's Protect & Hydrate line   Blue Lizard's Regular line   Ocean Potion (SPF 30 or 50)    What about sun protection for water activities or sweaty activities?  A wetsuit or UV-proof water [place] (aka rash guard, like what surfers or lifeguards wear) is recommended for water sports. Some available brands including Coolibar, Solbari and other brands that can be found on [employer] or local stores. Other sun-exposed areas require special sunscreens that are labelled as  "water resistant" for 80 minutes. These need to be reapplied often. Some examples of water resistant sunscreens include:   Neutrogena's Gannett Co, Jabil Circuit, or Ultimate Sport lines   Coppertone's Sport, Ultraguard, and Principal Financial 'n Clear lines   Aveeno's Protect & Hydrate line   Blue Lizard's Regular or Sports lines    If you are photosensitive (extremely sun-sensitive or allergic to the sun)  Sun-protective clothing (including hats & gloves) and sun-avoidance between 10am-2pm is essential. For exposed areas, we recommend these sunscreens:   Anthelios SPF60    Vanicream SPF60    Note: Sunscreen manufacturers may change their products or ingredients from time to time, and the above list therefore could contain information that has since changed.

## 2023-11-09 NOTE — Progress Notes (Signed)
Visit Date: 11/09/2023   Last Seen: 03/18/2017 at [facility]    First visit for Rebecca Warner (DOB: 12-Aug-1961), a 63yr old adult.    Chief complaint: Skin check.  History obtained from: Patient.   Skin cancer history: No personal history of skin cancer.  Family history of skin cancer: SCC.     Dx: Diffuse actinic damage   HPI: Scaling lesion(s) on the face for several months to years. Associates flaking of the skin. Waxes and wanes. There has been no bleeding or constant pain. No recent treatment. However, in the past has treated with LN2. Denies any hx of cold sores.  PE: Scaling, pink rough-to-touch papule(s) and patch(es) of the face.   Assessment: Flaring  Plan:   - Treatment options such as liquid nitrogen, PhotoDynamic Treatment, and no treatment were discussed. Given the diffuse nature of actinic keratoses in combination with patient preferences we recommended field treatment with PDT.   - Referral placed for PDT.     Dx: Dermatofibroma  HPI: Skin growth located on the right leg x many months. Asymptomatic. No prior treatment or biopsy. No known modifiers.  PE: Firm brown dimpling papule on the right posterior calf.  Assessment: Chronic, benign  Plan:   - Benign nature of these skin lesions was explained to the patient. No treatment is indicated at this time. The patient has been educated about self examination, and if changes occur including change in size,color or if lesions become symptomatic, the patient was instructed to return for follow up.     Dx: Sebaceous hyperplasia  HPI: Skin growths located on the face, first noticed several years ago. Asymptomatic, not growing or changing. No prior treatment or biopsy. No known modifiers.  PE: 1-2 mm yellowish papules on the forehead and bilateral cheeks with central dell.   Assessment: Chronic, benign  Plan:   - I reassured the patient that these are benign and there were no suspicious features.  No intervention is needed, unless symptomatic.      Dx: Seborrheic  Keratoses  HPI: Patient notes brown bumps on the back. Present for many months. Asymptomatic, no previous treatment   PE: Multiple brown, verrucous, stuck-on papules on the trunk.   Plan: Benign nature of these skin lesions was explained to the patient. No treatment is indicated at this time.     Dx: Benign appearing nevi  HPI: Reports moles on the trunk and extremities, present for many years.  No painful, bleeding, irritated or changing moles.  No prior treatment.     PE: Manson Passey, regular appearing macules and papules on the trunk and extremities with regular appearing pigment network on dermoscopy.   Plan:   - Discussed that nevi appear benign on today's exam.  Discussed that concerning changes may include developing bumps or nodules, bleeding, itching, pain or changes in the shape and color; advised follow up in clinic with any changes.  - Signs of melanoma and concerning symptoms reviewed; recommend regular self skin checks and follow up quickly PRN concerning changes.  - Importance of sunscreen/protective sun clothing and sun avoidance reviewed.    Return Visit: 1 year or sooner PRN    Problem List:   Patient Active Problem List    Diagnosis Date Noted    Allergic conjunctivitis 08/27/2017    Lateral epicondylitis of right elbow 01/16/2017    Trochanteric bursitis of right hip 01/16/2017    Dermatochalasis of eyelids of both eyes 05/07/2016     Added automatically from request for  surgery [id number]      Facial aging 05/04/2015    Dermatochalasis of both eyelids 02/13/2015    Brow ptosis 02/13/2015    Allergy to mites 04/06/2014    Allergic rhinitis due to allergen 04/05/2014    Acute atopic conjunctivitis 04/05/2014    Asthma, cough variant 04/05/2014    Cervical radicular pain 04/28/2013    Rosacea 12/31/2012    Low vitamin B12 level 12/31/2012    Degenerative cervical disc 12/31/2012      Meds:  List in EMR viewed today    Allergies:    List in EMR viewed  today    Labs Reviewed:  -    Areas Examined:  full body, excluding groin    Patient was made aware of the [facility] chaperone policy.   Patient declined chaperone.    SCRIBE DISCLAIMER:   This note was scribed by Lawerance Cruel, SCRIBE, a trained medical scribe in the presence of Lonie Peak, MD.  Electronically signed by Lawerance Cruel, SCRIBE, 11/09/2023 11:32       I have reviewed the note and agree with it as written. I was physically present for the exam and personally interviewed the patient.     Lonie Peak, MD, FAAD  [facility]

## 2023-11-09 NOTE — Nursing Note (Signed)
Identified patient by name and DOB, screened for pain, mobility assessment, reviewed allergies and verified pharmacy.   Vitals not taken per provider's request.     Has patient fallen in the last 6 months:no  Does the patient used assistive devices such as a walker, wheelchair, or cane: no     I verified with patient;  It is okay to leave a detailed message regarding confidential medical information on phone number [phone] ).  And to be  notified via MyChart as well.     I notified the patient that if the provider will be performing an exam under their undergarments, a chaperone will be offered during that exam.  The patient has declined  a chaperone.  The provider was notified.     Latoshia Monrroy, LVN

## 2024-03-02 ENCOUNTER — Other Ambulatory Visit: Payer: Self-pay | Admitting: Family Medicine

## 2024-03-02 DIAGNOSIS — R030 Elevated blood-pressure reading, without diagnosis of hypertension: Secondary | ICD-10-CM

## 2024-03-03 NOTE — Telephone Encounter (Signed)
 Pharmacy Refill Optimization (PRO)    Refill authorized per PRO CPA 690-00 03/03/2024    Lab(s) overdue, but within range. Transition fill and overdue lab ordered per protocol. Lab compliance letter has been issued.   Future refills will require provider authorization if patient fails to draw overdue lab.  Encounter will be routed to provider if additional labs are necessary.     See Protocol Details for additional information   ====================================================================    Medication name:   Requested Prescriptions     Pending Prescriptions Disp Refills    Spironolactone  (ALDACTONE ) 25 mg Tablet [Pharmacy Med Name: SPIRONOLACTONE  25 MG TABLET] 90 tablet 1     Sig: Take 1 tablet by mouth every day.     Labs (if required by protocol):   Lab Results   Component Value Date    NA 141 10/21/2022    NA 139 07/05/2018    K 4.5 10/21/2022    K 4.1 07/05/2018    CA 9.6 10/21/2022    CA 9.2 07/05/2018    CO2 25 10/21/2022    CO2 26 07/05/2018    CR 0.71 10/21/2022    CR 0.71 07/05/2018    GFR 98 10/21/2022    GFR 96 07/05/2018    GFR >100 07/05/2018    BUN 9 10/21/2022    BUN 8 07/05/2018

## 2024-03-23 ENCOUNTER — Telehealth: Payer: Self-pay | Admitting: Family Medicine

## 2024-03-23 NOTE — Telephone Encounter (Signed)
 Pt is eager to update her Tdap and any other vaccines she may need. She is aware that Tdap is not due but is concerned that scheduled routine vaccines will no longer be available due to the re branding of CDC and other government health agencies. Offered appt (since she is due for WWE) but she declined at this time. She requested to see Dr. Wilmon Hashimoto or have message sent to Dr. Wilmon Hashimoto. I then informed pt that of Dr. Jairo Mayer pending retirement. Pt stated that she would like to hold off on appt until she establishes with new PCP. She the requested to establish with Dr. Esaw Heckler. I informed patient that our leadership is reviewed provider panels to determine who is able to absorb the patients. Informed her that this may not be possible. She would like message sent to leadership anyhow.

## 2024-03-23 NOTE — Telephone Encounter (Signed)
 Patient requesting if she can receive her DTAP early.    Also, inquiring if provider recommends if there are any boosters she is needing at this time or within the next 3 years. Patient states she is concerned with vaccines being stopped.    Preferred: [phone]  Okay to leave detailed message.    Rebecca Warner  Patient Services Rep II  Patient Contact Center

## 2024-04-01 ENCOUNTER — Ambulatory Visit

## 2024-04-01 DIAGNOSIS — Z23 Encounter for immunization: Secondary | ICD-10-CM

## 2024-04-01 NOTE — Progress Notes (Signed)
COVID-19 Vaccine Documentation:  The patient is eligible to receive the COVID-19 Vaccine at this time:Yes    COVID-19 Vaccine VIS given to the patient/caregiver for review. All patient/caregiver questions were answered and the patient/caregiver consents to the COVID-19 vaccine being given today.      Vaccine prepared and administered according to the Prescribing Information.       COVID-19 Vaccine Observation:  The patient was observed for 0 minutes for immediate reactions to the vaccine per protocol. No reaction was observed.    Luellen Pucker, RN

## 2024-07-14 ENCOUNTER — Other Ambulatory Visit: Payer: Self-pay | Admitting: Obstetrics and Gynecology

## 2024-07-14 DIAGNOSIS — Z1231 Encounter for screening mammogram for malignant neoplasm of breast: Secondary | ICD-10-CM

## 2024-09-12 ENCOUNTER — Ambulatory Visit: Admitting: INTERNAL MEDICINE

## 2024-09-12 VITALS — BP 168/82 | Temp 97.7°F | Wt 136.5 lb

## 2024-09-12 DIAGNOSIS — L309 Dermatitis, unspecified: Secondary | ICD-10-CM

## 2024-09-12 DIAGNOSIS — R21 Rash and other nonspecific skin eruption: Secondary | ICD-10-CM

## 2024-09-12 MED ORDER — TRIAMCINOLONE ACETONIDE 0.5 % TOPICAL CREAM: TOPICAL_CREAM | Freq: Two times a day (BID) | TOPICAL | 1 refills | Status: AC

## 2024-09-12 NOTE — Progress Notes (Signed)
 ASSESSMENT & PLAN        Assessment & Plan     Dermatitis  Most consistent with dermatitis;   Low suspicion for atopy/eczema or psoriasis.   - skin scraping per patient request  -Trial steroid cream; 2 weeks;  if not resolved, may increase potency.          SUBJECTIVE      Rebecca Warner is a 63yr old adult.        Mid August  Lower back; right    Reports allergies; foods and pollens    Occasional rashes since 2008 with resolutions    Last 2-3 weeks reports spreading to abdomen     Reports new underwear with with different elastic     Trailed h blocker  Low potenency hydrocortisone   Neosporin        History of Present Illness         OBJECTIVE      Her vitals were not taken for this visit.       Physical Exam  Vitals reviewed.   Constitutional:       General: She is not in acute distress.     Appearance: Normal appearance. She is not ill-appearing, toxic-appearing or diaphoretic.   HENT:      Head: Normocephalic and atraumatic.   Skin:          Comments: Erythemas patch    Neurological:      General: No focal deficit present.      Mental Status: She is alert and oriented to person, place, and time.   Psychiatric:         Mood and Affect: Mood normal.         Behavior: Behavior normal.         Thought Content: Thought content normal.         Judgment: Judgment normal.         Results           I obtained verbal consent from the patient to use AI ambient technology to transcribe the interactions between the patient and myself during the clinical encounter.    Electronically signed by:  Maranda Starks, MD  Internal Medicine

## 2024-09-12 NOTE — Nursing Note (Signed)
 Vitals, pharmacy, pain and allergies verified. Osborne Oman, LVN

## 2024-09-20 ENCOUNTER — Telehealth: Payer: Self-pay | Admitting: INTERNAL MEDICINE

## 2024-09-20 ENCOUNTER — Telehealth: Payer: Self-pay | Admitting: Family Medicine

## 2024-09-20 NOTE — Telephone Encounter (Signed)
 Called patient back. Used 2 patient identifiers.     Informed patient that lab was unable to process a specimen and instructed patient to come back to the office to be have culture swab repeated by Dr. Landy. Patient verbalized understanding and asked if she could come Thursday (12/11) afternoon.     Closing encounter.    Catarina Canny LVN

## 2024-09-20 NOTE — Telephone Encounter (Signed)
 Spoke with patient (see encounter Dr. Landy 12/09).    Identified X 2 patient identifiers.    Patient asked me to give Dr. Dierdre a message.    I will be traveling internationally the week after my physical with you in February. I would like you to please order lab work that I can have completed prior to my appointment so we can discuss the results. Normally I think CVC and Chemistry lipid panels are checked. I was given spironolactone  by Dr. Alfreida and I would like my Potassium levels to be checked too please. My BP was high last time I was there because I have white coat syndrome, but I wanted to report my readings at home have been 120s/80s.    Please advise patient request. I informed patient that we would let her know if orders were placed.    Catarina Canny LVN

## 2024-09-20 NOTE — Telephone Encounter (Signed)
 Patient came in requesting lab tests prior to Physical (with Dr. Dierdre 11/16/24). She said she wants all of the tests that are normally ordered with Physicals, but also requested a test of potassium levels (mentioned prior PCP would order for her).     The reason for the request is that she would be leaving the county shortly after Physical and she wanted to be able address issues/abnormalities with tests before that time.    If any issues with request please call at primary phone number (patient does not have My Chart).    Thank You,  Rebecca Warner  PSR II

## 2024-09-20 NOTE — Telephone Encounter (Signed)
 Patient came in to inquire about lab test (Culture, Fungus, Skin, Hair or Nails)  she had completed on 12/1. Says she had not gotten any follow up regarding results. We let patient know that the results had not yet been received. Patient mentioned that she was prescribed triamcinolone  (Kenalog ) 0.5 % cream During appointment on 12/1. Said she would not be taking this medication until she gets results and follow up instructions.     Patient is asking for a phone call at primary number when results come in.    Thank You,  Redell Dowdy  PSR II

## 2024-09-22 ENCOUNTER — Other Ambulatory Visit: Payer: Self-pay | Admitting: Student in an Organized Health Care Education/Training Program

## 2024-09-22 DIAGNOSIS — L570 Actinic keratosis: Secondary | ICD-10-CM

## 2024-09-22 MED ORDER — AMINOLEVULINIC ACID HCL 20 % TOPICAL SOLUTION
1.0000 | Freq: Once | TOPICAL | Status: AC
  Administered 2024-09-26: 1 via TOPICAL

## 2024-09-22 NOTE — Telephone Encounter (Signed)
 I have never seen this patient.     Ordering appropriate labs involves reviewing chart & takes time.     So I am not.     Electronically signed by:  Darice CROME. Helana Macbride, MD  [facility]  [facility] [place]

## 2024-09-22 NOTE — Telephone Encounter (Signed)
 Left message to call. OPERATOR: if patient calls back please relay PCP message.  Thank you,  Vivaan Helseth MA1

## 2024-09-26 ENCOUNTER — Ambulatory Visit: Attending: Student in an Organized Health Care Education/Training Program

## 2024-09-26 DIAGNOSIS — L570 Actinic keratosis: Secondary | ICD-10-CM | POA: Insufficient documentation

## 2024-09-26 NOTE — Nursing Note (Signed)
 Attending Physician:Rick , M.D.      Treatment Location  face    PhotoSensitizer: Aminolevulinic acid HCI.   Lot#: [id number]   Exp. Date: 5/29 Sticks used1  Incubation time: 60  min.  Light Source: BluU   Parameters; Exposure time: 16  min.  Anesthetic: None   Complications: None  Indication: Actinic Keratosis   Preoperative Diagnosis: Actinic Keratosis   Postoperative Diagnosis: Actinic Keratosis    Preoperative Meds: None  Postoperative MedsNone  Condition at discharge:  good      The patient was made aware of the following risks:  scarring, infection, burns, pain, pigmentation changes, and failure to resolve condition prior to the procedure.     The patient 's treatment site was cleansed with alcohol.  The levulan was then applied to the treatment location with care taken to avoid exposure of the drug to the patient's eyes.   The patient was incubated with the medications for the time specified above.    The patient was then given protective goggles to wear.  They were exposed to the light source and parameters specified above.  Prior to discharge, sunscreen was applied to patient .      Prior to discharge, the patient was given a detailed post- treatment instruction sheet. Sunscreen was applied to patient and the patient was left wearing appropriate clothing.   Rebecca Warner  Sr.LVN  [phone]

## 2024-09-26 NOTE — Nursing Note (Signed)
 Patient is here for PDT treatment  on the face. Follow up visit 1/27/2025Last PDT treatment none discussed the procedure in detail, patient stated understanding.Verified if patient experiences frequent cold sores. All questions were answered. Orders were verified and consent has been signed.  I applied Levulan to face per Dr.  Dick  order# [id number] .Pt tolerated well. Lot # [id number] Expires 02/2028    1sticks Levulan used.   Mahaila Tischer, LVN

## 2024-09-26 NOTE — Patient Instructions (Signed)
 POST PDT INSTRUCTIONS     It is important that you avoid the sun and intense light and heat for 48 hours following the procedure.  It will take 48 hours for the medication to be metabolized. .    Exposure to sunlight while the Levulan is still in your skin could result in severe burns   .      After your treatment  Stay indoors for 48 hours away from windows and skylights.     Photosensitivity will last for 48 hours   Take warm (not hot) showers  Do not expose treated areas to any form of intense light or heat   You can expect your treatment area to turn red, similar to a sunburn reaction.  Some people experience some swelling and tenderness for the first few days.   Gentle cooling may be helpful to sooth swelling and inflammation. Fill a resealable bag with ice and water, and place it over the area for up to 5 minutes every half hour as tolerated. Swelling will be most evident around eyes and usually more prominent in the morning.   To reduce any swelling use two pillows for your head.  For additional soothing, cool vinegar soaks may be applied.  Mix 1 tbsp white vinegar with 8 ounces of ice water.  Apply with cool washcloth for 5-10 minutes every 2-4 hours as needed. Then apply petroleum jelly.  Petroleum Jelly is helpful in relieving dryness and cracking at least twice a day   Do not use aloe vera lotion or gel, lidocaine, antibiotic ointment, or medicated topicals.  Some peeling will occur 3-7 days following PDT     Post Treatment Sun Protection  Sunscreens: For everyday use, SPF 30 with Zinc are recommended. Make sure to protect your lips as well. Our dermatologists suggest water-resistant, SPF 30 or greater, with at least 10% Zinc oxide for any outdoor activity longer than 30 minutes in addition to hats, gloves, and other sun protective clothing (Activities outdoors should be avoided until 48 hours after treatment).   If you have problems or questions, please call the clinic at [phone]) during office  hours (Monday- Friday, 8am-5pm). If after hours, please call [phone]) and ask for the Dermatology Resident on Call.

## 2024-10-10 ENCOUNTER — Ambulatory Visit
Admission: RE | Admit: 2024-10-10 | Discharge: 2024-10-10 | Disposition: A | Source: Ambulatory Visit | Attending: Obstetrics and Gynecology | Admitting: Obstetrics and Gynecology

## 2024-10-10 DIAGNOSIS — Z1231 Encounter for screening mammogram for malignant neoplasm of breast: Secondary | ICD-10-CM

## 2024-10-23 NOTE — Progress Notes (Unsigned)
 Visit Date: 10/24/2024  Last seen: 11/09/2023 by me    Follow-up visit for CAMBREE Rebecca Warner (DOB: December 12, 1960), a 64yr old adult.    Chief complaint: *** Skin check.  History obtained from: Patient.   Skin cancer history: No personal history of skin cancer.  Family history of skin cancer: SCC.     Dx: Diffuse actinic damage   HPI: Scaling lesion(s) on the face for several months to years. Associates flaking of the skin. Waxes and wanes. There has been no bleeding or constant pain. No recent treatment. However, in the past has treated with LN2. Denies any hx of cold sores.  Interval 10/24/2024: Pt s/p PDT to face w/ 60 min exposure time on 09/26/2024 ***  PE: *** Scaling, pink rough-to-touch papule(s) and patch(es) of the face.   Assessment: *** Flaring  Plan: ***  - Treatment options such as liquid nitrogen, PhotoDynamic Treatment, and no treatment were discussed. Given the diffuse nature of actinic keratoses in combination with patient preferences we recommended field treatment with PDT.   - Referral placed for PDT.     Dx: Dermatofibroma  HPI: Skin growth located on the right leg x many months. Asymptomatic. No prior treatment or biopsy. No known modifiers.  PE: Firm brown dimpling papule on the right posterior calf.  Assessment: Chronic, benign  Plan:   - Benign nature of these skin lesions was explained to the patient. No treatment is indicated at this time. The patient has been educated about self examination, and if changes occur including change in size,color or if lesions become symptomatic, the patient was instructed to return for follow up.     Dx: Sebaceous hyperplasia  HPI: Skin growths located on the face, first noticed several years ago. Asymptomatic, not growing or changing. No prior treatment or biopsy. No known modifiers.  PE: 1-2 mm yellowish papules on the forehead and bilateral cheeks with central dell.   Assessment: Chronic, benign  Plan:   - I reassured the patient that these are benign and there were  no suspicious features.  No intervention is needed, unless symptomatic.      Dx: Seborrheic Keratoses  HPI: Patient notes brown bumps on the back. Present for many months. Asymptomatic, no previous treatment   PE: Multiple brown, verrucous, stuck-on papules on the trunk.   Plan: Benign nature of these skin lesions was explained to the patient. No treatment is indicated at this time.     Dx: Benign appearing nevi  HPI: Reports moles on the trunk and extremities, present for many years.  No painful, bleeding, irritated or changing moles.  No prior treatment.     PE: Delores, regular appearing macules and papules on the trunk and extremities with regular appearing pigment network on dermoscopy.   Plan:   - Discussed that nevi appear benign on today's exam.  Discussed that concerning changes may include developing bumps or nodules, bleeding, itching, pain or changes in the shape and color; advised follow up in clinic with any changes.  - Signs of melanoma and concerning symptoms reviewed; recommend regular self skin checks and follow up quickly PRN concerning changes.  - Importance of sunscreen/protective sun clothing and sun avoidance reviewed.    Return Visit: *** 1 year or sooner PRN    Problem List:   Patient Active Problem List    Diagnosis Date Noted    Allergic conjunctivitis 08/27/2017    Lateral epicondylitis of right elbow 01/16/2017    Trochanteric bursitis of right hip 01/16/2017  Dermatochalasis of eyelids of both eyes 05/07/2016     Added automatically from request for surgery [id number]      Facial aging 05/04/2015    Dermatochalasis of both eyelids 02/13/2015    Brow ptosis 02/13/2015    Allergy  to mites 04/06/2014    Allergic rhinitis due to allergen 04/05/2014    Acute atopic conjunctivitis 04/05/2014    Asthma, cough variant 04/05/2014    Cervical radicular pain 04/28/2013    Rosacea 12/31/2012    Low vitamin B12 level 12/31/2012    Degenerative cervical  disc 12/31/2012      Meds:  List in EMR viewed today    Allergies:    List in EMR viewed today    Labs Reviewed:  -    Patient was made aware of the [facility] chaperone policy.  view chaperone policy:[id number]   Chaperone Options (Optional):38932    Areas Examined:  full body, excluding groin    SCRIBE DISCLAIMER:   ***    ***

## 2024-10-24 ENCOUNTER — Ambulatory Visit: Admitting: Student in an Organized Health Care Education/Training Program

## 2024-10-24 ENCOUNTER — Encounter: Payer: Self-pay | Admitting: Student in an Organized Health Care Education/Training Program

## 2024-10-24 DIAGNOSIS — L570 Actinic keratosis: Secondary | ICD-10-CM

## 2024-10-24 NOTE — Progress Notes (Signed)
 Visit Date: 10/24/2024  Last seen: 11/09/2023 by me    Follow-up visit for Rebecca Warner (DOB: 1961/04/09), a 64yr old adult.    Chief complaint: Follow up from PDT treatment on 09/26/2024  History obtained from: Patient.   Skin cancer history: No personal history of skin cancer.  Family history of skin cancer: SCC.     Dx: Diffuse actinic damage   HPI: Scaling lesion(s) on the face for several months to years. Associates flaking of the skin. Waxes and wanes. There has been no bleeding or constant pain. No recent treatment. However, in the past has treated with LN2. Denies any hx of cold sores. Patient states she does not think the PDT has helped as much.   Interval 10/24/2024: Pt s/p PDT to face w/ 60 min exposure time on 09/26/2024.   PE: Scaling, pink rough-to-touch papule(s) and patch(es) of the face.   Assessment: Flaring  Plan:   - Natural history reviewed including small risk of conversion to squamous cell carcinoma over time.  - Importance of self-skin exam and photoprotection reviewed including wearing broad spectrum sunscreen or sunblock, physical barrier protection such as wide brimmed hats and long sleeved shirts, and avoiding peak sun exposure from 10am to 4pm.  - Discussed treatment option with LN2 vs treating with field therapy, such as PDT and Efudex cream. Patient amenable to doing the LN2 at the next full body visit.    Dx: Atopic Dermatitis  HPI: Pt presents with rash on the abdomen and back present for many months. Associates itch. Patient has been using triamcinolone  given by PCP.  PE:  Ill defined erythematous thin plaques and papules with overlying fine white scale on  Plan:    - Discussed diagnosis and treatment options, including topical steroids.   - Avoid certain allergens and triggers  - Advised stress reduction   - Continue Triamcinolone  0.5% cream. Apply to affected areas on the abdomen and back twice daily as needed. Don't use on face/groin. Okay to use up to 21 days of the month. Stop  when rash is gone. Restart as needed.  - Reviewed dry skin care including short, lukewarm baths/showers (5-10 min), with a mild soap such as unscented Dove, patting dry, followed with generous application of mild, hypoallergenic emollients in cream form (such as Aveeno, Cetaphil, Eucerin or Aquaphor) within 5 minutes of bathing and several times throughout the day as tolerated.  Avoid dryer sheets or fabric softeners.  Use ALL Free & Clear detergent. Dry skin care sheet given to patient and discussed.    Return Visit: PRN and next month for FBE    Problem List:   Patient Active Problem List    Diagnosis Date Noted    Allergic conjunctivitis 08/27/2017    Lateral epicondylitis of right elbow 01/16/2017    Trochanteric bursitis of right hip 01/16/2017    Dermatochalasis of eyelids of both eyes 05/07/2016     Added automatically from request for surgery [id number]      Facial aging 05/04/2015    Dermatochalasis of both eyelids 02/13/2015    Brow ptosis 02/13/2015    Allergy  to mites 04/06/2014    Allergic rhinitis due to allergen 04/05/2014    Acute atopic conjunctivitis 04/05/2014    Asthma, cough variant 04/05/2014    Cervical radicular pain 04/28/2013    Rosacea 12/31/2012    Low vitamin B12 level 12/31/2012    Degenerative cervical disc 12/31/2012      Meds:  List in EMR viewed today  Allergies:    List in EMR viewed today    Labs Reviewed:  -    Patient was made aware of the [facility] chaperone policy.       Areas Examined:  full body, excluding groin    SCRIBE DISCLAIMER:         I have reviewed the note and agree with it as written. I was physically present for the exam and personally interviewed the patient.     Dorn Hurl, MD, FAAD  [facility]

## 2024-10-24 NOTE — Progress Notes (Deleted)
 Visit Date: 10/24/2024  Last seen: 11/09/2023 by me    Follow-up visit for Rebecca Warner (DOB: 30-Sep-1961), a 64yr old adult.    Chief complaint:  Skin check.  History obtained from: Patient.   Skin cancer history: No personal history of skin cancer.  Family history of skin cancer: SCC.     Dx: Diffuse actinic damage   HPI: Scaling lesion(s) on the face for several months to years. Associates flaking of the skin. Waxes and wanes. There has been no bleeding or constant pain. No recent treatment. However, in the past has treated with LN2. Denies any hx of cold sores.  Interval 10/24/2024: Pt s/p PDT to face w/ 60 min exposure time on 09/26/2024 ***  PE: *** Scaling, pink rough-to-touch papule(s) and patch(es) of the face.   Assessment: *** Flaring  Plan: ***  - Treatment options such as liquid nitrogen, PhotoDynamic Treatment, and no treatment were discussed. Given the diffuse nature of actinic keratoses in combination with patient preferences we recommended field treatment with PDT.   - Referral placed for PDT.     Dx: Dermatofibroma  HPI: Skin growth located on the right leg x many months. Asymptomatic. No prior treatment or biopsy. No known modifiers.  PE: Firm brown dimpling papule on the right posterior calf.  Assessment: Chronic, benign  Plan:   - Benign nature of these skin lesions was explained to the patient. No treatment is indicated at this time. The patient has been educated about self examination, and if changes occur including change in size,color or if lesions become symptomatic, the patient was instructed to return for follow up.     Dx: Sebaceous hyperplasia  HPI: Skin growths located on the face, first noticed several years ago. Asymptomatic, not growing or changing. No prior treatment or biopsy. No known modifiers.  PE: 1-2 mm yellowish papules on the forehead and bilateral cheeks with central dell.   Assessment: Chronic, benign  Plan:   - I reassured the patient that these are benign and there were no  suspicious features.  No intervention is needed, unless symptomatic.      Dx: Seborrheic Keratoses  HPI: Patient notes brown bumps on the back. Present for many months. Asymptomatic, no previous treatment   PE: Multiple brown, verrucous, stuck-on papules on the trunk.   Plan: Benign nature of these skin lesions was explained to the patient. No treatment is indicated at this time.     Dx: Benign appearing nevi  HPI: Reports moles on the trunk and extremities, present for many years.  No painful, bleeding, irritated or changing moles.  No prior treatment.     PE: Rebecca Warner, regular appearing macules and papules on the trunk and extremities with regular appearing pigment network on dermoscopy.   Plan:   - Discussed that nevi appear benign on today's exam.  Discussed that concerning changes may include developing bumps or nodules, bleeding, itching, pain or changes in the shape and color; advised follow up in clinic with any changes.  - Signs of melanoma and concerning symptoms reviewed; recommend regular self skin checks and follow up quickly PRN concerning changes.  - Importance of sunscreen/protective sun clothing and sun avoidance reviewed.    Return Visit: 1 year or sooner PRN    Problem List:   Patient Active Problem List    Diagnosis Date Noted    Allergic conjunctivitis 08/27/2017    Lateral epicondylitis of right elbow 01/16/2017    Trochanteric bursitis of right hip 01/16/2017  Dermatochalasis of eyelids of both eyes 05/07/2016     Added automatically from request for surgery [id number]      Facial aging 05/04/2015    Dermatochalasis of both eyelids 02/13/2015    Brow ptosis 02/13/2015    Allergy  to mites 04/06/2014    Allergic rhinitis due to allergen 04/05/2014    Acute atopic conjunctivitis 04/05/2014    Asthma, cough variant 04/05/2014    Cervical radicular pain 04/28/2013    Rosacea 12/31/2012    Low vitamin B12 level 12/31/2012    Degenerative cervical disc  12/31/2012      Meds:  List in EMR viewed today    Allergies:    List in EMR viewed today    Labs Reviewed:  -    Patient was made aware of the [facility] chaperone policy.  view chaperone policy:[id number]  Chaperone declined by patient.    Areas Examined:  full body, excluding groin    Jenna Dec, MD  Dermatology Resident, PGY2  Patient was seen and discussed with my attending, Dr. Dick

## 2024-10-24 NOTE — Nursing Note (Signed)
 Patient identified x 2 Rebecca Warner [birth year]) Vitals taken, allergies verified, and patient has been screened for pain.      Rebecca Warner  [place], [place]

## 2024-11-16 ENCOUNTER — Ambulatory Visit: Payer: Self-pay | Admitting: Family Medicine

## 2024-11-16 VITALS — BP 154/90 | Temp 98.7°F | Ht 63.0 in | Wt 136.2 lb

## 2024-11-16 DIAGNOSIS — R7989 Other specified abnormal findings of blood chemistry: Secondary | ICD-10-CM

## 2024-11-16 DIAGNOSIS — R030 Elevated blood-pressure reading, without diagnosis of hypertension: Secondary | ICD-10-CM

## 2024-11-16 DIAGNOSIS — Z1231 Encounter for screening mammogram for malignant neoplasm of breast: Secondary | ICD-10-CM

## 2024-11-16 DIAGNOSIS — E559 Vitamin D deficiency, unspecified: Secondary | ICD-10-CM

## 2024-11-16 DIAGNOSIS — Z23 Encounter for immunization: Secondary | ICD-10-CM

## 2024-11-16 NOTE — Progress Notes (Unsigned)
 ASSESSMENT & PLAN       TIP  Review and verify diagnoses imported via AI Scribe are correct, matches what is in the Epic Visit Dx, and does not contradict other information in the note:15640  Assessment & Plan  Elevated blood pressure  Labile blood pressure with white coat syndrome. Home readings are generally in the high 120s to low 130s systolic and 78-85 diastolic. Prefers non-digital management due to privacy concerns. Currently uses spironolactone  as needed for elevated blood pressure, especially during high-stress situations or high-salt meals. Avoids ACE inhibitors and calcium channel blockers due to potential side effects and current heart rate.  - Continue spironolactone  as needed for elevated blood pressure.  - Avoid ACE inhibitors and calcium channel blockers.  - Consider ARBs like losartan for blood pressure management.  - Monitor blood pressure at home and record readings for review.  - Ordered blood work including automotive engineer and CBC to monitor potassium and other parameters.    Trochanteric bursitis, right hip  Right hip pain, particularly when sitting still or getting up after prolonged sitting. Pain is not associated with movement, suggesting possible trochanteric bursitis or gluteal tendinitis. No immediate need for imaging or physical therapy.  - Consider exercises for trochanteric bursitis.  - Consider physical therapy if symptoms persist.    Vitamin D  deficiency  Takes 2000 IU of vitamin D  two to three times a week.  - Continue current vitamin D  supplementation regimen.  - Ordered blood work to check vitamin D  levels.    Vitamin B12 deficiency  Managed with sublingual B12 supplementation. Takes 500 mcg three to four times a week, maintaining normal levels.  - Continue current B12 supplementation regimen.  - Ordered blood work to check B12 levels.    General health maintenance  Due for routine screenings including Pap smear and mammogram. Discussed the importance of these screenings despite  low risk factors. Also discussed the need for vaccinations, including DPT and shingles.  - Administered DPT vaccine today.  - Ordered shingles vaccine and instructed patient to schedule with nurse.  - Will schedule Pap smear and mammogram.              ICD-10-CM    1. Need for shingles vaccine  Z23 ZOSTER RECOMBINANT (SHINGRIX) VACCINE, IM      2. Need for Tdap vaccination  Z23 TDAP (ADACEL , BOOSTRIX ) VACCINE, IM      3. Visit for screening mammogram  Z12.31 BREAST MAMMOGRAM SCREENING W/TOMO      4. Low vitamin B12 level  R79.89 Vitamin B12     Folate      5. Elevated BP without diagnosis of hypertension  R03.0 Comprehensive Metabolic Panel     CBC with Differential      6. Vitamin D  deficiency  E55.9 Vitamin D , 25 Hydroxy            SUBJECTIVE      Rebecca Warner is a 64yr old adult.    History of Present Illness  Rebecca Warner is a 64 year old with labile hypertension and a heart murmur who presents for evaluation of blood pressure management and heart murmur.    Labile hypertension  - Home blood pressure readings typically in the high 120s to low 130s systolic and 78 to 85 diastolic  - Elevated blood pressure in stressful situations  - Desires discussion regarding initiation of regular antihypertensive medication  - Currently takes spironolactone  as needed, two to three times per week, often after high-salt  meals or during stress  - Previously used hydrochlorothiazide  but discontinued due to hair loss    Cardiac murmur and valvular abnormalities  - Known heart murmur  - Prior echocardiogram demonstrated mild regurgitation of two valves and mild left atrial enlargement    Thyroid  abnormality and fatigue  - History of thyroid  abnormality on imaging  - Intermittent fatigue  - Maternal history of thyroid  deficiency  - Consumes seaweed daily in an effort to support thyroid  health    Hip pain  - Hip pain occurs when sitting still, such as while watching TV or eating  - Hip is comfortable with movement  - Uncertain if  etiology is muscular or related to bursitis    Medication use  - Current medications include spironolactone  as needed, sublingual B12, vitamin D , near-daily Claritin  for allergies, and occasional aspirin for aches  - No longer using albuterol , Flonase , or progesterone       OBJECTIVE      Her height is 1.6 m (5' 3) and weight is 61.8 kg (136 lb 3.9 oz). Her temporal temperature is 37.1 C (98.7 F). Her blood pressure is 154/90 (abnormal).      TIP  Pertinent interpretation of Labs, Imaging, History, and/or Social Determinants related to medical decision making:15640         Gen: Well appearing, no distress  HEENT: bilateral TMs clear, no nasal swelling or drainage, mouth & oropharynx clear  NECK: supple, without lymphadenopathy or thyromegaly  Heart: Regular rate & rhythm without murmur  Lungs: clear to auscultation      Physical Exam  VITALS: BP- 156/90  CARDIOVASCULAR: Normal heart rate and rhythm, S1 and S2 normal without murmurs.    Results  Radiology  Thyroid  ultrasound (03/2023): No nodules, no significant enlargement; follow-up recommended.    Diagnostic  Echocardiogram (03/2023): Mild mitral and aortic regurgitation, mild left atrial enlargement, within normal limits.        TIP  Pertinent discussion and/or review from another source (a different division/facility/family member):15640    Total time I spent in care of this patient today (excluding time spent on other billable services) was *** minutes.     I obtained verbal consent from the patient to use AI ambient technology to transcribe the interactions between the patient and myself during the clinical encounter.    Depression   pull in screening ijuj:[id number]    Patient's Score PHQ-9 Score: 1 and  [id number]      suicide risk documentation:15430     depression treatment plan:15828.         history review:10509   education:10548::Barriers to Learning assessed: none. Patient verbalizes understanding of teaching and  instructions.  Electronically signed by:  Darice CROME. Fields Oros, MD  [facility]  [facility], [place]

## 2024-11-16 NOTE — Nursing Note (Signed)
Per orders of Dr. Tommy Medal, injection of TDAP given by Lurena Nida, MA.   Patient was not wearing a surgical mask.    Contact, Droplet, Airborne and Neutropenic precautions were followed when caring for the patient. PPE used by provider during encounter: surgical mask, face shield and gloves.  Lurena Nida MA1

## 2024-12-05 ENCOUNTER — Ambulatory Visit: Payer: Self-pay | Admitting: Student in an Organized Health Care Education/Training Program
